# Patient Record
Sex: Female | Born: 1965 | Race: White | Hispanic: No | Marital: Married | Smoking: Never smoker
Health system: Southern US, Community
[De-identification: ages and names within clinical notes are randomized; demographics above are authoritative.]

## PROBLEM LIST (undated history)

## (undated) DIAGNOSIS — Z9889 Other specified postprocedural states: Secondary | ICD-10-CM

## (undated) DIAGNOSIS — T8859XA Other complications of anesthesia, initial encounter: Secondary | ICD-10-CM

## (undated) DIAGNOSIS — R112 Nausea with vomiting, unspecified: Secondary | ICD-10-CM

## (undated) DIAGNOSIS — K200 Eosinophilic esophagitis: Secondary | ICD-10-CM

## (undated) DIAGNOSIS — J189 Pneumonia, unspecified organism: Secondary | ICD-10-CM

## (undated) DIAGNOSIS — I10 Essential (primary) hypertension: Secondary | ICD-10-CM

## (undated) DIAGNOSIS — I719 Aortic aneurysm of unspecified site, without rupture: Secondary | ICD-10-CM

## (undated) DIAGNOSIS — F32A Depression, unspecified: Secondary | ICD-10-CM

## (undated) DIAGNOSIS — M797 Fibromyalgia: Secondary | ICD-10-CM

## (undated) DIAGNOSIS — H539 Unspecified visual disturbance: Secondary | ICD-10-CM

## (undated) DIAGNOSIS — R197 Diarrhea, unspecified: Secondary | ICD-10-CM

## (undated) DIAGNOSIS — G43909 Migraine, unspecified, not intractable, without status migrainosus: Secondary | ICD-10-CM

## (undated) HISTORY — DX: Aortic aneurysm of unspecified site, without rupture: I71.9

## (undated) HISTORY — DX: Migraine, unspecified, not intractable, without status migrainosus: G43.909

## (undated) HISTORY — DX: Diarrhea, unspecified: R19.7

## (undated) HISTORY — DX: Fibromyalgia: M79.7

## (undated) HISTORY — DX: Unspecified visual disturbance: H53.9

## (undated) HISTORY — DX: Depression, unspecified: F32.A

## (undated) HISTORY — PX: OTHER SURGICAL HISTORY: SHX169

## (undated) HISTORY — PX: ABDOMINAL HYSTERECTOMY: SHX81

## (undated) HISTORY — DX: Essential (primary) hypertension: I10

## (undated) HISTORY — PX: LAMINECTOMY: SHX219

---
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
## 1991-11-19 HISTORY — PX: APPENDECTOMY: SHX54

## 2002-11-18 HISTORY — PX: KNEE ARTHROSCOPY: SHX127

## 2002-11-18 HISTORY — PX: REPLACEMENT TOTAL KNEE: SUR1224

## 2009-06-26 DIAGNOSIS — M5030 Other cervical disc degeneration, unspecified cervical region: Secondary | ICD-10-CM

## 2009-06-26 DIAGNOSIS — A6000 Herpesviral infection of urogenital system, unspecified: Secondary | ICD-10-CM | POA: Insufficient documentation

## 2009-06-26 DIAGNOSIS — F3189 Other bipolar disorder: Secondary | ICD-10-CM

## 2009-06-26 DIAGNOSIS — M1990 Unspecified osteoarthritis, unspecified site: Secondary | ICD-10-CM | POA: Insufficient documentation

## 2009-06-26 DIAGNOSIS — K922 Gastrointestinal hemorrhage, unspecified: Secondary | ICD-10-CM

## 2009-06-26 DIAGNOSIS — G43909 Migraine, unspecified, not intractable, without status migrainosus: Secondary | ICD-10-CM | POA: Insufficient documentation

## 2009-06-26 DIAGNOSIS — M797 Fibromyalgia: Secondary | ICD-10-CM

## 2009-06-26 DIAGNOSIS — N958 Other specified menopausal and perimenopausal disorders: Secondary | ICD-10-CM

## 2009-06-26 HISTORY — DX: Other specified menopausal and perimenopausal disorders: N95.8

## 2009-06-26 HISTORY — DX: Herpesviral infection of urogenital system, unspecified: A60.00

## 2009-06-26 HISTORY — DX: Unspecified osteoarthritis, unspecified site: M19.90

## 2009-06-26 HISTORY — DX: Other bipolar disorder: F31.89

## 2009-06-26 HISTORY — DX: Migraine, unspecified, not intractable, without status migrainosus: G43.909

## 2009-06-26 HISTORY — DX: Gastrointestinal hemorrhage, unspecified: K92.2

## 2009-06-26 HISTORY — DX: Fibromyalgia: M79.7

## 2009-06-26 HISTORY — DX: Other cervical disc degeneration, unspecified cervical region: M50.30

## 2009-09-18 DIAGNOSIS — Z0000 Encounter for general adult medical examination without abnormal findings: Secondary | ICD-10-CM

## 2009-09-18 HISTORY — DX: Encounter for general adult medical examination without abnormal findings: Z00.00

## 2010-03-18 HISTORY — PX: OTHER SURGICAL HISTORY: SHX169

## 2010-05-23 DIAGNOSIS — I820 Budd-Chiari syndrome: Secondary | ICD-10-CM

## 2010-05-23 HISTORY — DX: Budd-Chiari syndrome: I82.0

## 2011-05-08 DIAGNOSIS — Z79899 Other long term (current) drug therapy: Secondary | ICD-10-CM

## 2011-05-08 HISTORY — DX: Other long term (current) drug therapy: Z79.899

## 2011-08-15 DIAGNOSIS — K120 Recurrent oral aphthae: Secondary | ICD-10-CM

## 2011-08-15 HISTORY — DX: Recurrent oral aphthae: K12.0

## 2011-09-10 DIAGNOSIS — E039 Hypothyroidism, unspecified: Secondary | ICD-10-CM

## 2011-09-10 HISTORY — DX: Hypothyroidism, unspecified: E03.9

## 2012-10-14 DIAGNOSIS — L709 Acne, unspecified: Secondary | ICD-10-CM

## 2012-10-14 HISTORY — DX: Acne, unspecified: L70.9

## 2012-11-18 HISTORY — PX: KNEE ARTHROSCOPY: SUR90

## 2012-11-18 HISTORY — PX: REPLACEMENT TOTAL KNEE: SUR1224

## 2013-02-03 DIAGNOSIS — G935 Compression of brain: Secondary | ICD-10-CM

## 2013-02-03 DIAGNOSIS — G243 Spasmodic torticollis: Secondary | ICD-10-CM

## 2013-02-03 HISTORY — DX: Spasmodic torticollis: G24.3

## 2013-02-03 HISTORY — DX: Compression of brain: G93.5

## 2013-11-18 HISTORY — PX: CARDIAC VALVE SURGERY: SHX40

## 2015-10-19 DIAGNOSIS — S060X9A Concussion with loss of consciousness of unspecified duration, initial encounter: Secondary | ICD-10-CM

## 2015-10-19 DIAGNOSIS — S060XAA Concussion with loss of consciousness status unknown, initial encounter: Secondary | ICD-10-CM

## 2015-10-19 HISTORY — DX: Concussion with loss of consciousness status unknown, initial encounter: S06.0XAA

## 2015-10-19 HISTORY — DX: Concussion with loss of consciousness of unspecified duration, initial encounter: S06.0X9A

## 2015-11-01 DIAGNOSIS — S069XAA Unspecified intracranial injury with loss of consciousness status unknown, initial encounter: Secondary | ICD-10-CM | POA: Insufficient documentation

## 2015-11-01 DIAGNOSIS — S069X9A Unspecified intracranial injury with loss of consciousness of unspecified duration, initial encounter: Secondary | ICD-10-CM | POA: Insufficient documentation

## 2015-11-01 HISTORY — DX: Unspecified intracranial injury with loss of consciousness status unknown, initial encounter: S06.9XAA

## 2015-11-01 HISTORY — DX: Unspecified intracranial injury with loss of consciousness of unspecified duration, initial encounter: S06.9X9A

## 2015-11-09 ENCOUNTER — Ambulatory Visit
Admission: RE | Admit: 2015-11-09 | Discharge: 2015-11-09 | Disposition: A | Discharge status: Home / Self Care | Payer: PRIVATE HEALTH INSURANCE | Source: Ambulatory Visit | Attending: Family Medicine | Admitting: Family Medicine

## 2015-11-09 DIAGNOSIS — R2681 Unsteadiness on feet: Secondary | ICD-10-CM | POA: Insufficient documentation

## 2015-11-09 DIAGNOSIS — S060X1D Concussion with loss of consciousness of 30 minutes or less, subsequent encounter: Secondary | ICD-10-CM | POA: Insufficient documentation

## 2015-11-09 DIAGNOSIS — R42 Dizziness and giddiness: Secondary | ICD-10-CM | POA: Insufficient documentation

## 2015-11-09 DIAGNOSIS — F0781 Postconcussional syndrome: Secondary | ICD-10-CM | POA: Insufficient documentation

## 2015-11-09 DIAGNOSIS — G44319 Acute post-traumatic headache, not intractable: Secondary | ICD-10-CM | POA: Insufficient documentation
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
## 2015-11-15 ENCOUNTER — Ambulatory Visit
Admission: RE | Admit: 2015-11-15 | Discharge: 2015-11-15 | Disposition: A | Discharge status: Home / Self Care | Payer: PRIVATE HEALTH INSURANCE | Source: Ambulatory Visit

## 2015-11-15 NOTE — Progress Notes (Signed)
[facility]  Physical Therapy Daily Note    Date: 11/15/2015  Patient: Michele Barajas  MR#: [id number]  Visit #: 2/10, TBD, POC through 02/10/16   Start Time: 0930 to Stop Time: 1029  Total Treatment Time: 54    Treatment Code # of Minutes Treatment Code untimed   Therapeutic Exercise  PT Evaluation    Neuromuscular Re-Ed  PT Re-Evaluation    Manual Therapy  Canalith Repositioning    Therapeutic Activities 54             Subjective: Patient is agreeable to participation in the therapy session.   I am doing better.  Post Concussive Symptom Scale (Pain assessment 0-6/6 scale):      Symptom Date   11/15/15 Date Date   Headache 4     Nausea 0     Vomitting 0     Balance problems 2     Dizziness 2 (getting up, moving around, climbing stairs)     Lightheadedness 2     Fatigue 3     Trouble falling asleep 0     Sleeping more than usual 2     Sleeping less than usual 0     Drowsiness 1     Sensitivity to light 0     Sensitivity to noise 0     Irritability 4     Sadness 0     Nervous / Anxious 0     Feeling more emotional 3     Numbness or tingling 0     Feeling slowed down 0     Difficulty concentrating 0     Difficulty remembering 3     Visual problems 0     Other      Total Score: 26, previously 66         '  Objective    Therapeutic Activity:    Functional Gait Assessment  Gait level surface 3   Change in gait speed 3   Gait with horizontal head turns 3   Gait with vertical head turns 3   Gait and pivot turns 3   Step over obstacle 3   Gait with narrow base of support 3   Gait with eyes closed 2 (veering to the right)   Ambulating backwards 3   Steps 3   Total Score 29/30   Scores < 23/30 indicate increased risk for falls.    Vestibular activities in standing: VOR horizontal / vertical  Cervical kinesthesia activities in seated  Perfect protocol 2.4 through 3.3.  Pt instruction on monitoring symptoms /  Concussion recovery / exertional activities.    Education/Home Exercise Program:  VOR x 1 vertical and  horizontal, Perfect protocol 2.4 through 3.3.. Daily walking 30 min per day monitoring symptoms. Recommended gradual return to driving: getting behind the wheel in the parking lot with husband in the car, driving 47-82 yards.     Assessment: VVAS 4, DHI 12 points   Cervical Kinesthesia Testing:  R rotation WNL degrees JPE  L rotation WNL degrees JPE  Upward tilt WNL degrees JPE  Downward tilt WNL degrees JPE      Increased dizziness with exertional activities.    DVA WNL 1 line loss.  Smooth pursuits WNL  Saccades horizontal WNL  Saccades vertical WNL  VOR x 1 horizontal increased dizziness after 30 sec at 2 Hz 4/10.  VOR x 1 vertical WNL for 30 seconds at 2 Hz    Follow up in two  weeks.    Progress Toward Functional Goals: STGs 3,4,5,6 met.     Patient Requires Continued Skilled Care to: Patient requires continued skilled Physical Therapy intervention to focus on balance, ROM, strength, activity tolerance, motion sensitivity,  to increase patients safety and independence with functional tasks.    Plan: continue with further treatment to focus on balance, gait, community mobility, bed mobility, transfers, ADL's, UE functioning, pain management, home exercise program  Continue per above Plan of care    Therapist Signature: Osie Cheeks, DPT  FA#[phone]

## 2015-11-23 ENCOUNTER — Ambulatory Visit: Payer: PRIVATE HEALTH INSURANCE

## 2015-11-23 ENCOUNTER — Ambulatory Visit: Payer: PRIVATE HEALTH INSURANCE | Admitting: Nurse Practitioner

## 2015-11-29 ENCOUNTER — Ambulatory Visit
Admission: RE | Admit: 2015-11-29 | Discharge: 2015-11-29 | Disposition: A | Discharge status: Home / Self Care | Payer: PRIVATE HEALTH INSURANCE | Source: Ambulatory Visit | Attending: Family Medicine | Admitting: Family Medicine

## 2015-11-29 DIAGNOSIS — R2681 Unsteadiness on feet: Secondary | ICD-10-CM | POA: Insufficient documentation

## 2015-11-29 DIAGNOSIS — G44319 Acute post-traumatic headache, not intractable: Secondary | ICD-10-CM | POA: Insufficient documentation

## 2015-11-29 DIAGNOSIS — R42 Dizziness and giddiness: Secondary | ICD-10-CM | POA: Insufficient documentation

## 2015-11-29 DIAGNOSIS — F0781 Postconcussional syndrome: Secondary | ICD-10-CM | POA: Insufficient documentation

## 2015-11-29 NOTE — Progress Notes (Signed)
[facility]  Physical Therapy Daily Note    Date: 11/29/2015  Patient: Michele Barajas  MR#: [id number]  Visit #: 3/10, auth TBD, POC through 02/10/16    Start Time: 1401 to Stop Time: 1435  Total Treatment Time: 30    Treatment Code # of Minutes Treatment Code untimed   Therapeutic Exercise  PT Evaluation    Neuromuscular Re-Ed  PT Re-Evaluation    Manual Therapy  Canalith Repositioning    Therapeutic Activities 30             Subjective: Patient is agreeable to participation in the therapy session.   Pt went to a wedding, long drive, and came back with significantly increased symptoms. Took over a week to recover. Pt reports her head is hurting again and it is worse. "it was massive overload", "yesterday was the first day I was able to eat a meal, things did not have any taste to them"        Post Concussive Symptom Scale (Pain assessment 0-6/6 scale):     Symptom Date   11/15/15 Date  11/29/15 Date   Headache 4 4.5    Nausea 0 0    Vomitting 0 0    Balance problems 2 4 (when walking)    Dizziness 2 (getting up, moving around, climbing stairs) 3.5    Lightheadedness 2 3.5    Fatigue 3 5.5    Trouble falling asleep 0 0    Sleeping more than usual 2 6 (pt sleeping close to 20 hours a day)    Sleeping less than usual 0 0    Drowsiness 1 4    Sensitivity to light 0 4    Sensitivity to noise 0 4    Irritability 4 6    Sadness 0 4    Nervous / Anxious 0 4.5    Feeling more emotional 3 4.5    Numbness or tingling 0 0    Feeling slowed down 0 3    Difficulty concentrating 0 4    Difficulty remembering 3 5    Visual problems 0 0    Other      Total Score: 26, previously 49 70                Objective    Therapeutic Activities:  Instruction on concussion recovery, activity modifications and pacing, pt instructed in decreasing activity level for one week with no shopping or going out to busy environments and decreasing  overall activity level, emphasized importance of daily walks twice daily for 15 min each time and deep breathing 3-4 times a day for 5 min, re-educated pt on nutritional recommendations for concussion recovery.       Education/Home Exercise Program:  Continue as given prior.    Assessment: Smooth pursuit, saccades, ROM impaired with pt decrease tolerance to activity, eyes watering, frequent blinking, report of object not appearing in focus and moving, increased eye fatigue with all activities. VOR   / motion sensitivity not assessed due to pt not tolerating today.     Progress Toward Functional Goals: working on LTGs 2 and 5.   Continue STG/LTG    Patient Requires Continued Skilled Care to: Patient requires continued skilled Physical Therapy intervention to focus on balance, motion sensitivity, oculomotor control and gaze stability to increase patients safety and independence with functional tasks.    Plan: continue with further treatment to focus on balance, gait, community mobility, bed mobility, transfers, ADL's, UE functioning, pain  management, home exercise program  Continue per above Plan of care    Therapist Signature: Osie Cheeks, DPT  ZO#[id number]

## 2015-12-19 NOTE — Progress Notes (Signed)
[facility]  Physical Therapy Daily Note    Date: 12/20/2015  Patient: Michele Barajas  MR#: [id number]  Visit #: 3/10, 30 visits allowed, POC through 02/10/16    Start Time: 1615 to Stop Time: 1648  Total Treatment Time: 25    Treatment Code # of Minutes Treatment Code untimed   Therapeutic Exercise  PT Evaluation    Neuromuscular Re-Ed  PT Re-Evaluation    Manual Therapy  Canalith Repositioning    Therapeutic Activities 25             Subjective: Patient is agreeable to participation in the therapy session.   Pt reports she is feeling confused, unable to remember how to get to the clinic . When trying to concentrate on regular daily things, unable to figure out daily IADLs: balancing check book, looking at billing statements.      Post Concussion Symptoms Scale 0 - 6  Symptom Date   11/15/15 Date  11/29/15 Date  12/20/15   Headache 4 4.5 5-6   Nausea 0 0 0   Vomitting 0 0 0   Balance problems 2 4 (when walking) 5   Dizziness 2 (getting up, moving around, climbing stairs) 3.5 5 (Pt reporting oscillopsia)    Lightheadedness 2 3.5 3   Fatigue 3 5.5 6   Trouble falling asleep 0 0 5   Sleeping more than usual 2 6 (pt sleeping close to 20 hours a day) 4 (sometimes sleeps the whole day)   Sleeping less than usual 0 0 4 (waking every hour, unable to fall asleep)   Drowsiness 1 4 5-6   Sensitivity to light 0 4 6   Sensitivity to noise 0 4 6   Irritability 4 6 3    Sadness 0 4 5   Nervous / Anxious 0 4.5 5   Feeling more emotional 3 4.5 3   Numbness or tingling 0 0 0   Feeling slowed down 0 3 4   Difficulty concentrating 0 4 6   Difficulty remembering 3 5 6    Visual problems 0 0 5 (seeing objects move)   Other      Total Score: 26, previously 49 70 94                     Objective    Therapeutic Activity: Continued education  on pacing activity, concussion recovery, importance of nutrition and activity restrictions after concussion    Education/Home Exercise Program: continue with current program     Assessment: Pt with significant increase in symptoms of headache for the last two weeks, was referred to Neurology for symptom management. Pt to return back to PT once symptoms of headache are more controlled. Pt to re-start daily walking when able / symtoms of headache are managed.     Progress Toward Functional Goals: working today on STGs 1 and 2.     Patient Requires Continued Skilled Care to: Patient requires continued skilled Physical Therapy intervention to focus on balance, ROM, strength, activity tolerance, motion sensitivity, to increase patients safety and independence with functional tasks.    Plan: continue with further treatment to focus on balance, gait, community mobility, bed mobility, transfers, ADL's, UE functioning, pain management, home exercise program  Continue per above Plan of care    Therapist Signature: Osie Cheeks, DPT [phone]#[id number]

## 2015-12-20 ENCOUNTER — Ambulatory Visit
Admission: RE | Admit: 2015-12-20 | Discharge: 2015-12-20 | Disposition: A | Discharge status: Home / Self Care | Payer: PRIVATE HEALTH INSURANCE | Source: Ambulatory Visit | Attending: Family Medicine | Admitting: Family Medicine

## 2015-12-20 DIAGNOSIS — R42 Dizziness and giddiness: Secondary | ICD-10-CM | POA: Insufficient documentation

## 2015-12-20 DIAGNOSIS — F0781 Postconcussional syndrome: Secondary | ICD-10-CM | POA: Insufficient documentation

## 2015-12-20 DIAGNOSIS — S060X1D Concussion with loss of consciousness of 30 minutes or less, subsequent encounter: Secondary | ICD-10-CM | POA: Insufficient documentation

## 2015-12-20 DIAGNOSIS — G44319 Acute post-traumatic headache, not intractable: Secondary | ICD-10-CM | POA: Insufficient documentation

## 2015-12-20 DIAGNOSIS — R2681 Unsteadiness on feet: Secondary | ICD-10-CM | POA: Insufficient documentation

## 2016-03-04 ENCOUNTER — Ambulatory Visit
Admission: RE | Admit: 2016-03-04 | Discharge: 2016-03-04 | Disposition: A | Discharge status: Home / Self Care | Payer: PRIVATE HEALTH INSURANCE | Source: Ambulatory Visit

## 2016-03-15 ENCOUNTER — Other Ambulatory Visit: Payer: Self-pay | Admitting: Critical Care Medicine

## 2016-03-15 DIAGNOSIS — J019 Acute sinusitis, unspecified: Secondary | ICD-10-CM

## 2016-04-17 ENCOUNTER — Ambulatory Visit: Payer: Self-pay

## 2016-04-23 ENCOUNTER — Ambulatory Visit
Admission: RE | Admit: 2016-04-23 | Discharge: 2016-04-23 | Disposition: A | Discharge status: Home / Self Care | Payer: Commercial Managed Care - POS | Source: Ambulatory Visit | Attending: Specialist | Admitting: Specialist

## 2016-04-23 DIAGNOSIS — S060X0D Concussion without loss of consciousness, subsequent encounter: Secondary | ICD-10-CM | POA: Insufficient documentation

## 2016-04-23 DIAGNOSIS — R41840 Attention and concentration deficit: Secondary | ICD-10-CM | POA: Insufficient documentation

## 2016-04-23 DIAGNOSIS — R41841 Cognitive communication deficit: Secondary | ICD-10-CM | POA: Insufficient documentation

## 2016-04-23 DIAGNOSIS — R41844 Frontal lobe and executive function deficit: Secondary | ICD-10-CM | POA: Insufficient documentation
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
## 2016-05-02 ENCOUNTER — Ambulatory Visit
Admission: RE | Admit: 2016-05-02 | Discharge: 2016-05-02 | Disposition: A | Discharge status: Home / Self Care | Payer: Commercial Managed Care - POS | Source: Ambulatory Visit

## 2016-05-02 NOTE — Progress Notes (Signed)
Speech Language Pathology Daily Note    Date: 05/02/2016  Patient: Michele Barajas  MR#: [id number]  Visit #: 2/12; Cert: 03/21/980 (60 Visits)    Start Time: 0930 to Stop Time: 1030  Total Treatment Time: 55    Subjective:   Patient is agreeable to participation in the therapy session. Patient arrived on time. No c/o pain. Pt reports that anxiety is reduced during this session due to companionship of emotional service dog.     Objective:         Cognition:  -pt reports decreased ability to complete functional tasks such as simple math, organizational skill (sorting paperwork), following conversations, etc.    Compensatory strategies:  -pt actively participates in education regarding attention and strategies to lesson the cognitive burden of tasks  -pt demonstrated current use of strategies   -pt informed about Brain Injury Services and will receive materials in the following session     Assessment: Patient presents with mod-sev cognitive deficits c/b decreased short term, long term, and working memory, thought organization, attention, and executive functioning. Pt with recent increase in anxiety regarding deficits. Pt participates in education of strategies well. Pt has decreased anxiety during today's session and stated that she was more positive about the outcome of therapy. Pt stated that she will inform extended family of the circumstances of communicative and cognitive therapy services now that she understands her status more fully.     Progress Toward Functional Goals: Patient is making progress toward cognitive-linguistic goals and memory/executive functioning/attention goals. Continue STG/LTG    Patient Requires Continued Skilled Care to:   Improve communication skills to allow patient to participate in community activities. Improve cognitive-linguistic skills to increase independence with implementation of compensatory strategies. Use compensatory techniques independently.    Plan/Education/Home Exercise  Program:  Continue with language exercises, cognitive linguistic exercises, and implementation of compensatory strategies. Continue speech follow up 2x per week.    Therapist Signature: Odella Aquas, SLP Student       I was present for and directed the plan of care for the patient.     Vanice Sarah, MS, CCC-SLP

## 2016-05-07 ENCOUNTER — Ambulatory Visit
Admission: RE | Admit: 2016-05-07 | Discharge: 2016-05-07 | Disposition: A | Discharge status: Home / Self Care | Payer: Commercial Managed Care - POS | Source: Ambulatory Visit

## 2016-05-07 NOTE — Progress Notes (Signed)
Speech Language Pathology Daily Note    Date: 05/07/2016  Patient: Michele Barajas  MR#: [id number]  Visit #: 3/12; Cert: 03/24/8468 (60 Visits)    Start Time: 1030 to Stop Time: 1130  Total Treatment Time: 55    Subjective:   Patient is agreeable to participation in the therapy session. Patient arrived on time. Pt reports that fatigue has been significantly higher this past weekend. Pt reports that headache increased during the session.    Objective:         Cognition:  -pt reports decreased ability to complete functional tasks such as simple math, organizational skill (sorting paperwork), following conversations, etc.     Compensatory strategies:  -pt actively participates in education regarding attention, executive functions and strategies to lesson the cognitive burden of tasks  -pt demonstrated current use of strategies to aid in executive functions and attention tasks   -pt provided with information about Brain Injury Services      Assessment: Patient presents with mod-sev cognitive deficits c/b decreased short term, long term, and working memory, thought organization, attention, and executive functioning. Pt with recent increase in anxiety regarding deficits. Pt participates in education of strategies well. Pt reported a feeling of guilt due to the inability to complete tasks at home. Pt reported a confusion about current level of functioning and was informed of the progression of therapy.     Progress Toward Functional Goals: Patient is making progress toward cognitive-linguistic goals and memory/executive functioning/attention goals. Continue STG/LTG    Patient Requires Continued Skilled Care to:   Improve communication skills to allow patient to participate in community activities. Improve cognitive-linguistic skills to increase independence with implementation of compensatory strategies. Use compensatory techniques independently.    Plan/Education/Home Exercise Program:  Continue with language exercises,  cognitive linguistic exercises, and implementation of compensatory strategies. Continue speech follow up 2x per week.    Therapist Signature: Odella Aquas, SLP Student       I was present for and directed the plan of care for the patient.     Vanice Sarah, MS, CCC-SLP

## 2016-05-09 ENCOUNTER — Ambulatory Visit
Admission: RE | Admit: 2016-05-09 | Discharge: 2016-05-09 | Disposition: A | Discharge status: Home / Self Care | Payer: Commercial Managed Care - POS | Source: Ambulatory Visit

## 2016-05-14 ENCOUNTER — Ambulatory Visit
Admission: RE | Admit: 2016-05-14 | Discharge: 2016-05-14 | Disposition: A | Discharge status: Home / Self Care | Payer: Commercial Managed Care - POS | Source: Ambulatory Visit

## 2016-05-14 NOTE — Progress Notes (Signed)
Speech Language Pathology Daily Note    Date: 05/14/2016  Patient: Michele Barajas  MR#: [id number]  Visit #: 4/12; Cert: 03/21/980 (60 Visits)    Start Time: 1030 to Stop Time: 1130  Total Treatment Time: 56    Subjective:   Patient is agreeable to participation in the therapy session. Patient arrived on time. Pt visibly upset following discussion of prognosis with other medical professional.     Objective:         Cognition:  -pt reports decreased ability to complete functional tasks such as simple math, organizational skill (sorting paperwork), following conversations, cleaning house to her standards, etc.     Compensatory strategies:  -pt actively participates in education regarding attention, executive functions and strategies to lesson the cognitive burden of tasks  -pt began creating list for tasks that need to be completed prior to moving in upcoming months  -pt required max cueing to organize thoughts and utilize compensatory strategies (writing down tasks)    Assessment: Patient presents with mod-sev cognitive deficits c/b decreased short term, long term, and working memory, thought organization, attention, and executive functioning. Pt with recent increase in anxiety regarding deficits. Pt participates in education of strategies well. Pt reported a feeling of guilt and stupidity due to the inability to complete tasks at home.     Progress Toward Functional Goals: Patient is making progress toward cognitive-linguistic goals and memory/executive functioning/attention goals. Continue STG/LTG    Patient Requires Continued Skilled Care to:   Improve communication skills to allow patient to participate in community activities. Improve cognitive-linguistic skills to increase independence with implementation of compensatory strategies. Use compensatory techniques independently.    Plan/Education/Home Exercise Program:  Continue with language exercises, cognitive linguistic exercises, and implementation of compensatory  strategies. Continue speech follow up 2x per week.    Therapist Signature: Odella Aquas, SLP Student       I was present for and directed the plan of care for the patient.     Vanice Sarah, MS, CCC-SLP

## 2016-05-16 ENCOUNTER — Ambulatory Visit: Payer: Commercial Managed Care - POS

## 2016-05-23 ENCOUNTER — Ambulatory Visit: Payer: Commercial Managed Care - POS

## 2016-05-28 ENCOUNTER — Ambulatory Visit
Admission: RE | Admit: 2016-05-28 | Discharge: 2016-05-28 | Disposition: A | Discharge status: Home / Self Care | Payer: Commercial Managed Care - POS | Source: Ambulatory Visit | Attending: Specialist | Admitting: Specialist

## 2016-05-28 DIAGNOSIS — S060X0D Concussion without loss of consciousness, subsequent encounter: Secondary | ICD-10-CM | POA: Insufficient documentation

## 2016-05-28 DIAGNOSIS — R41844 Frontal lobe and executive function deficit: Secondary | ICD-10-CM | POA: Insufficient documentation

## 2016-05-28 DIAGNOSIS — R41841 Cognitive communication deficit: Secondary | ICD-10-CM | POA: Insufficient documentation

## 2016-05-28 DIAGNOSIS — R41840 Attention and concentration deficit: Secondary | ICD-10-CM | POA: Insufficient documentation

## 2016-05-28 NOTE — Progress Notes (Signed)
Speech Language Pathology Daily Note    Date: 05/28/2016  Patient: Michele Barajas  MR#: [id number]  Visit #: 5/12; Cert: 12/27/5282 (60 Visits)    Start Time: 1030 to Stop Time: 1130  Total Treatment Time: 56    Subjective:   Patient is agreeable to participation in the therapy session. Patient arrived on time. Pt reported she has been sleeping a lot this week.     Objective:         Treatment Tasks:    Deduction Puzzle: 58% acc independently; 100% acc mod cueing  -pt required break 5 minutes into task and reported 4/10 headache  -pt independently implemented compensatory strategy of crossing out information to reduce cognitive load  -pt benefited from reminders to reread clues    Logic Links (A-3-4 chips): 70% acc independently; 90% acc mod cueing  -pt benefited from visual and verbal cues   -relatively fast processing speed    Fill in the Missing Letters: 66% acc independently; 100% acc mod cueing  Spelling: 83% acc independently; 93% acc mod cueing  -pt's processing and completion speed increased throughout task execution  -pt benefited from semantic and phonemic cues  -pt demonstrated sense of accomplishment upon completion of task and reported 0/10 headache following task    Compensatory strategies:  -pt actively participates in education regarding attention, executive functions and strategies to lesson the cognitive burden of tasks  -pt marks off tasks that were completed during the week and continues to prepare for move at the end of the month     Assessment: Patient presents with mod-sev cognitive deficits c/b decreased short term, long term, and working memory, thought organization, attention, and executive functioning. Pt with recent increase in anxiety regarding deficits. Pt participates in education of strategies well. Pt reported inability to make Thursday appointments due to fatigue from week. Pt attempts to get later time slot on Thursdays and will plan to cancel Thursday appointments until later slot  becomes available.     Progress Toward Functional Goals: Patient is making progress toward cognitive-linguistic goals and memory/executive functioning/attention goals. Continue STG/LTG    Patient Requires Continued Skilled Care to:   Improve communication skills to allow patient to participate in community activities. Improve cognitive-linguistic skills to increase independence with implementation of compensatory strategies. Use compensatory techniques independently.    Plan/Education/Home Exercise Program:  Continue with language exercises, cognitive linguistic exercises, and implementation of compensatory strategies. Continue speech follow up 2x per week.    Therapist Signature: Odella Aquas, SLP Student       I was present for and directed the plan of care for the patient.     Vanice Sarah, MS, CCC-SLP

## 2016-05-30 ENCOUNTER — Ambulatory Visit: Payer: Commercial Managed Care - POS

## 2016-06-04 ENCOUNTER — Ambulatory Visit
Admission: RE | Admit: 2016-06-04 | Discharge: 2016-06-04 | Disposition: A | Discharge status: Home / Self Care | Payer: Commercial Managed Care - POS | Source: Ambulatory Visit

## 2016-06-04 NOTE — Progress Notes (Signed)
Speech Language Pathology Daily Note    Date: 06/04/2016  Patient: Michele Barajas  MR#: [id number]  Visit #: 6/12; Cert: 03/21/980 (60 Visits)    Start Time: 1030 to Stop Time: 1130  Total Treatment Time: 55    Subjective:   Patient is agreeable to participation in the therapy session. Patient arrived on time. "I've been sleeping a lot" Pt demonstrated higher level of energy and more positive attitude during session.     Objective:         Treatment Tasks:    Logic Links (A): 70% acc independently; 90% acc mod cueing  -pt benefited from visual and verbal cues   -relatively fast processing speed  -headache stayed at a 5/10 reported at the beginning of the session    [place] (3-Step): 83% acc independently; 100% acc min cueing  -pt benefited from reminders to check her work  -"its hard to think backwards and vertically at the same time"  -fast processing   -pt reported that her headache increased to a 7/10    Compensatory strategies:  -pt actively participates in education regarding attention, executive functions and strategies to lesson the cognitive burden of tasks  -pt creates "Win List" to document the successes in each session and throughout the day, as a reminder of the progress on the "bad days"      Assessment: Patient presents with mod-sev cognitive deficits c/b decreased short term, long term, and working memory, thought organization, attention, and executive functioning. Pt with recent increase in anxiety regarding deficits. Pt participates in education of strategies well. Pt reported that she is started to see some progress, but that it is hard to see the "light at the end of the tunnel". Pt educated on the growth that she has made and the importance of realizing her progress. Pt reported that she is "wiped out" after sessions and then educated on the cognitive demand during treatment. Pt demonstrated understanding of fatigue triggers and purpose of tasks.     Progress Toward Functional Goals: Patient  is making progress toward cognitive-linguistic goals and memory/executive functioning/attention goals. Continue STG/LTG    Patient Requires Continued Skilled Care to:   Improve communication skills to allow patient to participate in community activities. Improve cognitive-linguistic skills to increase independence with implementation of compensatory strategies. Use compensatory techniques independently.    Plan/Education/Home Exercise Program:  Continue with language exercises, cognitive linguistic exercises, and implementation of compensatory strategies. Continue speech follow up 2x per week.    Therapist Signature: Odella Aquas, SLP Student       I was present for and directed the plan of care for the patient.     Vanice Sarah, MS, CCC-SLP

## 2016-06-06 ENCOUNTER — Ambulatory Visit: Payer: Commercial Managed Care - POS

## 2016-06-11 ENCOUNTER — Ambulatory Visit: Payer: Commercial Managed Care - POS

## 2016-06-13 ENCOUNTER — Ambulatory Visit: Payer: Commercial Managed Care - POS

## 2016-06-13 ENCOUNTER — Ambulatory Visit
Admission: RE | Admit: 2016-06-13 | Discharge: 2016-06-13 | Disposition: A | Discharge status: Home / Self Care | Payer: Commercial Managed Care - POS | Source: Ambulatory Visit

## 2016-06-18 ENCOUNTER — Ambulatory Visit
Admission: RE | Admit: 2016-06-18 | Discharge: 2016-06-18 | Disposition: A | Discharge status: Home / Self Care | Payer: Commercial Managed Care - POS | Source: Ambulatory Visit | Attending: Specialist | Admitting: Specialist

## 2016-06-18 DIAGNOSIS — S060X0D Concussion without loss of consciousness, subsequent encounter: Secondary | ICD-10-CM | POA: Insufficient documentation

## 2016-06-18 DIAGNOSIS — R41841 Cognitive communication deficit: Secondary | ICD-10-CM | POA: Insufficient documentation

## 2016-06-18 DIAGNOSIS — R41844 Frontal lobe and executive function deficit: Secondary | ICD-10-CM | POA: Insufficient documentation

## 2016-06-18 DIAGNOSIS — R41840 Attention and concentration deficit: Secondary | ICD-10-CM | POA: Insufficient documentation

## 2016-06-18 NOTE — Progress Notes (Signed)
Speech Language Pathology Daily Note    Date: 06/18/2016  Patient: Michele Barajas  MR#: [id number]  Visit #: 1/47; Cert: 06/19/9561 (60 Visits)    Start Time: 1030 to Stop Time: 1130  Total Treatment Time: 55    Subjective:   Patient is agreeable to participation in the therapy session. Patient arrived on time. Pt reports headache of 7/10, appears very tired. Pt reports for the past two weeks, her headaches have not been below a 7/10, mostly up to 9-10/10.     Objective:         Treatment Tasks:    Compensatory strategies:  -pt is currently in the process of moving houses; reports extremely high levels of stress  -pt educated on importance of taking breaks throughout the day to reduce headaches and other cognitive triggers    To do list:  -pt requires max cueing to write down to-do list instead of repeating verbally  -educated to set timer when completing tasks to ensure not wasting time  -educated regarding strategy to simplify tasks to complete them    Assessment: Patient presents with mod-sev cognitive deficits c/b decreased short term, long term, and working memory, thought organization, attention, and executive functioning. Pt with high levels of stress at this time which are affecting overall cognition. Pt appropriately attends to education regarding compensatory strategies.    Progress Toward Functional Goals: Patient is making progress toward cognitive-linguistic goals and memory/executive functioning/attention goals. Continue STG/LTG    Patient Requires Continued Skilled Care to:   Improve communication skills to allow patient to participate in community activities. Improve cognitive-linguistic skills to increase independence with implementation of compensatory strategies. Use compensatory techniques independently.    Plan/Education/Home Exercise Program:  Continue with language exercises, cognitive linguistic exercises, and implementation of compensatory strategies. Continue speech follow up 2x per  week.    Therapist Signature: Vanice Sarah, MS, CCC-SLP

## 2016-06-20 ENCOUNTER — Ambulatory Visit: Payer: Commercial Managed Care - POS

## 2016-06-25 ENCOUNTER — Ambulatory Visit: Payer: Commercial Managed Care - POS

## 2016-06-27 ENCOUNTER — Ambulatory Visit: Payer: Commercial Managed Care - POS

## 2016-07-02 ENCOUNTER — Ambulatory Visit: Payer: Commercial Managed Care - POS

## 2016-07-04 ENCOUNTER — Ambulatory Visit: Payer: Commercial Managed Care - POS

## 2016-07-09 ENCOUNTER — Ambulatory Visit: Payer: Commercial Managed Care - POS

## 2016-07-11 ENCOUNTER — Ambulatory Visit: Payer: Commercial Managed Care - POS

## 2016-07-16 ENCOUNTER — Ambulatory Visit: Payer: Commercial Managed Care - POS

## 2016-07-16 ENCOUNTER — Emergency Department: Payer: Commercial Managed Care - POS

## 2016-07-16 ENCOUNTER — Inpatient Hospital Stay: Payer: Commercial Managed Care - POS | Admitting: Internal Medicine

## 2016-07-16 ENCOUNTER — Inpatient Hospital Stay
Admission: EM | Admit: 2016-07-16 | Discharge: 2016-07-18 | DRG: 392 | Disposition: A | Discharge status: Home / Self Care | Payer: Commercial Managed Care - POS | Attending: Internal Medicine | Admitting: Internal Medicine

## 2016-07-16 DIAGNOSIS — K529 Noninfective gastroenteritis and colitis, unspecified: Secondary | ICD-10-CM

## 2016-07-16 DIAGNOSIS — D72829 Elevated white blood cell count, unspecified: Secondary | ICD-10-CM

## 2016-07-16 DIAGNOSIS — M797 Fibromyalgia: Secondary | ICD-10-CM | POA: Diagnosis present

## 2016-07-16 DIAGNOSIS — Z800 Family history of malignant neoplasm of digestive organs: Secondary | ICD-10-CM

## 2016-07-16 DIAGNOSIS — F4310 Post-traumatic stress disorder, unspecified: Secondary | ICD-10-CM | POA: Diagnosis present

## 2016-07-16 HISTORY — DX: Noninfective gastroenteritis and colitis, unspecified: K52.9

## 2016-07-16 HISTORY — DX: Elevated white blood cell count, unspecified: D72.829

## 2016-07-16 LAB — CBC AND DIFFERENTIAL
Absolute NRBC: 0 10*3/uL
Basophils Absolute Automated: 0.04 10*3/uL (ref 0.00–0.20)
Basophils Automated: 0.2 %
Eosinophils Absolute Automated: 0.01 10*3/uL (ref 0.00–0.70)
Eosinophils Automated: 0.1 %
Hematocrit: 41.1 % (ref 37.0–47.0)
Hgb: 14.2 g/dL (ref 12.0–16.0)
Immature Granulocytes Absolute: 0.11 10*3/uL — ABNORMAL HIGH
Immature Granulocytes: 0.6 %
Lymphocytes Absolute Automated: 0.76 10*3/uL (ref 0.50–4.40)
Lymphocytes Automated: 3.9 %
MCH: 32 pg (ref 28.0–32.0)
MCHC: 34.5 g/dL (ref 32.0–36.0)
MCV: 92.6 fL (ref 80.0–100.0)
MPV: 10 fL (ref 9.4–12.3)
Monocytes Absolute Automated: 1.56 10*3/uL — ABNORMAL HIGH (ref 0.00–1.20)
Monocytes: 8.1 %
Neutrophils Absolute: 16.83 10*3/uL — ABNORMAL HIGH (ref 1.80–8.10)
Neutrophils: 87.1 %
Nucleated RBC: 0 /100 WBC (ref 0.0–1.0)
Platelets: 251 10*3/uL (ref 140–400)
RBC: 4.44 10*6/uL (ref 4.20–5.40)
RDW: 12 % (ref 12–15)
WBC: 19.31 10*3/uL — ABNORMAL HIGH (ref 3.50–10.80)

## 2016-07-16 LAB — PT AND APTT
PT INR: 1
PT: 12.8 s (ref 12.6–15.0)
PTT: 28 s (ref 23–37)

## 2016-07-16 LAB — COMPREHENSIVE METABOLIC PANEL
ALT: 47 U/L (ref 0–55)
AST (SGOT): 48 U/L — ABNORMAL HIGH (ref 5–34)
Albumin/Globulin Ratio: 1.3 (ref 0.9–2.2)
Albumin: 3.5 g/dL (ref 3.5–5.0)
Alkaline Phosphatase: 141 U/L — ABNORMAL HIGH (ref 37–106)
Anion Gap: 10 (ref 5.0–15.0)
BUN: 20.7 mg/dL — ABNORMAL HIGH (ref 7.0–19.0)
Bilirubin, Total: 0.2 mg/dL (ref 0.2–1.2)
CO2: 26 mEq/L (ref 22–29)
Calcium: 8.9 mg/dL (ref 8.5–10.5)
Chloride: 103 mEq/L (ref 100–111)
Creatinine: 0.8 mg/dL (ref 0.6–1.0)
Globulin: 2.6 g/dL (ref 2.0–3.6)
Glucose: 124 mg/dL — ABNORMAL HIGH (ref 70–100)
Potassium: 4.6 mEq/L (ref 3.5–5.1)
Protein, Total: 6.1 g/dL (ref 6.0–8.3)
Sodium: 139 mEq/L (ref 136–145)

## 2016-07-16 LAB — URINALYSIS, REFLEX TO MICROSCOPIC EXAM IF INDICATED
Bilirubin, UA: NEGATIVE
Blood, UA: NEGATIVE
Glucose, UA: NEGATIVE
Ketones UA: NEGATIVE
Leukocyte Esterase, UA: NEGATIVE
Nitrite, UA: NEGATIVE
Protein, UR: NEGATIVE
Specific Gravity UA: 1.016 (ref 1.001–1.035)
Urine pH: 7 (ref 5.0–8.0)
Urobilinogen, UA: NORMAL mg/dL

## 2016-07-16 LAB — HEMOGLOBIN AND HEMATOCRIT, BLOOD
Hematocrit: 39.4 % (ref 37.0–47.0)
Hgb: 13.7 g/dL (ref 12.0–16.0)

## 2016-07-16 LAB — TYPE AND SCREEN
AB Screen Gel: NEGATIVE
ABO Rh: O POS

## 2016-07-16 LAB — GFR: EGFR: >60

## 2016-07-16 LAB — LIPASE: Lipase: <4 U/L — ABNORMAL LOW (ref 8–78)

## 2016-07-16 MED ORDER — ONDANSETRON HCL 4 MG/2ML IJ SOLN
4.0000 mg | Freq: Four times a day (QID) | INTRAMUSCULAR | Status: DC | PRN
Start: 2016-07-16 — End: 2016-07-18
  Administered 2016-07-16 – 2016-07-18 (×6): 4 mg via INTRAVENOUS
  Filled 2016-07-16 (×6): qty 2

## 2016-07-16 MED ORDER — ACETAMINOPHEN 325 MG PO TABS
650.0000 mg | ORAL_TABLET | Freq: Four times a day (QID) | ORAL | Status: DC | PRN
Start: 2016-07-16 — End: 2016-07-18
  Administered 2016-07-17: 650 mg via ORAL
  Filled 2016-07-16: qty 2

## 2016-07-16 MED ORDER — HYDROMORPHONE HCL 0.5 MG/0.5 ML IJ SOLN
0.5000 mg | INTRAMUSCULAR | Status: DC | PRN
Start: 2016-07-16 — End: 2016-07-16
  Filled 2016-07-16: qty 0.5

## 2016-07-16 MED ORDER — FAMOTIDINE 20 MG PO TABS
20.0000 mg | ORAL_TABLET | Freq: Two times a day (BID) | ORAL | Status: DC
Start: 2016-07-16 — End: 2016-07-18
  Administered 2016-07-16 – 2016-07-18 (×4): 20 mg via ORAL
  Filled 2016-07-16 (×4): qty 1

## 2016-07-16 MED ORDER — FAMOTIDINE 10 MG/ML IV SOLN (WRAP)
20.0000 mg | Freq: Two times a day (BID) | INTRAVENOUS | Status: DC
Start: 2016-07-16 — End: 2016-07-18
  Filled 2016-07-16 (×2): qty 2

## 2016-07-16 MED ORDER — PANTOPRAZOLE SODIUM 40 MG IV SOLR
40.0000 mg | Freq: Every day | INTRAVENOUS | Status: DC
Start: 2016-07-16 — End: 2016-07-16

## 2016-07-16 MED ORDER — DICYCLOMINE HCL 10 MG/ML IM SOLN
10.0000 mg | Freq: Once | INTRAMUSCULAR | Status: AC
Start: 2016-07-16 — End: 2016-07-16
  Administered 2016-07-16: 10 mg via INTRAMUSCULAR
  Filled 2016-07-16: qty 2

## 2016-07-16 MED ORDER — METRONIDAZOLE IN NACL 500 MG/100 ML IV SOLN
500.0000 mg | Freq: Once | INTRAVENOUS | Status: DC
Start: 2016-07-16 — End: 2016-07-16
  Filled 2016-07-16: qty 100

## 2016-07-16 MED ORDER — SODIUM CHLORIDE 0.9 % IV SOLN
INTRAVENOUS | Status: DC
Start: 2016-07-16 — End: 2016-07-18

## 2016-07-16 MED ORDER — IOHEXOL 350 MG/ML IV SOLN
100.0000 mL | Freq: Once | INTRAVENOUS | Status: AC | PRN
Start: 2016-07-16 — End: 2016-07-16
  Administered 2016-07-16: 100 mL via INTRAVENOUS

## 2016-07-16 MED ORDER — MORPHINE SULFATE 4 MG/ML IJ/IV SOLN (WRAP)
Status: AC
Start: 2016-07-16 — End: 2016-07-16
  Administered 2016-07-16: 4 mg via INTRAVENOUS
  Filled 2016-07-16: qty 1

## 2016-07-16 MED ORDER — MORPHINE SULFATE 4 MG/ML IJ/IV SOLN (WRAP)
4.0000 mg | Freq: Once | Status: AC
Start: 2016-07-16 — End: 2016-07-16
  Administered 2016-07-16: 4 mg via INTRAVENOUS
  Filled 2016-07-16: qty 1

## 2016-07-16 MED ORDER — MORPHINE SULFATE 4 MG/ML IJ/IV SOLN (WRAP)
4.0000 mg | Status: DC | PRN
Start: 2016-07-16 — End: 2016-07-18
  Administered 2016-07-16 – 2016-07-18 (×2): 4 mg via INTRAVENOUS
  Filled 2016-07-16 (×2): qty 1

## 2016-07-16 MED ORDER — SERTRALINE HCL 50 MG PO TABS
75.0000 mg | ORAL_TABLET | Freq: Every day | ORAL | Status: DC
Start: 2016-07-16 — End: 2016-07-18
  Administered 2016-07-16 – 2016-07-18 (×3): 75 mg via ORAL
  Filled 2016-07-16 (×3): qty 2

## 2016-07-16 MED ORDER — ACETAMINOPHEN 650 MG RE SUPP
650.0000 mg | RECTAL | Status: DC | PRN
Start: 2016-07-16 — End: 2016-07-18

## 2016-07-16 MED ORDER — LEVOFLOXACIN IN D5W 750 MG/150ML IV SOLN
750.0000 mg | Freq: Once | INTRAVENOUS | Status: DC
Start: 2016-07-16 — End: 2016-07-16

## 2016-07-16 MED ORDER — ACYCLOVIR 800 MG PO TABS
400.0000 mg | ORAL_TABLET | Freq: Two times a day (BID) | ORAL | Status: DC
Start: 2016-07-16 — End: 2016-07-18
  Administered 2016-07-16 – 2016-07-18 (×5): 400 mg via ORAL
  Filled 2016-07-16 (×9): qty 1

## 2016-07-16 MED ORDER — ONDANSETRON HCL 4 MG/2ML IJ SOLN
4.0000 mg | Freq: Four times a day (QID) | INTRAMUSCULAR | Status: DC | PRN
Start: 2016-07-16 — End: 2016-07-16

## 2016-07-16 MED ORDER — ONDANSETRON 4 MG PO TBDP
4.0000 mg | ORAL_TABLET | Freq: Four times a day (QID) | ORAL | Status: DC | PRN
Start: 2016-07-16 — End: 2016-07-18
  Filled 2016-07-16: qty 1

## 2016-07-16 MED ORDER — BUPROPION HCL ER (XL) 150 MG PO TB24
300.0000 mg | ORAL_TABLET | Freq: Every day | ORAL | Status: DC
Start: 2016-07-16 — End: 2016-07-18
  Administered 2016-07-17 – 2016-07-18 (×2): 300 mg via ORAL
  Filled 2016-07-16 (×2): qty 2
  Filled 2016-07-16: qty 1

## 2016-07-16 MED ORDER — CLONAZEPAM 0.5 MG PO TABS
1.0000 mg | ORAL_TABLET | Freq: Three times a day (TID) | ORAL | Status: DC
Start: 2016-07-16 — End: 2016-07-18
  Administered 2016-07-16 – 2016-07-18 (×6): 1 mg via ORAL
  Filled 2016-07-16 (×6): qty 2

## 2016-07-16 MED ORDER — MORPHINE SULFATE 2 MG/ML IJ/IV SOLN (WRAP)
2.0000 mg | Status: DC | PRN
Start: 2016-07-16 — End: 2016-07-16

## 2016-07-16 MED ORDER — DICYCLOMINE HCL 10 MG PO CAPS
10.0000 mg | ORAL_CAPSULE | Freq: Four times a day (QID) | ORAL | Status: DC
Start: 2016-07-16 — End: 2016-07-18
  Administered 2016-07-16 – 2016-07-18 (×8): 10 mg via ORAL
  Filled 2016-07-16 (×9): qty 1

## 2016-07-16 MED ORDER — LAMOTRIGINE 100 MG PO TABS
200.0000 mg | ORAL_TABLET | Freq: Two times a day (BID) | ORAL | Status: DC
Start: 2016-07-16 — End: 2016-07-18
  Administered 2016-07-16 – 2016-07-18 (×5): 200 mg via ORAL
  Filled 2016-07-16 (×6): qty 2

## 2016-07-16 MED ORDER — SODIUM CHLORIDE 0.9 % IV BOLUS
1000.0000 mL | Freq: Once | INTRAVENOUS | Status: AC
Start: 2016-07-16 — End: 2016-07-16
  Administered 2016-07-16: 1000 mL via INTRAVENOUS

## 2016-07-16 MED ORDER — NALOXONE HCL 0.4 MG/ML IJ SOLN (WRAP)
0.2000 mg | INTRAMUSCULAR | Status: DC | PRN
Start: 2016-07-16 — End: 2016-07-18

## 2016-07-16 MED ORDER — ACETAMINOPHEN 325 MG PO TABS
650.0000 mg | ORAL_TABLET | ORAL | Status: DC | PRN
Start: 2016-07-16 — End: 2016-07-18

## 2016-07-16 MED ORDER — RISAQUAD PO CAPS
1.0000 | ORAL_CAPSULE | Freq: Every day | ORAL | Status: DC
Start: 2016-07-16 — End: 2016-07-18
  Administered 2016-07-16 – 2016-07-18 (×3): 1 via ORAL
  Filled 2016-07-16 (×3): qty 1

## 2016-07-16 MED ORDER — ONDANSETRON HCL 4 MG/2ML IJ SOLN
4.0000 mg | Freq: Once | INTRAMUSCULAR | Status: AC
Start: 2016-07-16 — End: 2016-07-16
  Administered 2016-07-16: 4 mg via INTRAVENOUS
  Filled 2016-07-16: qty 2

## 2016-07-16 MED ORDER — HYDROMORPHONE HCL 1 MG/ML IJ SOLN
0.5000 mg | INTRAMUSCULAR | Status: DC | PRN
Start: 2016-07-16 — End: 2016-07-17
  Administered 2016-07-16 – 2016-07-17 (×3): 0.5 mg via INTRAVENOUS
  Filled 2016-07-16 (×3): qty 1

## 2016-07-16 NOTE — ED Provider Notes (Signed)
Physician/Midlevel provider first contact with patient: 07/16/16 1228         History     Chief Complaint   Patient presents with   . Rectal Bleeding   . Abdominal Pain     HPI Comments: 51 year old female here with abdominal pain, nausea, vomiting and diarrhea.  Last night patient had generalized abdominal pain, nausea, loose watery diarrhea.  This morning.  The diarrhea has gone to bright red blood per rectum and abdominal pain has worsened.  Patient was seen at urgent care, had lab work, IV fluid and given Demerol for pain and sent here for further evaluation.  Patient denies any recent sick contact, travel or antibiotic use.  Pain is diffuse but does not radiate to the back.  She denies any chest pain or shortness of breath.    The history is provided by the patient and the spouse. No language interpreter was used.            Past Medical History   Diagnosis Date   . Concussion    . Fibromyalgia        Past Surgical History   Procedure Laterality Date   . Hysterectomy     . Knee surgery     . Cervical fusion     . Ascending aortic aneurysm repair         History reviewed. No pertinent family history.    Social  Social History   Substance Use Topics   . Smoking status: Never Smoker    . Smokeless tobacco: Never Used   . Alcohol Use: Yes      Comment: socialluy       .     Allergies   Allergen Reactions   . Fentanyl        Home Medications     Last Medication Reconciliation Action:  In Progress Bryna Colander, RN 07/16/2016 12:29 PM                  buPROPion (WELLBUTRIN) 75 MG tablet     Take 75 mg by mouth 2 (two) times daily.     clonazePAM (KLONOPIN) 1 MG tablet     Take 1 mg by mouth 2 (two) times daily as needed.     lamoTRIgine (LAMICTAL) 100 MG tablet     Take 100 mg by mouth daily.     sertraline (ZOLOFT) 100 MG tablet     Take 100 mg by mouth daily.           Review of Systems   Constitutional: Negative.  Negative for fever and fatigue.   HENT: Negative.    Eyes: Negative.    Respiratory: Negative.   Negative for chest tightness and shortness of breath.    Cardiovascular: Negative.  Negative for chest pain.   Gastrointestinal: Positive for nausea, vomiting, abdominal pain, diarrhea and anal bleeding.   Genitourinary: Negative.    Musculoskeletal: Negative.    Skin: Negative.    Neurological: Negative.    Hematological: Negative.    Psychiatric/Behavioral: Negative.    All other systems reviewed and are negative.      Physical Exam    BP: 138/62 mmHg, Heart Rate: 80, Temp: 97.7 F (36.5 C), Resp Rate: 20, SpO2: 95 %, Weight: 90.719 kg    Physical Exam   Constitutional: She is oriented to person, place, and time. She appears well-developed and well-nourished.   HENT:   Head: Normocephalic and atraumatic.   Neck: Normal range of motion.  Neck supple.   Cardiovascular: Normal rate, regular rhythm, normal heart sounds and intact distal pulses.    Pulmonary/Chest: Effort normal and breath sounds normal.   Abdominal: Soft. Normal appearance and bowel sounds are normal. She exhibits no distension and no mass. There is generalized tenderness. There is no rebound and no guarding. No hernia.   Genitourinary:   Bright red blood   Musculoskeletal: Normal range of motion.   Neurological: She is alert and oriented to person, place, and time.   Skin: Skin is warm and dry.   Psychiatric: She has a normal mood and affect. Her behavior is normal. Judgment and thought content normal.   Nursing note and vitals reviewed.        MDM and ED Course     ED Medication Orders     Start Ordered     Status Ordering Provider    07/16/16 1647 07/16/16 1646  metroNIDAZOLE (FLAGYL) 500 mg IVPB (premix)   Once     Route: Intravenous  Ordered Dose: 500 mg     Ordered Yarissa Reining, [facility]ANG    07/16/16 1647 07/16/16 1646  levoFLOXacin (LEVAQUIN) 750mg  in D5W IVPB (premix)   Once     Route: Intravenous  Ordered Dose: 750 mg     Ordered Keyasha Miah, [facility]ANG    07/16/16 1518 07/16/16 1517  0.9%  NaCl infusion   Continuous     Route: Intravenous      Last MAR action:  New Bag Kameka Whan, [facility]ANG    07/16/16 1455 07/16/16 1454  morphine injection 4 mg   Once     Route: Intravenous  Ordered Dose: 4 mg     Last MAR action:  Given Ozias Dicenzo, [facility]ANG    07/16/16 1237 07/16/16 1236  sodium chloride 0.9 % bolus 1,000 mL   Once     Route: Intravenous  Ordered Dose: 1,000 mL     Last MAR action:  Stopped Tiye Huwe, [facility] HOANG    07/16/16 1237 07/16/16 1236  ondansetron (ZOFRAN) injection 4 mg   Once     Route: Intravenous  Ordered Dose: 4 mg     Last MAR action:  Given Rosemarie Galvis, [facility] HOANG    07/16/16 1237 07/16/16 1236  morphine injection 4 mg   Once     Route: Intravenous  Ordered Dose: 4 mg     Last MAR action:  Given Melonee Gerstel, [facility]ANG    07/16/16 1237 07/16/16 1236  dicyclomine (BENTYL) injection 10 mg   Once     Route: Intramuscular  Ordered Dose: 10 mg     Last MAR action:  Given Kalyse Meharg HOANG             MDM  Number of Diagnoses or Management Options  Hemorrhagic colitis:   Leukocytosis, unspecified type:   Diagnosis management comments: 2:30 PM spoke with Shana NP for GI, recommend treatment with antibiotics with Levaquin and Flagyl.    *This note was generated by the Epic EMR system/ Dragon speech recognition and may contain inherent errors or omissions not intended by the user. Grammatical errors, random word insertions, deletions, pronoun errors and incomplete sentences are occasional consequences of this technology due to software limitations. Not all errors are caught or corrected. If there are questions or concerns about the content of this note or information contained within the body of this dictation they should be addressed directly with the author for clarification    The attending signature signifies review and agreement of  the history , PE, evaluation, clinical impression and discharge plan except as otherwise noted.    I, Prospero Mahnke Albertine Grates, have been the primary provider for Michele Barajas during this Emergency Dept visit.    I reviewed nursing  recorded vitals and history including PMSFHX    Oxygen saturation by pulse oximetry is 95%-100%, Normal.  Interventions: None Needed.    Plan:  Patient will be admitted to the hospital for IV antibiotics, rehydration, pain control, as well as repeat blood work to evaluate for anemia.         Amount and/or Complexity of Data Reviewed  Clinical lab tests: ordered and reviewed  Tests in the radiology section of CPT: ordered and reviewed  Tests in the medicine section of CPT: ordered and reviewed  Review and summarize past medical records: yes  Discuss the patient with other providers: yes    Risk of Complications, Morbidity, and/or Mortality  Presenting problems: high  Diagnostic procedures: high  Management options: high    Patient Progress  Patient progress: stable            Procedures    Results     Procedure Component Value Units Date/Time    Stool for C. diff [[id number]] Collected:  07/16/16 1525    Specimen Information:  Stool from Stool Updated:  07/16/16 1525    Narrative:      Patient's current admission began:->less than or equal to 3  days ago  Cancel C-Diff order if no diarrhea in 12 hours?->Yes  DELAY AFFECTS RESULT    Stool for Salm/Shig/Campy/Shiga PCR [[id number]] Collected:  07/16/16 1525    Specimen Information:  Stool Updated:  07/16/16 1525    UA with reflex to micro (pts  3 + yrs) [[id number]]  (Abnormal) Collected:  07/16/16 1417    Specimen Information:  Urine Updated:  07/16/16 1428     Urine Type Clean Catch      Color, UA Yellow      Clarity, UA Cloudy (A)      Specific Gravity UA 1.016      Urine pH 7.0      Leukocyte Esterase, UA Negative      Nitrite, UA Negative      Protein, UR Negative      Glucose, UA Negative      Ketones UA Negative      Urobilinogen, UA Normal mg/dL      Bilirubin, UA Negative      Blood, UA Negative     Type and Screen [[id number]] Collected:  07/16/16 1258    Specimen Information:  Blood Updated:  07/16/16 1418     ABO Rh O POS      AB Screen Gel NEG     PT/APTT  [[id number]] Collected:  07/16/16 1258     PT 12.8 sec Updated:  07/16/16 1327     PT INR 1.0      PT Anticoag. Given Within 48 hrs. None      PTT 28 sec     Comprehensive metabolic panel [[id number]]  (Abnormal) Collected:  07/16/16 1258    Specimen Information:  Blood Updated:  07/16/16 1322     Glucose 124 (H) mg/dL      BUN 74.2 (H) mg/dL      Creatinine 0.8 mg/dL      Sodium 595 mEq/L      Potassium 4.6 mEq/L      Chloride 103 mEq/L      CO2 26 mEq/L  Calcium 8.9 mg/dL      Protein, Total 6.1 g/dL      Albumin 3.5 g/dL      AST (SGOT) 48 (H) U/L      ALT 47 U/L      Alkaline Phosphatase 141 (H) U/L      Bilirubin, Total 0.2 mg/dL      Globulin 2.6 g/dL      Albumin/Globulin Ratio 1.3      Anion Gap 10.0     Lipase [[id number]]  (Abnormal) Collected:  07/16/16 1258    Specimen Information:  Blood Updated:  07/16/16 1322     Lipase <4 (L) U/L     GFR [[id number]] Collected:  07/16/16 1258     EGFR >60.0 Updated:  07/16/16 1322    CBC with differential [[id number]]  (Abnormal) Collected:  07/16/16 1258    Specimen Information:  Blood from Blood Updated:  07/16/16 1307     WBC 19.31 (H) x10 3/uL      Hgb 14.2 g/dL      Hematocrit 25.9 %      Platelets 251 x10 3/uL      RBC 4.44 x10 6/uL      MCV 92.6 fL      MCH 32.0 pg      MCHC 34.5 g/dL      RDW 12 %      MPV 10.0 fL      Neutrophils 87.1 %      Lymphocytes Automated 3.9 %      Monocytes 8.1 %      Eosinophils Automated 0.1 %      Basophils Automated 0.2 %      Immature Granulocyte 0.6 %      Nucleated RBC 0.0 /100 WBC      Neutrophils Absolute 16.83 (H) x10 3/uL      Abs Lymph Automated 0.76 x10 3/uL      Abs Mono Automated 1.56 (H) x10 3/uL      Abs Eos Automated 0.01 x10 3/uL      Absolute Baso Automated 0.04 x10 3/uL      Absolute Immature Granulocyte 0.11 (H) x10 3/uL      Absolute NRBC 0.00 x10 3/uL           Radiology Results (24 Hour)     Procedure Component Value Units Date/Time    CT Abd/Pelvis with IV Contrast only [[id number]] Collected:  07/16/16 1406     Order Status:  Completed Updated:  07/16/16 1414    Narrative:      Indication: Abdominal pain.    Procedure: Contrast-enhanced CT of abdomen and pelvis. 100 mL of  Omnipaque 350 were administered intravenously. No oral contrast  material.    Findings: There is severe thickening of the wall of the colon involving  the entirety of the colon. There are no abnormally dilated colonic  loops. Minimal stranding of the pericolonic fatty tissue. No colonic  diverticula are identified. No evidence of diverticulitis. The appendix  is not identified. There is no evidence of appendicitis, however. No  dilated small bowel loops. No free intraperitoneal gas or free fluid.  The uterus is absent. No adnexal masses or cystic lesions are seen.  Unremarkable liver and gallbladder. No dilatation of the biliary tree.  Unremarkable spleen, pancreas, adrenals, kidneys, and abdominal aorta.  Normal cardiac size. Sternal wires are present. There is an old, healed  fracture of the posterolateral portion of the left seventh rib.  Impression:        1. Severe diffuse colonic wall thickening consistent with colitis. No  evidence of diverticulitis.  2. Hysterectomy.    Wynema Birch, MD   07/16/2016 2:10 PM            I have reviewed all labs and/or radiological studies. I have reviewed all xrays if any myself on the PACS system.    Clinical Impression & Disposition     Clinical Impression  Final diagnoses:   Hemorrhagic colitis   Leukocytosis, unspecified type        ED Disposition     Observation Admitting Physician: Bertrum Sol [[id number]]  Diagnosis: Colitis [130865]  Estimated Length of Stay: < 2 midnights  Tentative Discharge Plan?: Home or Self Care [1]  Patient Class: Observation [104]             New Prescriptions    No medications on file                   Carney Harder [place], [place]  07/16/16 1647    Kalman Drape, MD  07/20/16 1355

## 2016-07-16 NOTE — ED Notes (Signed)
Weapons denied

## 2016-07-16 NOTE — Progress Notes (Signed)
Received pt from ER via stretcher. Pt oriented to room and call bell. No Telemetry orders at this time.

## 2016-07-16 NOTE — H&P (Addendum)
[facility] HOSPITALIST  H&P  Patient Info:   Date/Time: 07/16/2016 / 6:56 PM   Admit Date:07/16/2016  Patient Name:Michele Barajas   IHK:[id number]   PCP: Christa See, MD  Attending Physician:Wallo, Rolly Pancake, MD     Assessment and Plan:   50 year old with past medical history of PTSD, fibromyalgia, comes in with half a day of severe abdominal cramps, nausea, vomiting and hemorrhagic diarrhea with CT evidence of diffuse colitis     ID: Hemorrhagic Colitis with nausea, vomiting: Likely infectious etiology; continue supportive care with IV fluids, antiemetics.  Hold off on IV antibiotics for now. Discussed case with Dr Janalyn Rouse who informed me that there are a lot of cases of Shigella going around and if abx are started pt could be at risk of HUS.  If pt gets worse, will consult Dr Janalyn Rouse.   Check stool cultures for Shigella, Salmonella and Campylobacter.  Check C. difficile as well.  Placed on clear liquids for now   Inadequate pain control: Diffuse cramping pain from above: Start Dilaudid 0.5 every 4 when necessary   Leukocytosis due to above.  We will recheck tomorrow        DVT Prohylaxis:SEDs   Code Status: No Order  Disposition:home  Type of Admission:Observation  Estimated Length of Stay (including stay in the ER receiving treatment): 1-2  Medical Necessity for stay: Hemorrhagic colitis  Hospital Problems:   Active Problems:    Colitis    Clinical Presentation:   History of Presenting Illness:   Michele Barajas is a 50 y.o. female who has history of   Past Surgical History   Procedure Laterality Date   . Hysterectomy     . Knee surgery     . Cervical fusion     . Ascending aortic aneurysm repair      Past Medical History   Diagnosis Date   . Concussion    . Fibromyalgia     came with the chief complaint of Rectal Bleeding and Abdominal Pain  Symptoms started 1 AM with severe nausea, vomiting and diarrhea.  Diarrhea persisted.  Has had multiple episodes over 30.  Diarrhea, change to  bloody form.  Also complaining of severe cramping pain in her lower abdomen.  No fever, chills, no chest pain or difficulty breathing.  Did not eat anything unusual yesterday ate [employer].  No other sick contacts  Review of Systems:   Review of Systems   Constitutional: Positive for malaise/fatigue. Negative for fever and chills.   Respiratory: Negative for cough, hemoptysis, sputum production and shortness of breath.    Cardiovascular: Negative for chest pain and palpitations.   Gastrointestinal: Positive for nausea, vomiting, abdominal pain, diarrhea and blood in stool.   Neurological: Positive for weakness.     Physical Exam:     Filed Vitals:    07/16/16 1634 07/16/16 1636 07/16/16 1755 07/16/16 1820   BP:  118/70  137/71   Pulse:  76  93   Temp:  98 F (36.7 C)  98.2 F (36.8 C)   TempSrc:  Temporal Artery  Temporal Artery   Resp:  18  18   Height:       Weight:       SpO2: 96%  95% 93%     Physical Exam   Constitutional: She is oriented to person, place, and time and well-developed, well-nourished, and in no distress.   HENT:   Mouth/Throat: Mucous membranes are dry.   Cardiovascular: Normal rate and regular  rhythm.    Pulmonary/Chest: No respiratory distress. She has no wheezes. She has no rales.   Abdominal: Soft. Bowel sounds are normal. She exhibits no distension. There is tenderness. There is no rebound.   Tender on deep palpation in lower abdomen   Musculoskeletal: She exhibits no edema.   Neurological: She is alert and oriented to person, place, and time.     Clinical Information and History:   Chief Complaint:  Chief Complaint   Patient presents with   . Rectal Bleeding   . Abdominal Pain     Past Medical History:  Past Medical History   Diagnosis Date   . Concussion    . Fibromyalgia      Past Surgical History:  Past Surgical History   Procedure Laterality Date   . Hysterectomy     . Knee surgery     . Cervical fusion     . Ascending aortic aneurysm repair       Family History:Parents had colon  cancer  Social History:  History   Alcohol Use   . Yes     Comment: socialluy     History   Drug Use No     History   Smoking status   . Never Smoker    Smokeless tobacco   . Never Used     Social History     Social History   . Marital Status: Married     Spouse Name: N/A   . Number of Children: N/A   . Years of Education: N/A     Social History Main Topics   . Smoking status: Never Smoker    . Smokeless tobacco: Never Used   . Alcohol Use: Yes      Comment: socialluy   . Drug Use: No   . Sexual Activity: Not Asked     Other Topics Concern   . None     Social History Narrative   . None     Allergies:  Allergies   Allergen Reactions   . Fentanyl      Medications:  (Not in a hospital admission)  Results of Labs/imaging   Labs have been reviewed:   Coagulation Profile:   Recent Labs  Lab 07/16/16  1258   PT 12.8   PT INR 1.0   PTT 28       CBC review:   Recent Labs  Lab 07/16/16  1258   WBC 19.31*   HGB 14.2   HEMATOCRIT 41.1   PLATELETS 251   MCV 92.6   RDW 12   NEUTROPHILS 87.1   LYMPHOCYTES AUTOMATED 3.9   EOSINOPHILS AUTOMATED 0.1   IMMATURE GRANULOCYTE 0.6   NEUTROPHILS ABSOLUTE 16.83*   ABSOLUTE IMMATURE GRANULOCYTE 0.11*     Chem Review:  Recent Labs  Lab 07/16/16  1258   SODIUM 139   POTASSIUM 4.6   CHLORIDE 103   CO2 26   BUN 20.7*   CREATININE 0.8   GLUCOSE 124*   CALCIUM 8.9   BILIRUBIN, TOTAL 0.2   AST (SGOT) 48*   ALT 47   ALKALINE PHOSPHATASE 141*     Results     Procedure Component Value Units Date/Time    Stool for C. diff [[id number]] Collected:  07/16/16 1525    Specimen Information:  Stool from Stool Updated:  07/16/16 1525    Narrative:      Patient's current admission began:->less than or equal to 3  days ago  Cancel C-Diff order  if no diarrhea in 12 hours?->Yes  DELAY AFFECTS RESULT    Stool for Salm/Shig/Campy/Shiga PCR [[id number]] Collected:  07/16/16 1525    Specimen Information:  Stool Updated:  07/16/16 1525    UA with reflex to micro (pts  3 + yrs) [[id number]]  (Abnormal) Collected:  07/16/16  1417    Specimen Information:  Urine Updated:  07/16/16 1428     Urine Type Clean Catch      Color, UA Yellow      Clarity, UA Cloudy (A)      Specific Gravity UA 1.016      Urine pH 7.0      Leukocyte Esterase, UA Negative      Nitrite, UA Negative      Protein, UR Negative      Glucose, UA Negative      Ketones UA Negative      Urobilinogen, UA Normal mg/dL      Bilirubin, UA Negative      Blood, UA Negative     Type and Screen [[id number]] Collected:  07/16/16 1258    Specimen Information:  Blood Updated:  07/16/16 1418     ABO Rh O POS      AB Screen Gel NEG     PT/APTT [[id number]] Collected:  07/16/16 1258     PT 12.8 sec Updated:  07/16/16 1327     PT INR 1.0      PT Anticoag. Given Within 48 hrs. None      PTT 28 sec     Comprehensive metabolic panel [[id number]]  (Abnormal) Collected:  07/16/16 1258    Specimen Information:  Blood Updated:  07/16/16 1322     Glucose 124 (H) mg/dL      BUN 74.2 (H) mg/dL      Creatinine 0.8 mg/dL      Sodium 595 mEq/L      Potassium 4.6 mEq/L      Chloride 103 mEq/L      CO2 26 mEq/L      Calcium 8.9 mg/dL      Protein, Total 6.1 g/dL      Albumin 3.5 g/dL      AST (SGOT) 48 (H) U/L      ALT 47 U/L      Alkaline Phosphatase 141 (H) U/L      Bilirubin, Total 0.2 mg/dL      Globulin 2.6 g/dL      Albumin/Globulin Ratio 1.3      Anion Gap 10.0     Lipase [[id number]]  (Abnormal) Collected:  07/16/16 1258    Specimen Information:  Blood Updated:  07/16/16 1322     Lipase <4 (L) U/L     GFR [[id number]] Collected:  07/16/16 1258     EGFR >60.0 Updated:  07/16/16 1322    CBC with differential [[id number]]  (Abnormal) Collected:  07/16/16 1258    Specimen Information:  Blood from Blood Updated:  07/16/16 1307     WBC 19.31 (H) x10 3/uL      Hgb 14.2 g/dL      Hematocrit 09.3 %      Platelets 251 x10 3/uL      RBC 4.44 x10 6/uL      MCV 92.6 fL      MCH 32.0 pg      MCHC 34.5 g/dL      RDW 12 %      MPV 10.0 fL      Neutrophils 87.1 %  Lymphocytes Automated 3.9 %      Monocytes 8.1 %       Eosinophils Automated 0.1 %      Basophils Automated 0.2 %      Immature Granulocyte 0.6 %      Nucleated RBC 0.0 /100 WBC      Neutrophils Absolute 16.83 (H) x10 3/uL      Abs Lymph Automated 0.76 x10 3/uL      Abs Mono Automated 1.56 (H) x10 3/uL      Abs Eos Automated 0.01 x10 3/uL      Absolute Baso Automated 0.04 x10 3/uL      Absolute Immature Granulocyte 0.11 (H) x10 3/uL      Absolute NRBC 0.00 x10 3/uL         Radiology reports have been reviewed:  Radiology Results (24 Hour)     Procedure Component Value Units Date/Time    CT Abd/Pelvis with IV Contrast only [[id number]] Collected:  07/16/16 1406    Order Status:  Completed Updated:  07/16/16 1414    Narrative:      Indication: Abdominal pain.    Procedure: Contrast-enhanced CT of abdomen and pelvis. 100 mL of  Omnipaque 350 were administered intravenously. No oral contrast  material.    Findings: There is severe thickening of the wall of the colon involving  the entirety of the colon. There are no abnormally dilated colonic  loops. Minimal stranding of the pericolonic fatty tissue. No colonic  diverticula are identified. No evidence of diverticulitis. The appendix  is not identified. There is no evidence of appendicitis, however. No  dilated small bowel loops. No free intraperitoneal gas or free fluid.  The uterus is absent. No adnexal masses or cystic lesions are seen.  Unremarkable liver and gallbladder. No dilatation of the biliary tree.  Unremarkable spleen, pancreas, adrenals, kidneys, and abdominal aorta.  Normal cardiac size. Sternal wires are present. There is an old, healed  fracture of the posterolateral portion of the left seventh rib.      Impression:        1. Severe diffuse colonic wall thickening consistent with colitis. No  evidence of diverticulitis.  2. Hysterectomy.    Wynema Birch, MD   07/16/2016 2:10 PM          EKG: Not done  Last EKG Result     None           Hospitalist   Signed by:   Bertrum Sol  07/16/2016 6:56  PM    *This note was generated by the Epic EMR system/ Dragon speech recognition and may contain inherent errors or omissions not intended by the user. Grammatical errors, random word insertions, deletions, pronoun errors and incomplete sentences are occasional consequences of this technology due to software limitations. Not all errors are caught or corrected. If there are questions or concerns about the content of this note or information contained within the body of this dictation they should be addressed directly with the author for clarification

## 2016-07-17 ENCOUNTER — Encounter (INDEPENDENT_AMBULATORY_CARE_PROVIDER_SITE_OTHER): Payer: Self-pay

## 2016-07-17 ENCOUNTER — Observation Stay: Payer: Commercial Managed Care - POS

## 2016-07-17 DIAGNOSIS — D72829 Elevated white blood cell count, unspecified: Secondary | ICD-10-CM | POA: Insufficient documentation

## 2016-07-17 LAB — CBC AND DIFFERENTIAL
Absolute NRBC: 0 10*3/uL
Absolute NRBC: 0 10*3/uL
Basophils Absolute Automated: 0.04 10*3/uL (ref 0.00–0.20)
Basophils Absolute Automated: 0.03 10*3/uL (ref 0.00–0.20)
Basophils Automated: 0.2 %
Basophils Automated: 0.2 %
Eosinophils Absolute Automated: 0.01 10*3/uL (ref 0.00–0.70)
Eosinophils Absolute Automated: 0.03 10*3/uL (ref 0.00–0.70)
Eosinophils Automated: 0.1 %
Eosinophils Automated: 0.2 %
Hematocrit: 38.7 % (ref 37.0–47.0)
Hematocrit: 37.3 % (ref 37.0–47.0)
Hgb: 13.2 g/dL (ref 12.0–16.0)
Hgb: 12.8 g/dL (ref 12.0–16.0)
Immature Granulocytes Absolute: 0.1 10*3/uL — ABNORMAL HIGH
Immature Granulocytes Absolute: 0.13 10*3/uL — ABNORMAL HIGH
Immature Granulocytes: 0.5 %
Immature Granulocytes: 0.7 %
Lymphocytes Absolute Automated: 0.86 10*3/uL (ref 0.50–4.40)
Lymphocytes Absolute Automated: 1.16 10*3/uL (ref 0.50–4.40)
Lymphocytes Automated: 4.5 %
Lymphocytes Automated: 6.2 %
MCH: 31.9 pg (ref 28.0–32.0)
MCH: 32.1 pg — ABNORMAL HIGH (ref 28.0–32.0)
MCHC: 34.1 g/dL (ref 32.0–36.0)
MCHC: 34.3 g/dL (ref 32.0–36.0)
MCV: 93.5 fL (ref 80.0–100.0)
MCV: 93.5 fL (ref 80.0–100.0)
MPV: 10.3 fL (ref 9.4–12.3)
MPV: 10.1 fL (ref 9.4–12.3)
Monocytes Absolute Automated: 1.67 10*3/uL — ABNORMAL HIGH (ref 0.00–1.20)
Monocytes Absolute Automated: 1.83 10*3/uL — ABNORMAL HIGH (ref 0.00–1.20)
Monocytes: 8.7 %
Monocytes: 9.8 %
Neutrophils Absolute: 16.53 10*3/uL — ABNORMAL HIGH (ref 1.80–8.10)
Neutrophils Absolute: 15.54 10*3/uL — ABNORMAL HIGH (ref 1.80–8.10)
Neutrophils: 86 %
Neutrophils: 82.9 %
Nucleated RBC: 0 /100 WBC (ref 0.0–1.0)
Nucleated RBC: 0 /100 WBC (ref 0.0–1.0)
Platelets: 257 10*3/uL (ref 140–400)
Platelets: 231 10*3/uL (ref 140–400)
RBC: 4.14 10*6/uL — ABNORMAL LOW (ref 4.20–5.40)
RBC: 3.99 10*6/uL — ABNORMAL LOW (ref 4.20–5.40)
RDW: 12 % (ref 12–15)
RDW: 12 % (ref 12–15)
WBC: 19.21 10*3/uL — ABNORMAL HIGH (ref 3.50–10.80)
WBC: 18.72 10*3/uL — ABNORMAL HIGH (ref 3.50–10.80)

## 2016-07-17 LAB — STOOL FOR WBC: Eosinophils Wright Stain: NONE SEEN

## 2016-07-17 LAB — COMPREHENSIVE METABOLIC PANEL
ALT: 42 U/L (ref 0–55)
AST (SGOT): 39 U/L — ABNORMAL HIGH (ref 5–34)
Albumin/Globulin Ratio: 1.3 (ref 0.9–2.2)
Albumin: 3 g/dL — ABNORMAL LOW (ref 3.5–5.0)
Alkaline Phosphatase: 113 U/L — ABNORMAL HIGH (ref 37–106)
Anion Gap: 8 (ref 5.0–15.0)
BUN: 14.5 mg/dL (ref 7.0–19.0)
Bilirubin, Total: 0.2 mg/dL (ref 0.2–1.2)
CO2: 25 mEq/L (ref 22–29)
Calcium: 8.3 mg/dL — ABNORMAL LOW (ref 8.5–10.5)
Chloride: 106 mEq/L (ref 100–111)
Creatinine: 0.7 mg/dL (ref 0.6–1.0)
Globulin: 2.3 g/dL (ref 2.0–3.6)
Glucose: 116 mg/dL — ABNORMAL HIGH (ref 70–100)
Potassium: 4 mEq/L (ref 3.5–5.1)
Protein, Total: 5.3 g/dL — ABNORMAL LOW (ref 6.0–8.3)
Sodium: 139 mEq/L (ref 136–145)

## 2016-07-17 LAB — HEMOGLOBIN AND HEMATOCRIT, BLOOD
Hematocrit: 38.2 % (ref 37.0–47.0)
Hematocrit: 36.7 % — ABNORMAL LOW (ref 37.0–47.0)
Hematocrit: 37.8 % (ref 37.0–47.0)
Hgb: 13.1 g/dL (ref 12.0–16.0)
Hgb: 12.3 g/dL (ref 12.0–16.0)
Hgb: 12.9 g/dL (ref 12.0–16.0)

## 2016-07-17 LAB — HIV AG/AB 4TH GENERATION: HIV Ag/Ab, 4th Generation: NONREACTIVE

## 2016-07-17 LAB — CLOSTRIDIUM DIFFICILE TOXIN B PCR: Stool Clostridium difficile Toxin B PCR: NEGATIVE

## 2016-07-17 LAB — GFR: EGFR: >60

## 2016-07-17 LAB — C-REACTIVE PROTEIN HIGH SENSITIVE: C-Reactive Protein, High Sensitive: 13.85 mg/dL — ABNORMAL HIGH (ref 0.00–0.50)

## 2016-07-17 MED ORDER — PEG 3350-KCL-NABCB-NACL-NASULF 236 G PO SOLR
4000.0000 mL | Freq: Once | ORAL | Status: DC
Start: 2016-07-17 — End: 2016-07-17
  Filled 2016-07-17: qty 4000

## 2016-07-17 MED ORDER — HYDROMORPHONE HCL 1 MG/ML IJ SOLN
1.0000 mg | INTRAMUSCULAR | Status: DC | PRN
Start: 2016-07-17 — End: 2016-07-18
  Administered 2016-07-17 – 2016-07-18 (×7): 1 mg via INTRAVENOUS
  Filled 2016-07-17 (×7): qty 1

## 2016-07-17 MED ORDER — GABAPENTIN 300 MG PO CAPS
600.0000 mg | ORAL_CAPSULE | Freq: Three times a day (TID) | ORAL | Status: DC
Start: 2016-07-17 — End: 2016-07-18
  Administered 2016-07-17 – 2016-07-18 (×3): 600 mg via ORAL
  Filled 2016-07-17 (×3): qty 2

## 2016-07-17 MED ORDER — LEVOFLOXACIN IN D5W 500 MG/100ML IV SOLN
500.0000 mg | INTRAVENOUS | Status: DC
Start: 2016-07-17 — End: 2016-07-18
  Administered 2016-07-17 – 2016-07-18 (×2): 500 mg via INTRAVENOUS
  Filled 2016-07-17 (×2): qty 100

## 2016-07-17 MED ORDER — DIPHENHYDRAMINE HCL 50 MG/ML IJ SOLN
12.5000 mg | INTRAMUSCULAR | Status: DC | PRN
Start: 2016-07-17 — End: 2016-07-18
  Administered 2016-07-17 – 2016-07-18 (×2): 12.5 mg via INTRAVENOUS
  Filled 2016-07-17 (×2): qty 1

## 2016-07-17 MED ORDER — GABAPENTIN 100 MG PO CAPS
100.0000 mg | ORAL_CAPSULE | Freq: Every evening | ORAL | Status: DC | PRN
Start: 2016-07-17 — End: 2016-07-18
  Administered 2016-07-18: 100 mg via ORAL
  Filled 2016-07-17: qty 1

## 2016-07-17 MED ORDER — METRONIDAZOLE IN NACL 500 MG/100 ML IV SOLN
500.0000 mg | Freq: Three times a day (TID) | INTRAVENOUS | Status: DC
Start: 2016-07-17 — End: 2016-07-18
  Administered 2016-07-17 – 2016-07-18 (×5): 500 mg via INTRAVENOUS
  Filled 2016-07-17 (×6): qty 100

## 2016-07-17 MED ORDER — OXCARBAZEPINE 300 MG PO TABS
300.0000 mg | ORAL_TABLET | Freq: Two times a day (BID) | ORAL | Status: DC
Start: 2016-07-17 — End: 2016-07-18
  Administered 2016-07-17 – 2016-07-18 (×3): 300 mg via ORAL
  Filled 2016-07-17 (×3): qty 1

## 2016-07-17 MED FILL — Morphine Sulfate IV Soln PF 10 MG/ML: INTRAVENOUS | Qty: 1 | Status: AC

## 2016-07-17 NOTE — Plan of Care (Signed)
Problem: Pain interferes with ability to perform ADL  Goal: Pain at adequate level as identified by patient  Outcome: Progressing    07/17/16 0052   Goal/Interventions addressed this shift   Pain at adequate level as identified by patient Identify patient comfort function goal;Assess for risk of opioid induced respiratory depression, including snoring/sleep apnea. Alert healthcare team of risk factors identified.;Assess pain on admission, during daily assessment and/or before any "as needed" intervention(s);Reassess pain within 30-60 minutes of any procedure/intervention, per Pain Assessment, Intervention, Reassessment (AIR) Cycle;Evaluate if patient comfort function goal is met;Evaluate patient's satisfaction with pain management progress;Offer non-pharmacological pain management interventions;Include patient/patient care companion in decisions related to pain management as needed   Pt medicated with Dilaudid IV per prn order for abdominal pain with adequate relief.    Problem: Safety  Goal: Patient will be free from injury during hospitalization  Outcome: Progressing    07/17/16 0052   Goal/Interventions addressed this shift   Patient will be free from injury during hospitalization  Assess patient's risk for falls and implement fall prevention plan of care per policy;Provide and maintain safe environment;Use appropriate transfer methods;Ensure appropriate safety devices are available at the bedside;Include patient/ family/ care giver in decisions related to safety;Hourly rounding;Assess for patients risk for elopement and implement Elopement Risk Plan per policy   Bed alarm activated. Call bell and all personal items within reach at all times. Bed kept in lowest position and locked. Pt calls appropriately for assist.

## 2016-07-17 NOTE — Progress Notes (Signed)
[facility] ([facility])    Received a referral to schedule a follow up appointment with the [facility].  Appointment scheduled for Tuesday 07/23/16 at 10:30AM at [facility] location.      Clinic address is as follows:      [address]. [place], [place]. [address] [place], [address]    Please notify patient to arrive 15 minutes early to the appointment and bring the following materials with them:    Insurance card (if insured) and photo ID  Medications in their original bottles  Glucometer/blood sugar log (if diabetic)  Weight log (if heart failure)  Proof of income (to enroll in medication assistance programs-first two pages of signed 1040 tax forms or last 2 months of pay stubs)      Terressa Koyanagi  Transitional Services   Sched Reg Rep II  T [phone]  F [phone]

## 2016-07-17 NOTE — Progress Notes (Signed)
Will hold on colonoscopy since pt is in too much abdominal pain at this time. Will reevaluate in am.     Osie Cheeks FNP-C   [facility]

## 2016-07-17 NOTE — Progress Notes (Signed)
[facility] HOSPITAL[facility]  Progress Note  Patient Info:   Date/Time: 07/17/2016 / 6:43 PM   Admit Date:07/16/2016  Patient Name:Michele Barajas   JWJ:[id number]   PCP: Christa See, MD  Attending Physician:Suzi Hernan, Betsey Holiday, MD     Subjective:   07/17/2016     Still have significant abdominal pain. Stool intermittently bloody. No chest pain, shortness of breath or urinary symptom. Nauseated    Chief Complaint:  Rectal Bleeding and Abdominal Pain  Update of Review of Systems:  Review of Systems   Constitutional: Negative for fever, chills and malaise/fatigue.   Respiratory: Negative for cough, sputum production and shortness of breath.    Cardiovascular: Negative for chest pain and leg swelling.   Gastrointestinal: Positive for nausea, abdominal pain, diarrhea and blood in stool. Negative for vomiting and constipation.   Genitourinary: Negative for dysuria, urgency and frequency.   Musculoskeletal: Positive for joint pain. Negative for back pain.        Previous history of occasional joint pain   Skin: Negative for rash.   Neurological: Negative for dizziness, sensory change, focal weakness and headaches.   Psychiatric/Behavioral: Negative for depression. The patient is not nervous/anxious.      Assessment and Plan:   This is a 50 year old with past medical history of PTSD, fibromyalgia, comes in with half a day of severe abdominal cramps, nausea, vomiting and hemorrhagic diarrhea with CT evidence of diffuse colitis      Hemorrhagic Colitis with nausea, vomiting abdominal pain: Likely infectious etiology; Stool cultures for Shigella, Salmonella and Campylobacter, C. difficile negative. Continue clears. Start Levaquin and Flagyl. Hold off colonoscopy per GI. Probiotics.    Inadequate pain control: Diffuse cramping pain from above:Increase Dilaudid 1 mg q 3 hours   Leukocytosis due to colitis. Monitor.    Abnormal liver function test. Monitor. Improving. Unremarkable biliary tract on CT scan.     Hypoalbuminemia. Monitor    Disposition. Optimize pain control. Continue antibiotics. Discussed with patient and Shana GI NP and Dr. Janalyn Rouse, Infectious disease specialist      DVT Prohylaxis:  SCD. Would not give lovenox due to GI bleed  Central Line/Foley Catheter/PICC line status: none  Code Status: Full Code  Disposition: Home  Type of Admission:Inpatient  Anticipated Length of Stay: > 2 midnights  Medical Necessity for stay: Acute colitis  Hospital Problems:   Principal Problem:    Acute hemorrhagic colitis  Active Problems:    Colitis    Leukocytosis    Objective:     Filed Vitals:    07/17/16 0622 07/17/16 0927 07/17/16 1310 07/17/16 1731   BP: 116/65 123/78 130/79 120/73   Pulse: 85 85 77 79   Temp: 98.2 F (36.8 C) 98.1 F (36.7 C) 98.1 F (36.7 C) 97.5 F (36.4 C)   TempSrc: Oral   Temporal Artery   Resp: 28 18 16 16    Height:       Weight: 96.48 kg (212 lb 11.2 oz)      SpO2: 93% 94% 98% 99%     Physical Exam:   Physical Exam   Constitutional: She is oriented to person, place, and time.   HENT:   Mouth/Throat: No oropharyngeal exudate.   Eyes: No scleral icterus.   Cardiovascular: Normal rate and regular rhythm.    No murmur heard.  Pulmonary/Chest: Effort normal and breath sounds normal. No respiratory distress. She has no wheezes. She has no rales.   Abdominal: Soft. Bowel sounds are normal. She exhibits no  distension. There is tenderness. There is guarding. There is no rebound.   Musculoskeletal: Normal range of motion. She exhibits no edema.   Neurological: She is alert and oriented to person, place, and time. No cranial nerve deficit. Coordination normal. GCS score is 15.   Skin: Skin is warm. No erythema.   Psychiatric:   Slightly anxious, history of memory issue but patient appear following well with current issues     Results of Labs/imaging   Labs and radiology reports have been reviewed.    Hospitalist   Signed by:   [address] Elmon Else Karmen Altamirano  07/17/2016 6:43 PM    *This note was generated by  the Epic EMR system/ Dragon speech recognition and may contain inherent errors or omissions not intended by the user. Grammatical errors, random word insertions, deletions, pronoun errors and incomplete sentences are occasional consequences of this technology due to software limitations. Not all errors are caught or corrected. If there are questions or concerns about the content of this note or information contained within the body of this dictation they should be addressed directly with the author for clarification

## 2016-07-17 NOTE — UM Notes (Signed)
Hello    REF# [id number]  Admission clinicals for ED/OBS admit 07/16/16 to changed to INPATIENT 07/17/16  C/b to Wynona Canes [phone]    Thank-you!      50 yo female to ED 07/16/16 1228 here with abdominal pain, nausea, vomiting and diarrhea. Last night patient had generalized abdominal pain, nausea, loose watery diarrhea. This morning. The diarrhea has gone to bright red blood per rectum and abdominal pain has worsened. Patient was seen at urgent care, had lab work, IV fluid and given Demerol for pain and sent here for further evaluation.  Pt arrives tearful and in fetal position.  Height 5'4" / Weight : 212 lbs  98.2, HR 93, RR 20, BP 138/62, sats 95%  PMH : concussion, fibromyalgia, aortic aneurysm repair  WBC 19.31, Gucose 124, BUN 20.7, AST 48, Alk Phos 141  UA + cloudy  CT ABD/Pelvis : 1. Severe diffuse colonic wall thickening consistent with colitis. No evidence of diverticulitis. 2. Hysterectomy.  On exam : on rectal exam : Bright red blood  In ED : IVF bolus , 4mg  IV Zofran, 4mg  IV Morphine xs 4, 10mg  IV Bentyl, 400mg  po Acyclovir  From ED to TELE  Admit : Hemorrhagic colitis with nausea, vomiting and melana    MD assessment/plan :  50 year old with past medical history of PTSD, fibromyalgia, comes in with half a day of severe abdominal cramps, nausea, vomiting and hemorrhagic diarrhea with CT evidence of diffuse colitis  -ID: Hemorrhagic Colitis with nausea, vomiting: Likely infectious etiology; continue supportive care with IV fluids, antiemetics. Hold off on IV antibiotics for now. Discussed case with Dr Janalyn Rouse who informed me that there are a lot of cases of Shigella going around and if abx are started pt could be at risk of HUS. If pt gets worse, will consult Dr Janalyn Rouse. Check stool cultures for Shigella, Salmonella and Campylobacter. Check C. difficile as well. Placed on clear liquids for now  -Inadequate pain control: Diffuse cramping pain from above: Start Dilaudid 0.5  every 4 when necessary  -Leukocytosis due to above. We will recheck tomorrow  -DVT Prohylaxis:SEDs     OBS admit order 07/16/16 1551, tele, po Acyclovir, Wellbutrin, Klonopin, Bentyl, Pepcid, Risaquad, Lamictal, 500mg  IV Levaquin q24, 500mg  IV Flagyl q8, po Zoloft, .9% NaCl infusion 168ml/h cont, prn Tylenol, prn IV Dilauded, prn IV Morphine, prn IV Narcan, prn IV Zofran, GI consult, stool studies          CHANGE TO INPATIENT 07/17/16 : Severe diffuse colitis on CT scan with insufficient response to OBS care as pt today with persistent leukocytosis, bloody diarrhea, and abd pain,       On tele 07/17/16 :  Still having significant ABD pain. Stool intermittantly bloody. + Nausea    98.4, HR 93, RR to 28, BP 130/79, sats 92%  CRP 13.85, WBC 18.72, RBC 3.99, Glucose 116, Ca 8.3, AST 39, Alk Phos 113    C/o severe ABD pain > 10/10 - Increase prn IV Dilauded from .5mg  q4 to 1mg  IV q3 hours per MD orders :    .5mg  IV Dilauded given xs 2 today  1mg  IV Dilauded given xs 4 today  4mg  IV Zofran given xs 3 today      GI consult 07/17/16 :  Assessment:   Abdominal pain, nausea, vomiting, diarrhea, leukocytosis   Ct scan showing severe diffuse colonic wall thickening consistent with colitis. Rule out infectious, ischemic vs inflammatory   Stool studies negative so far     Plan:  Start flagyl/levaquin    Pt can have clears   Abdominal xray to look at size of colon, CRP ordered    Bowel prep ordered for colonoscopy   Informed consent obtained. The risks, benefits, possible complications, and alternatives to the procedure were discussed with the patient and patient understands and wishes to proceed.    Cardiac clearance due to history of aortic aneurysm repair    Plan discussed with pt, nurse and Dr. Faythe Casa               GI addendum 07/17/16 : Will hold on colonoscopy since pt is in too much abdominal pain at this time. Will reevaluate in am.       07/17/16 ABD/chest XRY : 1. Nonspecific nonobstructive bowel gas  pattern. 2. Mild bibasilar airspace disease, likely atelectasis.      ID consult 07/17/16 :  50 y.o. female, with:     Acute hemorrhagic colitis; most likely infectious   Leukocytosis    PTSD   Fibromyalgia    Recommendations:     I would like to suggest following approach:     Levaquin 500 milligram IV q. Daily   Continue Flagyl   Correction of electrolytes   Blood cultures if spikes more than 100.5    Discussed with Dr. Faythe Casa   Will follow pending stool studies   We'll adjust the antimicrobials according to the cultures and clinical course

## 2016-07-17 NOTE — Consults (Signed)
GASTROENTEROLOGY CONSULTATION  [facility]  [facility]  [phone]        Date Time: 07/17/2016 8:35 AM  Patient Name: Michele Barajas  Requesting Physician: Sunny Schlein, MD      Reason for Consultation:   Abdominal pain, colitis     Assessment:   Abdominal pain, nausea, vomiting, diarrhea, leukocytosis   Ct scan showing severe diffuse colonic wall thickening consistent with colitis.  Rule out infectious, ischemic vs inflammatory   Stool studies negative so far     Plan:   1. Start flagyl/levaquin   2. Pt can have clears  3. Abdominal xray to look at size of colon, CRP ordered   4. Bowel prep ordered   Informed consent obtained. The risks, benefits, possible complications, and alternatives to the procedure were discussed with the patient and patient understands and wishes to proceed.   Cardiac clearance due to history of aortic aneurysm repair   Plan discussed with pt, nurse and Dr. Faythe Casa     History:   Michele Barajas is a 50 y.o. female with PMH: hysterectomy, abdominal aortic aneurysm repair in 2015, PTSD, fibromyalgia, who presents to the hospital on 07/16/2016 with severe abdominal cramps, nausea, vomiting and diarrhea.  Pt reports she woke up yesterday morning and was having abdominal cramps.  Felt like she had to use the bathroom.  Had some hard stools pass than it went to liquid watery stools with large amount of blood.  Husband took pt to urgent care and then was transferred to [facility] per patient.   CT scan showed diffuse colitis. Wbc were elevated.  GI was consulted due to the above findings.  Pt has had a colonoscopy about 7 years ago due to her mom had colon cancer.  Pt denies any history of IBD.  Had not travelled or ate anything different than normal.      Endoscopic History   EGD:    Colonoscopy: 7 years ago, normal per patient   ERCP:    Past Medical History:     Past Medical History   Diagnosis Date   . Concussion    . Fibromyalgia             Past Surgical  History:     Past Surgical History   Procedure Laterality Date   . Hysterectomy     . Knee surgery     . Cervical fusion     . Ascending aortic aneurysm repair         Family History:   History reviewed. No pertinent family history.    Social History:     Social History     Social History   . Marital Status: Married     Spouse Name: N/A   . Number of Children: N/A   . Years of Education: N/A     Social History Main Topics   . Smoking status: Never Smoker    . Smokeless tobacco: Never Used   . Alcohol Use: Yes      Comment: socialluy   . Drug Use: No   . Sexual Activity: Not on file     Other Topics Concern   . Not on file     Social History Narrative   . No narrative on file       Allergies:     Allergies   Allergen Reactions   . Fentanyl        Medications:     Current Facility-Administered Medications  Medication Dose Route Frequency   . acyclovir  400 mg Oral BID   . buPROPion XL  300 mg Oral Daily   . clonazePAM  1 mg Oral TID   . dicyclomine  10 mg Oral QID   . famotidine  20 mg Oral Q12H SCH    Or   . famotidine  20 mg Intravenous Q12H SCH   . lactobacillus/streptococcus  1 capsule Oral Daily   . lamoTRIgine  200 mg Oral BID   . sertraline  75 mg Oral Daily       Review of Systems:   A comprehensive review of systems was:   History obtained from the patient  Gastrointestinal ROS:  positive for - abdominal pain and diarrhea and blood in stool   negative for - appetite loss, constipation, hematemesis or swallowing difficulty/pain    A comprehensive review of systems was: History obtained from the patient  The patient denies any additional changes to their otic, opthalmologic, dermatologic, pulmonary, cardiac, genitourinary, musculoskeletal, hematologic, constitutional, or psychiatric systems.      Physical Exam:     Filed Vitals:    07/17/16 0622   BP: 116/65   Pulse: 85   Temp: 98.2 F (36.8 C)   Resp: 28   SpO2: 93%       Intake and Output Summary (Last 24 hours) at Date Time    Intake/Output Summary (Last  24 hours) at 07/17/16 0835  Last data filed at 07/17/16 1610   Gross per 24 hour   Intake    875 ml   Output    450 ml   Net    425 ml       General appearance - alert, well appearing, and in no distress  Mental status - alert, oriented to person, place, and time  Eyes - pupils equal and reactive, extraocular eye movements intact  Chest - clear to auscultation, no wheezes, rales or rhonchi, symmetric air entry  Heart - normal rate, regular rhythm, normal S1, S2, no murmurs, rubs, clicks or gallops  Abdomen -obese,  soft, lower abdominal tenderness, nondistended, no masses or organomegaly  Extremities - peripheral pulses normal, no pedal edema, no clubbing or cyanosis      Lab:     Results     Procedure Component Value Units Date/Time    Ova and parasite screen [[id number]] Collected:  07/17/16 0400    Specimen Information:  Stool from Stool Updated:  07/17/16 0745    Narrative:      Parasite Screen    Hemoglobin and hematocrit, blood [[id number]] Collected:  07/17/16 0706    Specimen Information:  Blood Updated:  07/17/16 0706    Granulocyte Count [[id number]]  (Abnormal) Collected:  07/17/16 0400    Specimen Information:  Stool Updated:  07/17/16 7846     Segs Delford Field Stain Present (A)      EOS Delford Field Stain None Seen      RBC Delford Field Stain Present (A)      Lymph Delford Field Stain Present (A)     Stool for Salm/Shig/Campy/Shiga PCR [[id number]] Collected:  07/16/16 1525    Specimen Information:  Stool Updated:  07/17/16 0203    Narrative:      ORDER#: [id number]                                    ORDERED BY: Angelique Blonder  SOURCE: Stool  COLLECTED:  07/16/16 15:25  ANTIBIOTICS AT COLL.:                                RECEIVED :  07/16/16 19:20  Salmonella species by PCR                  FINAL       07/17/16 02:03  07/17/16   Negative  Shigella species/EIEC by PCR               FINAL       07/17/16 02:03  07/17/16   Negative  Campylobacter jejuni/coli by PCR           FINAL       07/17/16  02:03  07/17/16   Negative  Shiga Toxin by PCR                         FINAL       07/17/16 02:02  07/17/16   Negative                 Testing performed using a BDMax multiplex PCR panel which             detects bacterial DNA. A positive result does not necessarily             indicate the presence of viable organisms. Positive results             do not rule out co-infection with other organisms that are             not detected by this test. This panel does not differentiate             between Shigella spp. and Enteroinvasive Escherichia coli             (EIEC), which are closely related and may cause similar             illness.             Culture for serotyping of Salmonella, Shigella, or Shiga             Toxin DNA positive samples will be attempted if PCR positive,             see separate report from [facility] for final results.      Comprehensive metabolic panel [[id number]]  (Abnormal) Collected:  07/17/16 0048    Specimen Information:  Blood Updated:  07/17/16 0130     Glucose 116 (H) mg/dL      BUN 95.6 mg/dL      Creatinine 0.7 mg/dL      Calcium 8.3 (L) mg/dL      Sodium 213 mEq/L      Potassium 4.0 mEq/L      Chloride 106 mEq/L      CO2 25 mEq/L      Anion Gap 8.0      Protein, Total 5.3 (L) g/dL      Albumin 3.0 (L) g/dL      AST (SGOT) 39 (H) U/L      ALT 42 U/L      Alkaline Phosphatase 113 (H) U/L      Bilirubin, Total 0.2 mg/dL      Globulin 2.3 g/dL      Albumin/Globulin Ratio 1.3     GFR [[id number]] Collected:  07/17/16 0048     EGFR >60.0 Updated:  07/17/16 0130    CBC with differential [[id number]]  (Abnormal) Collected:  07/17/16 0048    Specimen Information:  Blood from Blood Updated:  07/17/16 0111     Hgb 13.2 g/dL      Hematocrit 40.9 %      WBC 19.21 (H) x10 3/uL      Platelets 257 x10 3/uL      RBC 4.14 (L) x10 6/uL      MCV 93.5 fL      MCH 31.9 pg      MCHC 34.1 g/dL      RDW 12 %      MPV 10.3 fL      Nucleated RBC 0.0 /100 WBC      Absolute NRBC 0.00 x10 3/uL      Neutrophils 86.0 %       Lymphocytes Automated 4.5 %      Monocytes 8.7 %      Eosinophils Automated 0.1 %      Basophils Automated 0.2 %      Immature Granulocyte 0.5 %      Neutrophils Absolute 16.53 (H) x10 3/uL      Abs Lymph Automated 0.86 x10 3/uL      Abs Mono Automated 1.67 (H) x10 3/uL      Abs Eos Automated 0.01 x10 3/uL      Absolute Baso Automated 0.04 x10 3/uL      Absolute Immature Granulocyte 0.10 (H) x10 3/uL     Stool for C. diff [[id number]] Collected:  07/16/16 1525    Specimen Information:  Stool from Stool Updated:  07/17/16 0103     Clostridium Difficile Toxin B PCR --      Result:        Negative  Test performed using a BDMax PCR Assay. Due to the high  sensitivity of the assay, repeat testing is not recommended  and will be cancelled following [employer] policy. Molecular  detection of C. difficile is not recommended as a test of cure  or for surveillance purposes. A positive test may indicate  infection or colonization with C. difficile. Testing will not  be performed on formed stool.      Narrative:      Patient's current admission began:->less than or equal to 3  days ago  Cancel C-Diff order if no diarrhea in 12 hours?->Yes  DELAY AFFECTS RESULT    Hemoglobin and hematocrit, blood [[id number]] Collected:  07/16/16 1917    Specimen Information:  Blood Updated:  07/16/16 1935     Hgb 13.7 g/dL      Hematocrit 57.8 %     UA with reflex to micro (pts  3 + yrs) [[id number]]  (Abnormal) Collected:  07/16/16 1417    Specimen Information:  Urine Updated:  07/16/16 1428     Urine Type Clean Catch      Color, UA Yellow      Clarity, UA Cloudy (A)      Specific Gravity UA 1.016      Urine pH 7.0      Leukocyte Esterase, UA Negative      Nitrite, UA Negative      Protein, UR Negative      Glucose, UA Negative      Ketones UA Negative      Urobilinogen, UA Normal mg/dL      Bilirubin, UA Negative      Blood, UA Negative     Type and Screen [[id number]] Collected:  07/16/16 1258  Specimen Information:  Blood Updated:  07/16/16 1418      ABO Rh O POS      AB Screen Gel NEG     PT/APTT [[id number]] Collected:  07/16/16 1258     PT 12.8 sec Updated:  07/16/16 1327     PT INR 1.0      PT Anticoag. Given Within 48 hrs. None      PTT 28 sec     Comprehensive metabolic panel [[id number]]  (Abnormal) Collected:  07/16/16 1258    Specimen Information:  Blood Updated:  07/16/16 1322     Glucose 124 (H) mg/dL      BUN 17.6 (H) mg/dL      Creatinine 0.8 mg/dL      Sodium 160 mEq/L      Potassium 4.6 mEq/L      Chloride 103 mEq/L      CO2 26 mEq/L      Calcium 8.9 mg/dL      Protein, Total 6.1 g/dL      Albumin 3.5 g/dL      AST (SGOT) 48 (H) U/L      ALT 47 U/L      Alkaline Phosphatase 141 (H) U/L      Bilirubin, Total 0.2 mg/dL      Globulin 2.6 g/dL      Albumin/Globulin Ratio 1.3      Anion Gap 10.0     Lipase [[id number]]  (Abnormal) Collected:  07/16/16 1258    Specimen Information:  Blood Updated:  07/16/16 1322     Lipase <4 (L) U/L     GFR [[id number]] Collected:  07/16/16 1258     EGFR >60.0 Updated:  07/16/16 1322    CBC with differential [[id number]]  (Abnormal) Collected:  07/16/16 1258    Specimen Information:  Blood from Blood Updated:  07/16/16 1307     WBC 19.31 (H) x10 3/uL      Hgb 14.2 g/dL      Hematocrit 99.3 %      Platelets 251 x10 3/uL      RBC 4.44 x10 6/uL      MCV 92.6 fL      MCH 32.0 pg      MCHC 34.5 g/dL      RDW 12 %      MPV 10.0 fL      Neutrophils 87.1 %      Lymphocytes Automated 3.9 %      Monocytes 8.1 %      Eosinophils Automated 0.1 %      Basophils Automated 0.2 %      Immature Granulocyte 0.6 %      Nucleated RBC 0.0 /100 WBC      Neutrophils Absolute 16.83 (H) x10 3/uL      Abs Lymph Automated 0.76 x10 3/uL      Abs Mono Automated 1.56 (H) x10 3/uL      Abs Eos Automated 0.01 x10 3/uL      Absolute Baso Automated 0.04 x10 3/uL      Absolute Immature Granulocyte 0.11 (H) x10 3/uL      Absolute NRBC 0.00 x10 3/uL             Radiology:   Radiological Procedure reviewed.     Signed by: Tenna Delaine

## 2016-07-17 NOTE — Consults (Signed)
INFECTIOUS DISEASE CONSULT  Alfonzo Beers, MD, FACP          Date Time: 07/17/2016 6:09 PM  Patient Name: Michele Barajas  Referring Physician: Sunny Schlein, MD      Reason for consult:     Colitis, bloody diarrhea    History of present illness:     Michele Barajas CSN:[id number],MRN:[id number] is a 50 y.o. female, with past medical history significant for fibromyalgia, PTSD, who was admitted through the emergency room with severe nausea, vomiting, bloody diarrhea lung with severe abdominal pain. She was found to have significant leukocytosis and her imaging was consistent with colitis. Complains of severe abdominal cramps. She recently ate outside, but denies any sick contacts, denies any similar episodes in the past. Her last coloscopy was about 7 years ago and [facility]. Her antibiotics were held after discussion with the me until the Shiga toxin results were available. Shiga toxin is negative.    Review of systems:     Constitutional:   Complains of chills, malaise/fatigue.  HEENT: Denies any blurred vision, double vision, photophobia, pain, discharge and redness.   Respiratory:  Denies any cough, hemoptysis, sputum production, shortness of breath, wheezing and stridor.   Cardiovascular: Denies any chest pain, palpitations, orthopnea, claudication, leg swelling.   Gastrointestinal:  Complains of nausea, vomiting, abdominal pain, diarrhea, blood in stool and melena.   Genitourinary: Denies any dysuria, urgency, frequency, hematuria and flank pain.   Musculoskeletal: Denies any myalgias, back pain, joint pain and falls.   Skin: Denies any itching and rash.   Neurological:  Complains of dizziness, weakness and headaches.   Endo/Heme/Allergies: Denies any environmental allergies and polydipsia. Does not bruise/bleed easily.   Psychiatric/Behavioral: History of concussion, PTSD, fibromyalgia  Other review of systems are noncontributory.    Allergies:     Allergies   Allergen Reactions   .  Fentanyl        Past medical history:     Past Medical History   Diagnosis Date   . Concussion    . Fibromyalgia        Past surgical history:     Past Surgical History   Procedure Laterality Date   . Hysterectomy     . Knee surgery     . Cervical fusion     . Ascending aortic aneurysm repair         Family history:     History reviewed. No pertinent family history.    Social history:     History   Alcohol Use   . Yes     Comment: socialluy     History   Drug Use No     History   Smoking status   . Never Smoker    Smokeless tobacco   . Never Used       Medications:     Current Facility-Administered Medications   Medication Dose Route Frequency   . acyclovir  400 mg Oral BID   . buPROPion XL  300 mg Oral Daily   . clonazePAM  1 mg Oral TID   . dicyclomine  10 mg Oral QID   . famotidine  20 mg Oral Q12H SCH    Or   . famotidine  20 mg Intravenous Q12H SCH   . lactobacillus/streptococcus  1 capsule Oral Daily   . lamoTRIgine  200 mg Oral BID   . levoFLOXacin  500 mg Intravenous Q24H SCH   . metroNIDAZOLE  500 mg Intravenous Q8H SCH   .  sertraline  75 mg Oral Daily       Physical Exam:     Blood pressure 120/73, pulse 79, temperature 97.5 F (36.4 C), temperature source Oral, resp. rate 16, height 1.626 m (5\' 4" ), weight 96.48 kg (212 lb 11.2 oz), SpO2 99 %.    General Appearance: Comfortable, well-appearing and in no acute distress at present.   HEENT: Pallor negative, Anicteric sclera, dry mucous membranes.   Neck: Supple  Lungs: Normal respiratory rate and normal effort. Not in respiratory distress. Breath sounds clear to auscultation.   Chest Wall: Symmetric chest wall expansion.   Heart : S1 and S2. no murmurs, rub, gallop  Abdomen: Abdomen is soft, mild diffuse discomfort.  Neurological: Alert and oriented to person, place and time. Normal strength. No gross defect.   Extremities: Normal range of motion.  Skin: Warm and dry. No rash or ecchymosis   Psychiatric: Mood and affect is normal at  present    Labs:     Recent Labs      07/17/16   1255  07/17/16   0926   07/17/16   0048   WBC   --   18.72*   --   19.21*   HGB  12.3  12.8   < >  13.2   HEMATOCRIT  36.7*  37.3   < >  38.7   PLATELETS   --   231   --   257   MCV   --   93.5   --   93.5    < > = values in this interval not displayed.       Recent Labs      07/17/16   0048  07/16/16   1258   SODIUM  139  139   POTASSIUM  4.0  4.6   CHLORIDE  106  103   CO2  25  26   BUN  14.5  20.7*   CREATININE  0.7  0.8   GLUCOSE  116*  124*   CALCIUM  8.3*  8.9       Recent Labs      07/17/16   0048  07/16/16   1258   AST (SGOT)  39*  48*   ALT  42  47   ALKALINE PHOSPHATASE  113*  141*   PROTEIN, TOTAL  5.3*  6.1   ALBUMIN  3.0*  3.5   BILIRUBIN, TOTAL  0.2  0.2       Imaging studies:      CT abdomen:  1. Severe diffuse colonic wall thickening consistent with colitis. No  evidence of diverticulitis.  2. Hysterectomy     Assessment :     Michele Barajas is a 50 y.o. female, with:     Acute hemorrhagic colitis; most likely infectious   Leukocytosis    PTSD   Fibromyalgia    Recommendations:     I would like to suggest following approach:     Levaquin 500 milligram IV q. Daily   Continue Flagyl   Correction of electrolytes   Blood cultures if spikes more than 100.5    Discussed with Dr. Faythe Casa   Will follow pending stool studies   We'll adjust the antimicrobials according to the cultures and clinical course     I will follow this patient closely with you    Thank you [facility] for involving me in care of Michele Barajas          Signed by:  Alfonzo Beers, MD, FACP  Date Time: 07/17/2016 6:09 PM      *This note was generated by the Epic EMR system/ Dragon speech recognition and may contain inherent errors or omissions not intended by the user. Grammatical errors, random word insertions, deletions, pronoun errors and incomplete sentences are occasional consequences of this technology due to software limitations. Not all errors are caught or corrected. If there  are questions or concerns about the content of this note or information contained within the body of this dictation they should be addressed directly with the author for clarification

## 2016-07-17 NOTE — Progress Notes (Signed)
07/17/16 1412   Patient Type   Within 30 Days of Previous Admission? No   Healthcare Decisions   Interviewed: Patient   Orientation/Decision Making Abilities of Patient Alert and Oriented x3, able to make decisions   Advance Directive Patient does not have advance directive   Healthcare Agent Appointed No   Additional Emergency Contacts? Katya Rolston, husband, [phone] cell    Prior to admission   Prior level of function Independent with ADLs;Ambulates independently   Type of Residence Private residence   Home Layout Two level   Have running water, electricity, heat, etc? Yes   Living Arrangements Spouse/significant other;Family members   How do you get to your MD appointments? herself   (patient is able to drive)   How do you get your groceries? herself or husband    Who fixes your meals? herself or husband    Who does your laundry? herself    Who picks up your prescriptions? herself or husband    Dressing Independent   Grooming Independent   Feeding Independent   Bathing Independent   Toileting Independent   DME Currently at Home Other (Comment)  (none)   Home Care/Community Services None   Prior SNF admission? (Detail) none   Prior Rehab admission? (Detail) none   Adult Protective Services (APS) involved? No   Discharge Planning   Support Systems Spouse/significant other;Family members   Patient expects to be discharged to: home    Anticipated [place] plan discussed with: Same as interviewed;Patient   Follow up appointment scheduled? No   Reason no follow up scheduled? Referred to [facility]   Potential barriers to discharge: Testing/procedure   Mode of transportation: Private car (family member)   Consults/Providers   PT Evaluation Needed 2  (no)   OT Evalulation Needed 2  (no)   SLP Evaluation Needed 2  (no)   Outcome Palliative Care Screen Screened but did not meet criteria for intervention   Correct PCP listed in Epic? No (comment)  (a list of [facility] physicians given to the patients as she requested.   she prefers to find her own PCP in the [place] insurance)   Important Message from [employer] Notice   Patient received 1st IMM Letter? n/a

## 2016-07-18 ENCOUNTER — Ambulatory Visit: Payer: Commercial Managed Care - POS

## 2016-07-18 LAB — COMPREHENSIVE METABOLIC PANEL
ALT: 33 U/L (ref 0–55)
AST (SGOT): 42 U/L — ABNORMAL HIGH (ref 5–34)
Albumin/Globulin Ratio: 1 (ref 0.9–2.2)
Albumin: 2.4 g/dL — ABNORMAL LOW (ref 3.5–5.0)
Alkaline Phosphatase: 88 U/L (ref 37–106)
Anion Gap: 4 — ABNORMAL LOW (ref 5.0–15.0)
BUN: 9.5 mg/dL (ref 7.0–19.0)
Bilirubin, Total: 0.3 mg/dL (ref 0.2–1.2)
CO2: 24 mEq/L (ref 22–29)
Calcium: 7.7 mg/dL — ABNORMAL LOW (ref 8.5–10.5)
Chloride: 111 mEq/L (ref 100–111)
Creatinine: 0.7 mg/dL (ref 0.6–1.0)
Globulin: 2.4 g/dL (ref 2.0–3.6)
Glucose: 74 mg/dL (ref 70–100)
Potassium: 3.8 mEq/L (ref 3.5–5.1)
Protein, Total: 4.8 g/dL — ABNORMAL LOW (ref 6.0–8.3)
Sodium: 139 mEq/L (ref 136–145)

## 2016-07-18 LAB — CBC AND DIFFERENTIAL
Absolute NRBC: 0 10*3/uL
Basophils Absolute Automated: 0.04 10*3/uL (ref 0.00–0.20)
Basophils Automated: 0.3 %
Eosinophils Absolute Automated: 0.13 10*3/uL (ref 0.00–0.70)
Eosinophils Automated: 1 %
Hematocrit: 35.4 % — ABNORMAL LOW (ref 37.0–47.0)
Hgb: 11.7 g/dL — ABNORMAL LOW (ref 12.0–16.0)
Immature Granulocytes Absolute: 0.08 10*3/uL — ABNORMAL HIGH
Immature Granulocytes: 0.6 %
Lymphocytes Absolute Automated: 1.9 10*3/uL (ref 0.50–4.40)
Lymphocytes Automated: 14.7 %
MCH: 31.9 pg (ref 28.0–32.0)
MCHC: 33.1 g/dL (ref 32.0–36.0)
MCV: 96.5 fL (ref 80.0–100.0)
MPV: 11 fL (ref 9.4–12.3)
Monocytes Absolute Automated: 1.47 10*3/uL — ABNORMAL HIGH (ref 0.00–1.20)
Monocytes: 11.4 %
Neutrophils Absolute: 9.33 10*3/uL — ABNORMAL HIGH (ref 1.80–8.10)
Neutrophils: 72 %
Nucleated RBC: 0 /100 WBC (ref 0.0–1.0)
Platelets: 195 10*3/uL (ref 140–400)
RBC: 3.67 10*6/uL — ABNORMAL LOW (ref 4.20–5.40)
RDW: 12 % (ref 12–15)
WBC: 12.95 10*3/uL — ABNORMAL HIGH (ref 3.50–10.80)

## 2016-07-18 LAB — GFR: EGFR: >60

## 2016-07-18 MED ORDER — RISAQUAD PO CAPS
1.0000 | ORAL_CAPSULE | Freq: Every day | ORAL | Status: AC
Start: 2016-07-18 — End: 2016-08-17

## 2016-07-18 MED ORDER — LEVOFLOXACIN 500 MG PO TABS
500.0000 mg | ORAL_TABLET | Freq: Every day | ORAL | Status: AC
Start: 2016-07-18 — End: 2016-07-26

## 2016-07-18 MED ORDER — HYDROMORPHONE HCL 2 MG PO TABS
2.0000 mg | ORAL_TABLET | ORAL | Status: DC | PRN
Start: 2016-07-18 — End: 2016-07-18
  Administered 2016-07-18 (×2): 2 mg via ORAL
  Filled 2016-07-18 (×2): qty 1

## 2016-07-18 MED ORDER — METRONIDAZOLE 500 MG PO TABS
500.0000 mg | ORAL_TABLET | Freq: Three times a day (TID) | ORAL | Status: AC
Start: 2016-07-18 — End: 2016-07-26

## 2016-07-18 MED ORDER — ACETAMINOPHEN 325 MG PO TABS
650.0000 mg | ORAL_TABLET | ORAL | Status: AC | PRN
Start: 2016-07-18 — End: 2016-08-02

## 2016-07-18 MED ORDER — FLUCONAZOLE 100 MG PO TABS
100.0000 mg | ORAL_TABLET | Freq: Once | ORAL | Status: AC
Start: 2016-07-18 — End: 2016-07-18

## 2016-07-18 MED ORDER — HYDROMORPHONE HCL 2 MG PO TABS
2.0000 mg | ORAL_TABLET | ORAL | Status: AC | PRN
Start: 2016-07-18 — End: 2016-08-07

## 2016-07-18 MED ORDER — PROMETHAZINE HCL 25 MG PO TABS
25.0000 mg | ORAL_TABLET | Freq: Three times a day (TID) | ORAL | Status: AC | PRN
Start: 2016-07-18 — End: 2016-07-28

## 2016-07-18 NOTE — Discharge Instructions (Signed)
Metronidazole Oral tablet  What is this medicine?  METRONIDAZOLE (me troe NI da zole) is an antiinfective. It is used to treat certain kinds of bacterial and protozoal infections. It will not work for colds, flu, or other viral infections.  This medicine may be used for other purposes; ask your health care provider or pharmacist if you have questions.  What should I tell my health care provider before I take this medicine?  They need to know if you have any of these conditions:   anemia or other blood disorders   disease of the nervous system   fungal or yeast infection   if you drink alcohol containing drinks   liver disease   seizures   an unusual or allergic reaction to metronidazole, or other medicines, foods, dyes, or preservatives   pregnant or trying to get pregnant   breast-feeding  How should I use this medicine?  Take this medicine by mouth with a full glass of water. Follow the directions on the prescription label. Take your medicine at regular intervals. Do not take your medicine more often than directed. Take all of your medicine as directed even if you think you are better. Do not skip doses or stop your medicine early.  Talk to your pediatrician regarding the use of this medicine in children. Special care may be needed.  Overdosage: If you think you have taken too much of this medicine contact a poison control center or emergency room at once.  NOTE: This medicine is only for you. Do not share this medicine with others.  What if I miss a dose?  If you miss a dose, take it as soon as you can. If it is almost time for your next dose, take only that dose. Do not take double or extra doses.  What may interact with this medicine?  Do not take this medicine with any of the following medications:   alcohol or any product that contains alcohol   amprenavir oral solution   cisapride   disulfiram   dofetilide   dronedarone   paclitaxel injection   pimozide   ritonavir oral  solution   sertraline oral solution   sulfamethoxazole-trimethoprim injection   thioridazine   ziprasidone  This medicine may also interact with the following medications:   birth control pills   cimetidine   lithium   other medicines that prolong the QT interval (cause an abnormal heart rhythm)   phenobarbital   phenytoin   warfarin  This list may not describe all possible interactions. Give your health care provider a list of all the medicines, herbs, non-prescription drugs, or dietary supplements you use. Also tell them if you smoke, drink alcohol, or use illegal drugs. Some items may interact with your medicine.  What should I watch for while using this medicine?  Tell your doctor or health care professional if your symptoms do not improve or if they get worse.  You may get drowsy or dizzy. Do not drive, use machinery, or do anything that needs mental alertness until you know how this medicine affects you. Do not stand or sit up quickly, especially if you are an older patient. This reduces the risk of dizzy or fainting spells.  Avoid alcoholic drinks while you are taking this medicine and for three days afterward. Alcohol may make you feel dizzy, sick, or flushed.  If you are being treated for a sexually transmitted disease, avoid sexual contact until you have finished your treatment. Your sexual partner may   also need treatment.  What side effects may I notice from receiving this medicine?  Side effects that you should report to your doctor or health care professional as soon as possible:   allergic reactions like skin rash or hives, swelling of the face, lips, or tongue   confusion, clumsiness   difficulty speaking   discolored or sore mouth   dizziness   fever, infection   numbness, tingling, pain or weakness in the hands or feet   trouble passing urine or change in the amount of urine   redness, blistering, peeling or loosening of the skin, including inside the  mouth   seizures   unusually weak or tired   vaginal irritation, dryness, or discharge  Side effects that usually do not require medical attention (report to your doctor or health care professional if they continue or are bothersome):   diarrhea   headache   irritability   metallic taste   nausea   stomach pain or cramps   trouble sleeping  This list may not describe all possible side effects. Call your doctor for medical advice about side effects. You may report side effects to FDA at 1-800-FDA-1088.  Where should I keep my medicine?  Keep out of the reach of children.  Store at room temperature below 25 degrees C (77 degrees F). Protect from light. Keep container tightly closed. Throw away any unused medicine after the expiration date.  NOTE:This sheet is a summary. It may not cover all possible information. If you have questions about this medicine, talk to your doctor, pharmacist, or health care provider. Copyright 2015 Gold Standard      Levofloxacin Oral tablet  What is this medicine?  LEVOFLOXACIN (lee voe FLOX a sin) is a quinolone antibiotic. It is used to treat certain kinds of bacterial infections. It will not work for colds, flu, or other viral infections.  This medicine may be used for other purposes; ask your health care provider or pharmacist if you have questions.  What should I tell my health care provider before I take this medicine?  They need to know if you have any of these conditions:   cerebral disease   irregular heartbeat   kidney disease   seizure disorder   an unusual or allergic reaction to levofloxacin, other antibiotics or medicines, foods, dyes, or preservatives   pregnant or trying to get pregnant   breast-feeding  How should I use this medicine?  Take this medicine by mouth with a full glass of water. Follow the directions on the prescription label. This medicine can be taken with or without food. Take your medicine at regular intervals. Do not take your medicine  more often than directed. Do not skip doses or stop your medicine early even if you feel better. Do not stop taking except on your doctor's advice.  A special MedGuide will be given to you by the pharmacist with each prescription and refill. Be sure to read this information carefully each time.  Talk to your pediatrician regarding the use of this medicine in children. While this drug may be prescribed for children as young as 6 months for selected conditions, precautions do apply.  Overdosage: If you think you have taken too much of this medicine contact a poison control center or emergency room at once.  NOTE: This medicine is only for you. Do not share this medicine with others.  What if I miss a dose?  If you miss a dose, take it as soon  as you remember. If it is almost time for your next dose, take only that dose. Do not take double or extra doses.  What may interact with this medicine?  Do not take this medicine with any of the following medications:   arsenic trioxide   chloroquine   droperidol   medicines for irregular heart rhythm like amiodarone, disopyramide, dofetilide, flecainide, quinidine, procainamide, sotalol   some medicines for depression or mental problems like phenothiazines, pimozide, and ziprasidone  This medicine may also interact with the following medications:   amoxapine   antacids   cisapride   dairy products   didanosine (ddI) buffered tablets or powder   haloperidol   multivitamins   NSAIDS, medicines for pain and inflammation, like ibuprofen or naproxen   retinoid products like tretinoin or isotretinoin   risperidone   some other antibiotics like clarithromycin or erythromycin   sucralfate   theophylline   warfarin  This list may not describe all possible interactions. Give your health care provider a list of all the medicines, herbs, non-prescription drugs, or dietary supplements you use. Also tell them if you smoke, drink alcohol, or use illegal drugs. Some items  may interact with your medicine.  What should I watch for while using this medicine?  Tell your doctor or health care professional if your symptoms do not improve or if they get worse. Drink several glasses of water a day and cut down on drinks that contain caffeine. You must not get dehydrated while taking this medicine.  You may get drowsy or dizzy. Do not drive, use machinery, or do anything that needs mental alertness until you know how this medicine affects you. Do not sit or stand up quickly, especially if you are an older patient. This reduces the risk of dizzy or fainting spells.  This medicine can make you more sensitive to the sun. Keep out of the sun. If you cannot avoid being in the sun, wear protective clothing and use a sunscreen. Do not use sun lamps or tanning beds/booths. Contact your doctor if you get a sunburn.  If you are a diabetic monitor your blood glucose carefully. If you get an unusual reading stop taking this medicine and call your doctor right away.  Do not treat diarrhea with over-the-counter products. Contact your doctor if you have diarrhea that lasts more than 2 days or if the diarrhea is severe and watery.  Avoid antacids, calcium, iron, and zinc products for 2 hours before and 2 hours after taking a dose of this medicine.  What side effects may I notice from receiving this medicine?  Side effects that you should report to your doctor or health care professional as soon as possible:   allergic reactions like skin rash or hives, swelling of the face, lips, or tongue   changes in vision   confusion, nightmares or hallucinations   difficulty breathing   irregular heartbeat, chest pain   joint, muscle or tendon pain   pain or difficulty passing urine   persistent headache with or without blurred vision   redness, blistering, peeling or loosening of the skin, including inside the mouth   seizures   unusual pain, numbness, tingling, or weakness   vaginal irritation,  discharge  Side effects that usually do not require medical attention (report to your doctor or health care professional if they continue or are bothersome):   diarrhea   dry mouth   headache   stomach upset, nausea   trouble sleeping  This  list may not describe all possible side effects. Call your doctor for medical advice about side effects. You may report side effects to FDA at 1-800-FDA-1088.  Where should I keep my medicine?  Keep out of the reach of children.  Store at room temperature between 15 and 30 degrees C (59 and 86 degrees F). Keep in a tightly closed container. Throw away any unused medicine after the expiration date.  NOTE:This sheet is a summary. It may not cover all possible information. If you have questions about this medicine, talk to your doctor, pharmacist, or health care provider. Copyright 2015 Gold Standard    Discharge instruction: Activity as tolerated  Diet: Regular. Soft  Please return to emergency department if you have symptom including but not limited to fever, worsening belly pain, diarrhea, blood in stool, belly distension or any other concerning symptom

## 2016-07-18 NOTE — Discharge Summary (Addendum)
[facility] P[facility] HOSPITALIST   [place] Summary   Patient Info:   Date/Time: 07/18/2016 / 12:40 PM   Admit Date:07/16/2016  Patient Name:Michele Barajas   ZOX:[id number]   PCP: Christa See, MD  Attending Physician:Amelia Macken, Betsey Holiday, MD     Hospital Course:   Please see H&P for complete details of HPI and ROS. The patient was admitted to [facility] and has been diagnosed with Hospital Problems:  Principal Problem:    Acute hemorrhagic colitis  Active Problems:    Colitis    Leukocytosis    Leukocytosis, unspecified type   and has been taken care as mentioned below.    1. Colitis   2. Leukocytosis due to colitis. Stable.   3. Abnormal liver function test  4. Hypoalbuminemia  5. History of past traumatic stress disorder  6. History of fibromyalgia  7. History of head concussion  8. History of ascending aortic aneurysm repair  All of the above condition present on admission    This is a 50 year old with past medical history of PTSD, fibromyalgia, comes in with half a day of severe abdominal cramps, nausea, vomiting and hemorrhagic diarrhea with CT evidence of diffuse colitis     Hemorrhagic Colitis with nausea, vomiting abdominal pain: Likely infectious etiology Stool cultures for Shigella, Salmonella and Campylobacter, C. difficile negative.Continue Levaquin and Flagyl x 10 days per Gastroenterology. Side effects include but not limited to neuropathy, tendonitis/rupture, Clostridium Difficile infection, arrythmia discussed and patient agreed to continue. Probiotics.Hepatitis B surface antigen and antibody, Quantiferon gold, HIV test negative   Inadequate pain control: Diffuse cramping pain from above: Discharged with PO Dilaudid. Risk of slow reaction time, dizziness, accidents, risk of addiction discussed with patient and husband on the phone. They understands that they have to be careful and stop taking once pain improved.    Leukocytosis due to colitis. Stable.    Abnormal liver function test.  Monitor. Improving. Unremarkable biliary tract on CT scan.    Hypoalbuminemia. Follow up with primary care physician    Disposition. Vitals stable. Pain improved somewhat. Tolerate diet. She is discharged home. Finding and plan discussed with patient and husband on the phone.     Disposition: home  Condition at Discharge and Prognosis: stable, prognosis is guarded  Admission Date:07/16/2016  Discharge Date: 07/18/2016  Type of Admission:Inpatient  Medical Necessity for stay: Colitis  Code Status: Full Code  Subjective at the time of discharge:     Chief Complaint:  Rectal Bleeding and Abdominal Pain    No nausea, vomiting, moderate abdominal pain, no more bloody diarrhea, no chest pain or shortness of breath  Objective:     Filed Vitals:    07/18/16 0159 07/18/16 0546 07/18/16 0935 07/18/16 1156   BP: 89/55 87/53 101/67 114/69   Pulse: 77 72 80 82   Temp: 98.4 F (36.9 C) 97.5 F (36.4 C) 97.2 F (36.2 C) 97 F (36.1 C)   TempSrc: Oral Oral     Resp: 18 18 18 16    Height:       Weight:  94.6 kg (208 lb 8.9 oz)     SpO2: 93% 93% 97% 94%     Physical Exam:   Physical Exam   Constitutional: She is oriented to person, place, and time. No distress.   Eyes: No scleral icterus.   Cardiovascular: Normal rate and regular rhythm.    No murmur heard.  Pulmonary/Chest: Effort normal and breath sounds normal. No respiratory distress. She has no  wheezes. She has no rales.   Abdominal: Soft. Bowel sounds are normal. She exhibits no distension. There is no tenderness. There is no rebound.   Musculoskeletal: She exhibits no edema.   Neurological: She is alert and oriented to person, place, and time. No cranial nerve deficit. Coordination normal.   Skin: Skin is warm.   Psychiatric: Mood, affect and judgment normal.     Clinical Presentation:   History of Presenting Illness: Please refer to HPI in the Detailed H&P  Discharge Diagnosis and Instructions:   Hospital Problems:Principal Problem:    Acute hemorrhagic colitis  Active  Problems:    Colitis    Leukocytosis    Leukocytosis, unspecified type    Lists the present on admission hospital problems:Present on Admission:   . Colitis  Consultants: Dr. Mosie Epstein, Infectious disease specialist  Dr. Serita Grit, Gastroenterologist  Discharge Medications:   Discharge Medications:   Mickey, Hebel   Home Medication Instructions MVH:[id number]    Printed on:07/26/16 1642   Medication Information                      acetaminophen (TYLENOL) 325 MG tablet  Take 2 tablets (650 mg total) by mouth every 4 (four) hours as needed for Fever (temperature greater than 38 C/100.4 F).             acyclovir (ZOVIRAX) 400 MG tablet  Take 400 mg by mouth 2 (two) times daily.             Ascorbic Acid (VITAMIN C) 500 MG tablet  Take 500 mg by mouth daily.             b complex vitamins tablet  Take 1 tablet by mouth daily.             buPROPion XL (WELLBUTRIN XL) 300 MG 24 hr tablet  Take 300 mg by mouth every morning.                 Calcium 200 MG Tab  Take by mouth daily.                 cetirizine (ZYRTEC) 10 MG tablet  Take 10 mg by mouth every morning.                 clonazePAM (KLONOPIN) 1 MG tablet  Take 1 mg by mouth 3 (three) times daily as needed.                 esomeprazole (NEXIUM) 40 MG capsule  Take 40 mg by mouth every morning before breakfast.             estradiol (ESTRACE) 1 MG tablet  Take 0.5 mg by mouth every morning.Post hysterectomy                 gabapentin (NEURONTIN) 100 MG capsule  Take 100 mg by mouth nightly as needed.Pt takes it when she can't fall sleep                 gabapentin (NEURONTIN) 600 MG tablet  Take 600 mg by mouth nightly.                 HYDROmorphone (DILAUDID) 2 MG tablet  Take 1 tablet (2 mg total) by mouth every 4 (four) hours as needed for Pain.             lactobacillus/streptococcus (RISAQUAD) Cap  Take 1 capsule by mouth daily.  lamoTRIgine (LAMICTAL) 200 MG tablet  Take 200 mg by mouth 2 (two) times daily.                  levoFLOXacin (LEVAQUIN) 500 MG tablet  Take 1 tablet (500 mg total) by mouth daily.             melatonin 5 MG Tab  Take 1 tablet by mouth nightly.             metroNIDAZOLE (FLAGYL) 500 MG tablet  Take 1 tablet (500 mg total) by mouth 3 (three) times daily.             OXcarbazepine (TRILEPTAL) 150 MG tablet  Take 150 mg by mouth 2 (two) times daily.150mg  in AM, 300mg  in HS                 promethazine (PHENERGAN) 25 MG tablet  Take 1 tablet (25 mg total) by mouth every 8 (eight) hours as needed for Nausea.             sertraline (ZOLOFT) 50 MG tablet  Take 75 mg by mouth every morning.                 Thiamine HCl (VITAMIN B-1) 250 MG tablet  Take 250 mg by mouth daily.             vitamin B-12 (CYANOCOBALAMIN) 1000 MCG tablet  Take 1,000 mcg by mouth daily.             Vitamins-Lipotropics (VIT BALANCED B-100 PO)  Take by mouth.               Follow up recommendations:   Follow up:         Follow-up Information     Follow up with Blosser, Leta Speller, MD. Schedule an appointment as soon as possible for a visit in 7 days.    Specialty:  Gastroenterology    Why:  Gastroenterologist for colonoscopy    Contact information:    [address]  [address] [address]  [phone]           Results of Labs/imaging:   Labs have been reviewed:   Coagulation Profile:   Recent Labs  Lab 07/16/16  1258   PT 12.8   PT INR 1.0   PTT 28       CBC review:   Recent Labs  Lab 07/18/16  0602 07/17/16  1926 07/17/16  1255 07/17/16  0926 07/17/16  0706 07/17/16  0048  07/16/16  1258   WBC 12.95*  --   --  18.72*  --  19.21*  --  19.31*   HGB 11.7* 12.9 12.3 12.8 13.1 13.2 More results in Results Review 14.2   HEMATOCRIT 35.4* 37.8 36.7* 37.3 38.2 38.7 More results in Results Review 41.1   PLATELETS 195  --   --  231  --  257  --  251   MCV 96.5  --   --  93.5  --  93.5  --  92.6   RDW 12  --   --  12  --  12  --  12   NEUTROPHILS 72.0  --   --  82.9  --  86.0  --  87.1   LYMPHOCYTES AUTOMATED 14.7  --   --  6.2  --  4.5   --  3.9   EOSINOPHILS AUTOMATED 1.0  --   --  0.2  --  0.1  --  0.1   IMMATURE GRANULOCYTE 0.6  --   --  0.7  --  0.5  --  0.6   NEUTROPHILS ABSOLUTE 9.33*  --   --  15.54*  --  16.53*  --  16.83*   ABSOLUTE IMMATURE GRANULOCYTE 0.08*  --   --  0.13*  --  0.10*  --  0.11*   More results in Results Review = values in this interval not displayed.  Chem Review:  Recent Labs  Lab 07/18/16  0602 07/17/16  0048 07/16/16  1258   SODIUM 139 139 139   POTASSIUM 3.8 4.0 4.6   CHLORIDE 111 106 103   CO2 24 25 26    BUN 9.5 14.5 20.7*   CREATININE 0.7 0.7 0.8   GLUCOSE 74 116* 124*   CALCIUM 7.7* 8.3* 8.9   BILIRUBIN, TOTAL 0.3 0.2 0.2   AST (SGOT) 42* 39* 48*   ALT 33 42 47   ALKALINE PHOSPHATASE 88 113* 141*     Results     Procedure Component Value Units Date/Time    Comprehensive metabolic panel [[id number]]  (Abnormal) Collected:  07/18/16 0602    Specimen Information:  Blood Updated:  07/18/16 0708     Glucose 74 mg/dL      BUN 9.5 mg/dL      Creatinine 0.7 mg/dL      Sodium 295 mEq/L      Potassium 3.8 mEq/L      Chloride 111 mEq/L      CO2 24 mEq/L      Calcium 7.7 (L) mg/dL      Protein, Total 4.8 (L) g/dL      Albumin 2.4 (L) g/dL      AST (SGOT) 42 (H) U/L      ALT 33 U/L      Alkaline Phosphatase 88 U/L      Bilirubin, Total 0.3 mg/dL      Globulin 2.4 g/dL      Albumin/Globulin Ratio 1.0      Anion Gap 4.0 (L)     GFR [[id number]] Collected:  07/18/16 0602     EGFR >60.0 Updated:  07/18/16 0708    CBC and differential [[id number]]  (Abnormal) Collected:  07/18/16 0602     WBC 12.95 (H) x10 3/uL Updated:  07/18/16 0643     Hgb 11.7 (L) g/dL      Hematocrit 84.1 (L) %      Platelets 195 x10 3/uL      RBC 3.67 (L) x10 6/uL      MCV 96.5 fL      MCH 31.9 pg      MCHC 33.1 g/dL      RDW 12 %      MPV 11.0 fL      Neutrophils 72.0 %      Lymphocytes Automated 14.7 %      Monocytes 11.4 %      Eosinophils Automated 1.0 %      Basophils Automated 0.3 %      Immature Granulocyte 0.6 %      Nucleated RBC 0.0 /100 WBC       Neutrophils Absolute 9.33 (H) x10 3/uL      Abs Lymph Automated 1.90 x10 3/uL      Abs Mono Automated 1.47 (H) x10 3/uL      Abs Eos Automated 0.13 x10 3/uL      Absolute Baso Automated 0.04 x10 3/uL  Absolute Immature Granulocyte 0.08 (H) x10 3/uL      Absolute NRBC 0.00 x10 3/uL     Hemoglobin and hematocrit, blood [[id number]] Collected:  07/17/16 1926    Specimen Information:  Blood Updated:  07/17/16 1936     Hgb 12.9 g/dL      Hematocrit 40.9 %     HIV Ag/Ab 4th generation [[id number]] Collected:  07/17/16 0926     HIV Ag/Ab, 4th Generation Non-Reactive Updated:  07/17/16 1342    Hemoglobin and hematocrit, blood [[id number]]  (Abnormal) Collected:  07/17/16 1255    Specimen Information:  Blood Updated:  07/17/16 1319     Hgb 12.3 g/dL      Hematocrit 57.8 (L) %     C-REACTIVE PROTEIN HIGH SENSITIVITY [[id number]]  (Abnormal) Collected:  07/17/16 0926     C-Reactive Protein, High Sensitive 13.85 (H) mg/dL Updated:  41/32/44 0102        Radiology reports have been reviewed:  Radiology Results (24 Hour)     ** No results found for the last 24 hours. **        Ct Abd/pelvis With Iv Contrast Only    07/16/2016  Indication: Abdominal pain. Procedure: Contrast-enhanced CT of abdomen and pelvis. 100 mL of Omnipaque 350 were administered intravenously. No oral contrast material. Findings: There is severe thickening of the wall of the colon involving the entirety of the colon. There are no abnormally dilated colonic loops. Minimal stranding of the pericolonic fatty tissue. No colonic diverticula are identified. No evidence of diverticulitis. The appendix is not identified. There is no evidence of appendicitis, however. No dilated small bowel loops. No free intraperitoneal gas or free fluid. The uterus is absent. No adnexal masses or cystic lesions are seen. Unremarkable liver and gallbladder. No dilatation of the biliary tree. Unremarkable spleen, pancreas, adrenals, kidneys, and abdominal aorta. Normal cardiac size.  Sternal wires are present. There is an old, healed fracture of the posterolateral portion of the left seventh rib.     07/16/2016  1. Severe diffuse colonic wall thickening consistent with colitis. No evidence of diverticulitis. 2. Hysterectomy. Wynema Birch, MD 07/16/2016 2:10 PM     Xr Abdomen 2 View With Chest 1 View    07/17/2016  HISTORY: Abdominal pain COMPARISON: CT from 07/16/2016 FINDINGS: Supine AP and upright views of the abdomen performed. There is a nonspecific nonobstructive bowel gas pattern. There is no definite pneumoperitoneum. No definite pathologic calcification seen. No acute osseous abnormality identified. PA view of the chest performed. The patient is status post median sternotomy. The cardiac mediastinal silhouette appears normal in size. There is bibasilar airspace disease. There is no definite pleural effusion or pneumothorax. No acute osseous abnormality identified. Partial visualization made of cervical spine hardware. An old left-sided rib fracture is noted.     07/17/2016  1. Nonspecific nonobstructive bowel gas pattern. 2. Mild bibasilar airspace disease, likely atelectasis. Marius Ditch, MD 07/17/2016 11:05 AM     Pathology:   Specimens     None        Pending Lab Results:   Labs/Images to be followed at your PCP office: Unresulted Labs     None         Hospitalist:   Signed by: Neela Zecca Elmon Else Leani Myron  07/18/2016 12:40 PM  Time spent for discharge: 28 minutes      *This note was generated by the Epic EMR system/ Dragon speech recognition and may contain inherent errors or omissions not intended by  the user. Grammatical errors, random word insertions, deletions, pronoun errors and incomplete sentences are occasional consequences of this technology due to software limitations. Not all errors are caught or corrected. If there are questions or concerns about the content of this note or information contained within the body of this dictation they should be addressed directly with the  author for clarification

## 2016-07-18 NOTE — Progress Notes (Signed)
GASTROENTEROLOGY PROGRESS NOTE  [facility]  [facility]  [phone]    Date Time: 07/18/2016 9:46 AM  Patient Name: Michele Barajas, Michele Barajas        Assessment:   Abdominal pain, nausea, vomiting, diarrhea, leukocytosis - all improved   Ct scan showing severe diffuse colonic wall thickening consistent with colitis. Rule out infectious, ischemic vs inflammatory   Stool studies negative so   CRP: 13.85     Plan:   1. Advance diet   2. D/c home on abx X 10 days   3. Follow up with gi as outpatient to have colonoscopy done   4. Plan discussed with pt, all questions answered    Subjective:   Doing much better       Endoscopy:     EGD:   Colonoscopy: over 7 years ago   ERCP:      Medications:     Current Facility-Administered Medications   Medication Dose Route Frequency   . acyclovir  400 mg Oral BID   . buPROPion XL  300 mg Oral Daily   . clonazePAM  1 mg Oral TID   . dicyclomine  10 mg Oral QID   . famotidine  20 mg Oral Q12H SCH    Or   . famotidine  20 mg Intravenous Q12H SCH   . gabapentin  600 mg Oral TID   . lactobacillus/streptococcus  1 capsule Oral Daily   . lamoTRIgine  200 mg Oral BID   . levoFLOXacin  500 mg Intravenous Q24H SCH   . metroNIDAZOLE  500 mg Intravenous Q8H SCH   . OXcarbazepine  300 mg Oral BID   . sertraline  75 mg Oral Daily       Review of Systems:   A comprehensive review of systems was obtained from the patient  The patient denies any additional changes to their otic, opthalmologic, dermatologic, pulmonary, cardiac, genitourinary, musculoskeletal, hematologic, constitutional, or psychiatric systems.    Physical Exam:     Filed Vitals:    07/18/16 0935   BP: 101/67   Pulse: 80   Temp: 97.2 F (36.2 C)   Resp: 18   SpO2: 97%       General appearance - alert, in no distress,  oriented to person, place, and time  Eyes - pupils equal and reactive, sclera anicteric  Chest - clear to auscultation, no wheezes, rales or rhonchi  Heart - normal rate, regular  rhythm  Abdomen - soft, mild lower abdominal tenderness, nondistended, no masses or organomegaly, no guarding, no rebound, no peritoneal signs  Extremities - no pedal edema noted      Labs:     Results     Procedure Component Value Units Date/Time    Comprehensive metabolic panel [[id number]]  (Abnormal) Collected:  07/18/16 0602    Specimen Information:  Blood Updated:  07/18/16 0708     Glucose 74 mg/dL      BUN 9.5 mg/dL      Creatinine 0.7 mg/dL      Sodium 469 mEq/L      Potassium 3.8 mEq/L      Chloride 111 mEq/L      CO2 24 mEq/L      Calcium 7.7 (L) mg/dL      Protein, Total 4.8 (L) g/dL      Albumin 2.4 (L) g/dL      AST (SGOT) 42 (H) U/L      ALT 33 U/L      Alkaline  Phosphatase 88 U/L      Bilirubin, Total 0.3 mg/dL      Globulin 2.4 g/dL      Albumin/Globulin Ratio 1.0      Anion Gap 4.0 (L)     GFR [[id number]] Collected:  07/18/16 0602     EGFR >60.0 Updated:  07/18/16 0708    CBC and differential [[id number]]  (Abnormal) Collected:  07/18/16 0602     WBC 12.95 (H) x10 3/uL Updated:  07/18/16 0643     Hgb 11.7 (L) g/dL      Hematocrit 78.2 (L) %      Platelets 195 x10 3/uL      RBC 3.67 (L) x10 6/uL      MCV 96.5 fL      MCH 31.9 pg      MCHC 33.1 g/dL      RDW 12 %      MPV 11.0 fL      Neutrophils 72.0 %      Lymphocytes Automated 14.7 %      Monocytes 11.4 %      Eosinophils Automated 1.0 %      Basophils Automated 0.3 %      Immature Granulocyte 0.6 %      Nucleated RBC 0.0 /100 WBC      Neutrophils Absolute 9.33 (H) x10 3/uL      Abs Lymph Automated 1.90 x10 3/uL      Abs Mono Automated 1.47 (H) x10 3/uL      Abs Eos Automated 0.13 x10 3/uL      Absolute Baso Automated 0.04 x10 3/uL      Absolute Immature Granulocyte 0.08 (H) x10 3/uL      Absolute NRBC 0.00 x10 3/uL     Hemoglobin and hematocrit, blood [[id number]] Collected:  07/17/16 1926    Specimen Information:  Blood Updated:  07/17/16 1936     Hgb 12.9 g/dL      Hematocrit 57.8 %     HIV Ag/Ab 4th generation [[id number]] Collected:  07/17/16  0926     HIV Ag/Ab, 4th Generation Non-Reactive Updated:  07/17/16 1342    Hemoglobin and hematocrit, blood [[id number]]  (Abnormal) Collected:  07/17/16 1255    Specimen Information:  Blood Updated:  07/17/16 1319     Hgb 12.3 g/dL      Hematocrit 27.2 (L) %     C-REACTIVE PROTEIN HIGH SENSITIVITY [[id number]]  (Abnormal) Collected:  07/17/16 0926     C-Reactive Protein, High Sensitive 13.85 (H) mg/dL Updated:  74/25/95 6387    Ova and parasite screen [[id number]] Collected:  07/17/16 0400    Specimen Information:  Stool from Stool Updated:  07/17/16 1134    Narrative:      ORDER#: [id number]                                    ORDERED BY: Reatha Armour, BETH  SOURCE: Stool                                        COLLECTED:  07/17/16 04:00  ANTIBIOTICS AT COLL.:                                RECEIVED :  07/17/16 07:45  O and  P Screen-Cryptosporidium/Giardia Ag  FINAL       07/17/16 11:34  07/17/16   Negative for Cryptosporidium parvum antigen             Negative for Giardia lamblia antigen      CBC and differential [[id number]]  (Abnormal) Collected:  07/17/16 0926    Specimen Information:  Blood from Blood Updated:  07/17/16 0953     WBC 18.72 (H) x10 3/uL      Hgb 12.8 g/dL      Hematocrit 40.9 %      Platelets 231 x10 3/uL      RBC 3.99 (L) x10 6/uL      MCV 93.5 fL      MCH 32.1 (H) pg      MCHC 34.3 g/dL      RDW 12 %      MPV 10.1 fL      Neutrophils 82.9 %      Lymphocytes Automated 6.2 %      Monocytes 9.8 %      Eosinophils Automated 0.2 %      Basophils Automated 0.2 %      Immature Granulocyte 0.7 %      Nucleated RBC 0.0 /100 WBC      Neutrophils Absolute 15.54 (H) x10 3/uL      Abs Lymph Automated 1.16 x10 3/uL      Abs Mono Automated 1.83 (H) x10 3/uL      Abs Eos Automated 0.03 x10 3/uL      Absolute Baso Automated 0.03 x10 3/uL      Absolute Immature Granulocyte 0.13 (H) x10 3/uL      Absolute NRBC 0.00 x10 3/uL             Radiology:   Radiological Procedure reviewed.    Signed by Tenna Delaine

## 2016-07-18 NOTE — Plan of Care (Signed)
Patient aaox3, husband at bedside, both verbalized understanding of discharge instructions, medications, follow up appts. They have no questions/concerns at this time. Appreciative of care given.

## 2016-07-18 NOTE — Progress Notes (Signed)
Infectious Disease            Progress Note    07/18/2016   [name] CSN:[id number],MRN:[id number] is a 50 y.o. female, with history significant for fibromyalgia, PTSD, admitted with acute hemorrhagic colitis.    Subjective:     Michele Barajas today Symptoms:  Afebrile, feeling better, still complains of some abdominal discomfort and loose stools but improving. No shortness of breath,cough, chest pain, chest pressure. No dysuria, increase frequency. No headache, dizziness or new weakness tingling or numbness. No Rashes or ecchymosis. No joint pains, swelling. Other review of system is non contributory.    Objective:     Blood pressure 101/67, pulse 80, temperature 97.2 F (36.2 C), temperature source Oral, resp. rate 18, height 1.626 m (5\' 4" ), weight 94.6 kg (208 lb 8.9 oz), SpO2 97 %.    General Appearance: Comfortable, well-appearing and in no acute distress at present.   HEENT: Pallor negative, Anicteric sclera   Neck: Supple  Lungs: Normal respiratory rate and normal effort. Not in respiratory distress. Breath sounds clear to auscultation.   Chest Wall: Symmetric chest wall expansion.   Heart : S1 and S2. no murmurs, rub, gallop  Abdomen: Abdomen is soft, mild diffuse discomfort, bowel sounds positive.  Neurological: Alert and oriented to person, place and time. Normal strength. No gross defect.   Extremities: Normal range of motion.  Skin: Warm and dry. No rash or ecchymosis   Psychiatric:  Looks little anxious    Laboratory And Diagnostic Studies:     Recent Labs      07/18/16   0602  07/17/16   1926   07/17/16   0926   WBC  12.95*   --    --   18.72*   HGB  11.7*  12.9   < >  12.8   HEMATOCRIT  35.4*  37.8   < >  37.3   PLATELETS  195   --    --   231   NEUTROPHILS  72.0   --    --   82.9    < > = values in this interval not displayed.     Recent Labs      07/18/16   0602  07/17/16   0048   SODIUM  139  139   POTASSIUM  3.8  4.0   CHLORIDE  111  106   CO2  24  25   BUN  9.5   14.5   CREATININE  0.7  0.7   GLUCOSE  74  116*   CALCIUM  7.7*  8.3*     Recent Labs      07/18/16   0602  07/17/16   0048   AST (SGOT)  42*  39*   ALT  33  42   ALKALINE PHOSPHATASE  88  113*   PROTEIN, TOTAL  4.8*  5.3*   ALBUMIN  2.4*  3.0*   BILIRUBIN, TOTAL  0.3  0.2       Current Med's:     Current Facility-Administered Medications   Medication Dose Route Frequency   . acyclovir  400 mg Oral BID   . buPROPion XL  300 mg Oral Daily   . clonazePAM  1 mg Oral TID   . dicyclomine  10 mg Oral QID   . famotidine  20 mg Oral Q12H SCH    Or   . famotidine  20 mg Intravenous Q12H SCH   . gabapentin  600 mg  Oral TID   . lactobacillus/streptococcus  1 capsule Oral Daily   . lamoTRIgine  200 mg Oral BID   . levoFLOXacin  500 mg Intravenous Q24H SCH   . metroNIDAZOLE  500 mg Intravenous Q8H SCH   . OXcarbazepine  300 mg Oral BID   . sertraline  75 mg Oral Daily       Assessment:      Condition.   Improving   Acute colitis   PTSD   Fibromyalgia    Plan:      Continue Levaquin   Continue Flagyl   Correction of electrolytes   Continue supportive care   Discussed with Dr. Faythe Casa   Can be discharged from ID perspective with close outpatient monitoring          Khylon Davies Robinette Haines, M.D.,FACP  07/18/2016  10:04 AM          *This note was generated by the Epic EMR system/ Dragon speech recognition and may contain inherent errors or omissions not intended by the user. Grammatical errors, random word insertions, deletions, pronoun errors and incomplete sentences are occasional consequences of this technology due to software limitations. Not all errors are caught or corrected. If there are questions or concerns about the content of this note or information contained within the body of this dictation they should be addressed directly with the author for clarification

## 2016-07-18 NOTE — Plan of Care (Signed)
Problem: Pain interferes with ability to perform ADL  Goal: Pain at adequate level as identified by patient  Outcome: Progressing    07/18/16 0842   Goal/Interventions addressed this shift   Pain at adequate level as identified by patient Identify patient comfort function goal;Assess for risk of opioid induced respiratory depression, including snoring/sleep apnea. Alert healthcare team of risk factors identified.;Assess pain on admission, during daily assessment and/or before any "as needed" intervention(s);Reassess pain within 30-60 minutes of any procedure/intervention, per Pain Assessment, Intervention, Reassessment (AIR) Cycle;Evaluate if patient comfort function goal is met;Evaluate patient's satisfaction with pain management progress;Offer non-pharmacological pain management interventions;Consult/collaborate with Pain Service;Consult/collaborate with Physical Therapy, Occupational Therapy, and/or Speech Therapy;Include patient/patient care companion in decisions related to pain management as needed         Problem: Safety  Goal: Patient will be free from injury during hospitalization  Outcome: Progressing    07/18/16 0842   Goal/Interventions addressed this shift   Patient will be free from injury during hospitalization  Assess patient's risk for falls and implement fall prevention plan of care per policy;Provide and maintain safe environment;Ensure appropriate safety devices are available at the bedside;Use appropriate transfer methods;Include patient/ family/ care giver in decisions related to safety;Hourly rounding;Assess for patients risk for elopement and implement Elopement Risk Plan per policy;Provide alternative method of communication if needed (communication boards, writing)

## 2016-07-19 SURGERY — DONT USE, USE 1094-COLONOSCOPY, DIAGNOSTIC (SCREENING)
Anesthesia: Monitor Anesthesia Care | Site: Anus

## 2016-07-22 ENCOUNTER — Ambulatory Visit: Payer: Commercial Managed Care - POS
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
## 2016-07-23 ENCOUNTER — Ambulatory Visit: Payer: Commercial Managed Care - POS

## 2016-07-23 ENCOUNTER — Ambulatory Visit (INDEPENDENT_AMBULATORY_CARE_PROVIDER_SITE_OTHER): Payer: Commercial Managed Care - POS | Admitting: "Endocrinology

## 2016-07-23 ENCOUNTER — Encounter (INDEPENDENT_AMBULATORY_CARE_PROVIDER_SITE_OTHER): Payer: Self-pay | Admitting: "Endocrinology

## 2016-07-23 ENCOUNTER — Encounter (INDEPENDENT_AMBULATORY_CARE_PROVIDER_SITE_OTHER): Payer: Self-pay

## 2016-07-23 ENCOUNTER — Ambulatory Visit (INDEPENDENT_AMBULATORY_CARE_PROVIDER_SITE_OTHER): Payer: Commercial Managed Care - POS

## 2016-07-23 VITALS — BP 123/82 | HR 100 | Temp 98.9°F | Resp 14 | Wt 205.6 lb

## 2016-07-23 DIAGNOSIS — D72829 Elevated white blood cell count, unspecified: Secondary | ICD-10-CM

## 2016-07-23 NOTE — Progress Notes (Addendum)
History of Present Illness:     This patient is a 50 y.o. female, PMH significant for migraine, concussion, anxiety/depression, fibromyalgia, aortic aneurysm s/p repair in 2015, here for her initial [facility] visit after her recent hospitalization at [facility], 8/29 to 07/17/16 for hemorrhagic colitis.  GI, and ID were consulted, she was managed on IV Levaquin and Flagyl, and continued on oral Levaquin and Flagyl as outpatient to complete 10 day course.  She was scheduled for an outpatient colonoscopy.     Patient presents to clinic today with the following:    Hemorrhagic colitis  -- continues to have intermittent abdominal pain, on Dilaudid 2 mg q4hours  -- bloody diarrhea has resolved, no melena or hematochezia  -- no nausea or vomiting, no fever or chills  -- completing course of Levaquin and Flagyl with no issues  -- +ve family hx of colon cancer, mother had colon CA about 15 years ago, now in remission  -- patient's last colonoscopy was 7 years ago which was unremarkable    Anxiety/depression  -- stable on her current psych meds and is established with psychiatry.    Medical clearance for colonoscopy  -- requesting medical clearance for colonoscopy tomorrow.  -- states she has had several colonoscopies done in the past due to mother's hx of colon cancer.   -- she has had no complications with anesthesia in the past, no family hx of complications with anesthesia, no complications from previous colonoscopy.   -- EKG reviewed, which is unremarkable  -- no hx of cardiac disease   -- spoke with Dr. Rachel Moulds, recommended to go ahead and clear patient for colonoscopy, not at risk for cardiac events.          Review of Systems:     Review of Systems   Constitutional: Negative for fever, chills, malaise/fatigue and diaphoresis.   Respiratory: Negative for shortness of breath.    Cardiovascular: Negative for chest pain and palpitations.   Gastrointestinal: Positive for abdominal pain. Negative for  heartburn, nausea, vomiting, diarrhea, constipation, blood in stool and melena.   Neurological: Negative for dizziness, tingling, weakness and headaches.       Physical Exam:     Filed Vitals:    07/23/16 1031   BP: 123/82   Pulse: 100   Temp: 98.9 F (37.2 C)   Resp: 14   SpO2: 98%       Wt Readings from Last 3 Encounters:   07/23/16 93.26 kg (205 lb 9.6 oz)   07/22/16 91.173 kg (201 lb)   07/18/16 94.6 kg (208 lb 8.9 oz)       General: awake, alert, oriented x 3  HEENT: perrla, eomi, sclera anicteric,oropharynx clear without lesions, mucous membranes moist  Neck: supple, no lymphadenopathy, no thyromegaly, no JVD, no carotid bruits  Cardiovascular: S1, S2, regular rate and rhythm, no murmurs, rubs, or gallops  Lungs: clear to auscultation bilaterally, without wheezing, rhonchi, or rales  Abdomen: soft, tender on the lower abdominal quadrant, normal bowel sounds  Extremities: no clubbing, cyanosis, or edema  Neuro: cranial nerves grossly intact, strength 5/5 in upper and lower extremities, sensation intact  Skin: no rashes or lesions noted    Diagnostics:     Lab Results   Component Value Date    WBC 12.95* 07/18/2016    HGB 11.7* 07/18/2016    HCT 35.4* 07/18/2016    PLT 195 07/18/2016    ALT 33 07/18/2016    AST 42* 07/18/2016  NA 139 07/18/2016    K 3.8 07/18/2016    CL 111 07/18/2016    CREAT 0.7 07/18/2016    BUN 9.5 07/18/2016    CO2 24 07/18/2016    INR 1.0 07/16/2016    GLU 74 07/18/2016       Ct Abd/pelvis With Iv Contrast Only    07/16/2016  1. Severe diffuse colonic wall thickening consistent with colitis. No evidence of diverticulitis. 2. Hysterectomy. Wynema Birch, MD 07/16/2016 2:10 PM     Xr Abdomen 2 View With Chest 1 View    07/17/2016  1. Nonspecific nonobstructive bowel gas pattern. 2. Mild bibasilar airspace disease, likely atelectasis. Marius Ditch, MD 07/17/2016 11:05 AM       Assessment:     Patient Active Problem List   Diagnosis   . Colitis   . Leukocytosis   . Acute  hemorrhagic colitis   . Leukocytosis, unspecified type       Plan:     Lulla was seen today for ulcerative colitis.    Diagnoses and all orders for this visit:    Leukocytosis, unspecified type  -     CBC without differential    1) Colitis - complete course of Levaquin and Flagyl as prescribed; f/u with GI as recommended; will have colonoscopy tomorrow    2) Anxiety/depression - stable - continue management per psych     3) Establish care with PCP     4) Cleared for colonoscopy per discussion with Dr. Rachel Moulds     Follow up: N/A  Discharged from Transitional Clinic?: Yes  Medical Home Referred to/Referred Back to: IMG     Med Rec Complete: Yes    Teach back for HF, COPD, DM, AMI, PNA: N/A

## 2016-07-23 NOTE — Patient Instructions (Signed)
1) Continue to take all your medications as prescribed.     2) Go for your colonoscopy tomorrow as planned.

## 2016-07-23 NOTE — Progress Notes (Signed)
Nurse case coordinator introduce self to patient and Case coordinating services from [facility]. Patient verb und in role  Dispo: Meeting with patient to discuss transition /disposition plan with patient. Patient does not have a PCP . Provided with information regarding [facility] and [facility]. A list of Specialists also provided to patient.  Review instructions from provider with patient. All questions answered.Patient verb und and agreed with plan of care.  Labs collected during visit from Left antecubital area. Pt tolerated procedure well with no issues/concerns.  All questions answered.Patient verb und and agreed with plan of care.

## 2016-07-24 ENCOUNTER — Ambulatory Visit: Payer: Commercial Managed Care - POS | Admitting: Certified Registered"

## 2016-07-24 ENCOUNTER — Ambulatory Visit
Admission: RE | Admit: 2016-07-24 | Discharge: 2016-07-24 | Disposition: A | Discharge status: Home / Self Care | Payer: Commercial Managed Care - POS | Source: Ambulatory Visit | Attending: Gastroenterology | Admitting: Gastroenterology

## 2016-07-24 ENCOUNTER — Ambulatory Visit: Payer: Self-pay

## 2016-07-24 ENCOUNTER — Ambulatory Visit: Payer: Commercial Managed Care - POS | Admitting: Gastroenterology

## 2016-07-24 ENCOUNTER — Encounter
Admission: RE | Disposition: A | Discharge status: Home / Self Care | Payer: Self-pay | Source: Ambulatory Visit | Attending: Gastroenterology

## 2016-07-24 DIAGNOSIS — Z800 Family history of malignant neoplasm of digestive organs: Secondary | ICD-10-CM | POA: Insufficient documentation

## 2016-07-24 DIAGNOSIS — K5190 Ulcerative colitis, unspecified, without complications: Secondary | ICD-10-CM

## 2016-07-24 DIAGNOSIS — K529 Noninfective gastroenteritis and colitis, unspecified: Secondary | ICD-10-CM | POA: Insufficient documentation

## 2016-07-24 DIAGNOSIS — K633 Ulcer of intestine: Secondary | ICD-10-CM | POA: Insufficient documentation

## 2016-07-24 DIAGNOSIS — R197 Diarrhea, unspecified: Secondary | ICD-10-CM | POA: Insufficient documentation

## 2016-07-24 DIAGNOSIS — K921 Melena: Secondary | ICD-10-CM | POA: Insufficient documentation

## 2016-07-24 DIAGNOSIS — K5730 Diverticulosis of large intestine without perforation or abscess without bleeding: Secondary | ICD-10-CM | POA: Insufficient documentation

## 2016-07-24 LAB — CBC AND DIFFERENTIAL
Absolute NRBC: 0 10*3/uL
Basophils Absolute Automated: 0.06 10*3/uL (ref 0.00–0.20)
Basophils Automated: 0.6 %
Eosinophils Absolute Automated: 0.19 10*3/uL (ref 0.00–0.70)
Eosinophils Automated: 2 %
Hematocrit: 39.6 % (ref 37.0–47.0)
Hgb: 13.5 g/dL (ref 12.0–16.0)
Immature Granulocytes Absolute: 0.23 10*3/uL — ABNORMAL HIGH
Immature Granulocytes: 2.4 %
Lymphocytes Absolute Automated: 2.32 10*3/uL (ref 0.50–4.40)
Lymphocytes Automated: 24.2 %
MCH: 31.6 pg (ref 28.0–32.0)
MCHC: 34.1 g/dL (ref 32.0–36.0)
MCV: 92.7 fL (ref 80.0–100.0)
MPV: 9.8 fL (ref 9.4–12.3)
Monocytes Absolute Automated: 1.41 10*3/uL — ABNORMAL HIGH (ref 0.00–1.20)
Monocytes: 14.7 %
Neutrophils Absolute: 5.36 10*3/uL (ref 1.80–8.10)
Neutrophils: 56.1 %
Nucleated RBC: 0 /100 WBC (ref 0.0–1.0)
Platelets: 310 10*3/uL (ref 140–400)
RBC: 4.27 10*6/uL (ref 4.20–5.40)
RDW: 13 % (ref 12–15)
WBC: 9.57 10*3/uL (ref 3.50–10.80)

## 2016-07-24 LAB — HEPATITIS B SURFACE ANTIGEN W/ REFLEX TO CONFIRMATION: Hepatitis B Surface Antigen: NONREACTIVE

## 2016-07-24 LAB — COMPREHENSIVE METABOLIC PANEL
ALT: 31 U/L (ref 0–55)
AST (SGOT): 29 U/L (ref 5–34)
Albumin/Globulin Ratio: 1.3 (ref 0.9–2.2)
Albumin: 3.3 g/dL — ABNORMAL LOW (ref 3.5–5.0)
Alkaline Phosphatase: 146 U/L — ABNORMAL HIGH (ref 37–106)
Anion Gap: 11 (ref 5.0–15.0)
BUN: 7.9 mg/dL (ref 7.0–19.0)
Bilirubin, Total: 0.3 mg/dL (ref 0.2–1.2)
CO2: 25 mEq/L (ref 22–29)
Calcium: 9 mg/dL (ref 8.5–10.5)
Chloride: 106 mEq/L (ref 100–111)
Creatinine: 0.9 mg/dL (ref 0.6–1.0)
Globulin: 2.5 g/dL (ref 2.0–3.6)
Glucose: 85 mg/dL (ref 70–100)
Potassium: 4 mEq/L (ref 3.5–5.1)
Protein, Total: 5.8 g/dL — ABNORMAL LOW (ref 6.0–8.3)
Sodium: 142 mEq/L (ref 136–145)

## 2016-07-24 LAB — CBC
Hematocrit: 42.8 % (ref 34.0–46.6)
Hemoglobin: 14.4 g/dL (ref 11.1–15.9)
MCH: 31.1 pg (ref 26.6–33.0)
MCHC: 33.6 g/dL (ref 31.5–35.7)
MCV: 92 fL (ref 79–97)
Platelets: 363 10*3/uL (ref 150–379)
RBC: 4.63 x10E6/uL (ref 3.77–5.28)
RDW: 13.8 % (ref 12.3–15.4)
WBC: 10.8 10*3/uL (ref 3.4–10.8)

## 2016-07-24 LAB — GFR: EGFR: >60

## 2016-07-24 LAB — HEPATITIS B SURFACE ANTIBODY: HEPATITIS B SURFACE ANTIBODY: <8

## 2016-07-24 LAB — C-REACTIVE PROTEIN: C-Reactive Protein: 3.2 mg/dL — ABNORMAL HIGH (ref 0.0–0.8)

## 2016-07-24 LAB — HEMOLYSIS INDEX: Hemolysis Index: 14 (ref 0–18)

## 2016-07-24 SURGERY — DONT USE, USE 1094-COLONOSCOPY, DIAGNOSTIC (SCREENING)
Anesthesia: Anesthesia General | Site: Anus | Wound class: Clean Contaminated

## 2016-07-24 MED ORDER — LIDOCAINE HCL 2 % IJ SOLN
INTRAMUSCULAR | Status: DC | PRN
Start: 2016-07-24 — End: 2016-07-24
  Administered 2016-07-24: 100 mg

## 2016-07-24 MED ORDER — PROPOFOL INFUSION 10 MG/ML
INTRAVENOUS | Status: DC | PRN
Start: 2016-07-24 — End: 2016-07-24
  Administered 2016-07-24 (×4): 20 mg via INTRAVENOUS
  Administered 2016-07-24: 30 mg via INTRAVENOUS
  Administered 2016-07-24: 20 mg via INTRAVENOUS
  Administered 2016-07-24: 40 mg via INTRAVENOUS
  Administered 2016-07-24 (×4): 20 mg via INTRAVENOUS
  Administered 2016-07-24: 30 mg via INTRAVENOUS
  Administered 2016-07-24: 20 mg via INTRAVENOUS
  Administered 2016-07-24: 40 mg via INTRAVENOUS
  Administered 2016-07-24: 30 mg via INTRAVENOUS
  Administered 2016-07-24: 80 mg via INTRAVENOUS

## 2016-07-24 MED ORDER — LIDOCAINE 1% BUFFERED - CNR/OUTSOURCED
1.0000 mL | Freq: Once | INTRAMUSCULAR | Status: AC
Start: 2016-07-24 — End: 2016-07-24
  Administered 2016-07-24: 1 mL via INTRADERMAL

## 2016-07-24 MED ORDER — LACTATED RINGERS IV SOLN
1000.0000 mL | INTRAVENOUS | Status: DC
Start: 2016-07-24 — End: 2016-07-24
  Administered 2016-07-24: 1000 mL via INTRAVENOUS

## 2016-07-24 SURGICAL SUPPLY — 48 items
BASIN EME PLS 700ML LF GRAD TRNLU PGMNT (Patient Supply) ×1
BASIN EMESIS 700 ML GRADUATED TRANSLUCENT PIGMENT FREE PLASTIC (Patient Supply) ×1 IMPLANT
BASIN EMESIS LF (Patient Supply) ×1
CANNISTER SUCTION 1500CC (Suction)
CONTAINER HISTOLOGY 60 ML 30 ML GRADUATE LEAK RESISTANT O RING PREFILL (Procedure Accessories) IMPLANT
FORCEP HOT BIOPSY RJ4 (Endoscopic Supplies)
FORCEPS BIOPSY L240 CM +2.8 MM HOT OD2.2 (Endoscopic Supplies)
FORCEPS BIOPSY L240 CM +2.8 MM HOT OD2.2 MM RADIAL JAW (Endoscopic Supplies) IMPLANT
FORCEPS BIOPSY L240 CM JUMBO MICROMESH (Instrument)
FORCEPS BIOPSY L240 CM JUMBO MICROMESH TEETH STREAMLINE CATHETER (Instrument) IMPLANT
FORCEPS BIOPSY L240 CM LARGE CAPACITY (Instrument)
FORCEPS BIOPSY L240 CM MICROMESH TEETH STREAMLINE CATHETER NEEDLE (Instrument) IMPLANT
FORCEPS BIOPSY L240 CM STANDARD CAPACITY (Instrument) ×1
FORCEPS JAW RADIAL JUMBO (Instrument)
FORCEPS RAD JAW 4 BIOSPY W/NDL (Instrument)
FORCEPS RADIAL JAW 3 W/ NEEDLE (Endoscopic Supplies) IMPLANT
FORCEPS RADIAL JAW 4 2.8MM (Instrument) ×1
GOWN ISO YELLOW UNIVERSAL (Gown) ×2 IMPLANT
GOWN ISOLATION XL HOOK LOOP NECK KNIT (Patient Supply) IMPLANT
GOWN XL - NON STERILE (Patient Supply) ×2
KIT SMARTPREP APC 30 30ML (Kits) IMPLANT
LINER SCT 1500CC THNWL MDVC FLX ADV LF (Suction) IMPLANT
MARKER ENDOSCOPIC PERMANENT INDICATION (Syringes, Needles)
MARKER ENDOSCOPIC PERMANENT INDICATION DARK SYRINGE SPOT EX 5 ML (Syringes, Needles) IMPLANT
NEEDLE INJC DISP NM LOWER GI (Needles) IMPLANT
NET SPECIMEN RETRIEVAL L230 CM STANDARD (Urology Supply)
NET SPECIMEN RETRIEVAL L230 CM STANDARD SHEATH OD2.5 MM L6 CM X W3 CM (Urology Supply) IMPLANT
PAD ELECTROSRG GRND REM W CRD (Procedure Accessories) IMPLANT
RETRIEVER ROTH NET STD 2.5MM (Urology Supply)
SNARE CAPTIFLEX 27MM (Endoscopic Supplies)
SNARE CAPTIVATOR 13MMX240CM (GE Lab Supplies)
SNARE ESCP MIC CPTVTR 13MM 240IN STRL (GE Lab Supplies)
SNARE MD OVAL 240CM 2.4 MM CPTFLX LP FLXBL ENDOSCOPIC POLYPECTOMY 27MM (Endoscopic Supplies) IMPLANT
SNARE MEDIUM OVAL L240 CM OD2.4 MM (Endoscopic Supplies)
SNARE SMALL HEXAGON CAPTIVATOR STIFF ENDOSCOPIC POLYPECTOMY (GE Lab Supplies) IMPLANT
SOL FORMALIN 10% PREFILL 30ML (Procedure Accessories)
SOLN LUBRICATING JELLY 4.25OZ (Irrigation Solutions) ×2 IMPLANT
SPONGE GAUZE L4 IN X W4 IN 4 PLY HIGH (Sponge) ×1
SPONGE GAUZE L4 IN X W4 IN 4 PLY NONWOVEN LINT FREE CURITY RAYON (Sponge) ×1 IMPLANT
SPONGE GAUZE VERSA 4PLY 4X4 (Sponge) ×1
SYRING SPOT EX ENDO MARK 5CC (Syringes, Needles)
SYRINGE 50 ML GRADUATE NONPYROGENIC DEHP (Syringes, Needles) ×1
SYRINGE 50 ML GRADUATE NONPYROGENIC DEHP FREE PVC FREE BD MEDICAL (Syringes, Needles) IMPLANT
SYRINGE SLIP-TIP 60CC (Syringes, Needles) ×1
TRAP SPECIMEN LF (Procedure Accessories) IMPLANT
VALVE AIR/WATER DEFENDO KIT SUCTION (Suction) ×1
VALVE AIR/WATER DEFENDO KIT SUCTION BUTTON BIOPSY (Suction) IMPLANT
VALVE DEFENDO SUCTION AIR/WTR (Suction) ×1

## 2016-07-24 NOTE — Anesthesia Postprocedure Evaluation (Signed)
Anesthesia Post Evaluation    Patient: Michele Barajas    Procedure(s) with comments:  COLONOSCOPY - bx    Anesthesia type: general    Last Vitals:   Filed Vitals:    07/24/16 0827   BP: 114/61   Pulse: 70   Temp:    Resp: 16   SpO2: 97%       Patient Location: Phase II PACU      Post Pain: Patient not complaining of pain, continue current therapy    Mental Status: awake and alert    Respiratory Function: tolerating room air    Cardiovascular: stable    Nausea/Vomiting: patient not complaining of nausea or vomiting    Hydration Status: adequate    Post Assessment: no apparent anesthetic complications, no reportable events and no evidence of recall          Anesthesia Qualified Clinical Data Registry    Central Line      CVC insertion : NO                                               Perioperative temperature management      General/neuraxial anesthesia > or = 60 minutes (excluding CABG) : NO                                Administration of antibiotic prophylaxis      Age > or = 18, with IV access, with surgical procedure for which antibiotic prophylaxis indicated, and not on chronic antibiotics : NO                  Medication Administration      Ordering or administration of drug inconsistent with intended drug, dose, delivery or timing : NO      Dental loss/damage      Dental injury with administration of anesthesia : NO      Difficult intubation due to unrecognized difficult airway        Elective airway procedure including but not limited to: tracheostomy, fiberoptic bronchoscopy, rigid bronchoscopy; jet ventilation; or elective use of a device to facilitate airway management such as a Glidescope : NO                > Unanticipated difficult intubation post pre-evaluation : NO      Aspiration of gastric contents        Aspiration of gastric contents : NO                    Surgical fire        Procedure requiring electrocautery/laser : YES                > Ignition/burning in invasive procedure location :  NO      Immediate perioperative cardiac arrest        Cardiac arrest in OR or PACU : NO                    Unplanned hospital admission        Unplanned hospital admission for initially intended outpatient anesthesia service : NO      Unplanned ICU admission        Unplanned ICU admission related to anesthesia occurring within 24 hours of induction or  start of MAC : NO      Surgical case cancellation        Cancellation of procedure after care already started by anesthesia care team : NO      Post-anesthesia transfer of care checklist/protocol to PACU        Transfer from OR to PACU upon case conclusion : YES              > Use of PACU transfer checklist/protocol : NO     (Includes the key elements of: patient identification, responsible practitioner identification (PACU nurse or advanced practitioner), discussion of pertinent history and procedure course, intraoperative anesthetic management, post-procedure plans, acknowledgement/questions)    Post-anesthesia transfer of care checklist/protocol to ICU        Transfer from OR to ICU upon case conclusion : NO                    Post-operative nausea/vomiting risk protocol        Post-operative nausea/vomiting risk protocol : NO  Patient > or = 18 with care initiated by anesthesia team that has a risk factor screen for post-op nausea/vomiting (Includes female, hx PONV, or motion sickness, non-smoker, intended opioid administration for post-op analgesia.)    Anaphylaxis        Anaphylaxis during anesthesia services : NO    (Inclusive of any suspected transfusion reaction in association with blood-bank confirmed blood product incompatibility)              Franne Grip, 07/24/2016 8:29 AM

## 2016-07-24 NOTE — Discharge Instructions (Signed)
Post Anesthesia Discharge Instructions    Although you may be awake and alert in the recovery room, small amounts of anesthetic remain in your system for about 24 hours.  You may feel tired and sleepy during this time.      You are advised to go directly home from the hospital.    Plan to stay at home and rest for the remainder of the day.    It is advisable to have someone with you at home for 24 hours after surgery.    Do not operate a motor vehicle, or any mechanical or electrical equipment for the next 24 hours.      Be careful when you are walking around, you may become dizzy.  The effects of anesthesia and/or medications are still present and drowsiness may occur    Do not consume alcohol, tranquilizers, sleeping medications, or any other non prescribed medication for the remainder of the day.    Diet:  begin with liquids, progress your diet as tolerated or as directed by your surgeon.  Nausea and vomiting may occur in the next 24 hours.      Colonoscopy     A camera attached to a flexible tube with a viewing lens is used to take video pictures.     Colonoscopyis a test to view the inside of your lower digestive tract (colon and rectum).Sometimes it can show the last part of the small intestine (ileum).During the test, small pieces of tissue may be removed for testing. This is called a biopsy. Small growths, such as polyps, may also be removed.  Why is colonoscopy done?  The test is done to help look for colon cancer. And it can help find the source of abdominal pain,bleeding,and changes in bowel habits. It may be needed once a year, depending on factors such as your:   Age   Health history   Family health history   Symptoms   Results from any prior colonoscopy  Risks and possible complications  These include:   Bleeding   A puncture or tear in the colon   Risks of anesthesia   A cancer lesion not being seen  Getting ready  To prepare for the test:   Talk with your healthcare  provider about the risks of the test (see below). Also ask your healthcare provider about alternatives to the test.   Tell your healthcare provider about any medicines you take. Alsotell him or her about any health conditions you may have.   Make sure your rectum and colon are empty for the test. Follow the diet and bowel prep instructions exactly. If you don't, the test may need to be rescheduled.   Plan for a friend or family member to drive you home after the test.     Colonoscopy provides an inside view of the entire colon.     You may discuss the results with your doctor right away or at a future visit.  During the test  The test is usually done in the hospital on an outpatient basis. This means you go home the same day. The procedure takes about . During that time:   You are given relaxing (sedating) medicine through an IV line.You may be drowsy, or fully asleep.   The healthcare provider will first give you a physical exam to check for anal andrectal problems.   Then the anus is lubricated and the scope inserted.   If you are awake, you may have a feeling similar to  needing to have a bowelmovement. You may also feel pressure as air is pumped into the colon. It'sOK to pass gas during the procedure.   Biopsy, polyp removal, or other treatments may be done during the test.  After the test  You may have gas right after the test. It can help to try to pass it to help prevent later bloating. Your healthcare provider may discuss the results with you right away. Or you may need to schedule a follow-up visit to talk about the results. After the test, you can go back to your normal eating andother activities. You may be tired from the sedation and need to rest for a few hours.  Date Last Reviewed: 09/19/2015   2000-2016 [employer] [address], [place], [place] [address]. All rights reserved. This information is not intended as a substitute for professional medical care.  Always follow your healthcare professional's instructions.

## 2016-07-24 NOTE — Op Note (Signed)
GI Attending:  S/p colonoscopy, findings:  - diverticulosis in cecum  - segmental colitis with ulcerations in the descending colon, bx for path and HSV and CMV  - random colon biopsies from transverse colon and sigmoid (normal appearing mucosa)  Plan:  Check CRP, ASCA, ANCA, CBC, CMP  Follow up with me in clinic     Dr. Francis Dowse

## 2016-07-24 NOTE — H&P (Signed)
GI PRE PROCEDURE NOTE    Proceduralist Comments:   Review of Systems and Past Medical / Surgical History performed: Yes     Indications:diarrhea, hematochezia and family history of colon cancer    Previous Adverse Reaction to Anesthesia or Sedation (if yes, describe): No    Physical Exam / Laboratory Data (If applicable)   Airway Classification: Class II    General: Alert and cooperative  Lungs: Lungs clear to auscultation  Cardiac: RRR, normal S1S2.    Abdomen: Soft, non tender. Normal active bowel sounds  Other:     No labs drawn    Planned Sedation:   Deep sedation with anesthesia    Attestation:   Michele Barajas has been reassessed immediately prior to the procedure and is an appropriate candidate for the planned sedation and procedure. Risks, benefits and alternatives to the planned procedure and sedation have been explained to the patient or guardian:  yes    Signed by: Ilda Basset

## 2016-07-24 NOTE — Transfer of Care (Signed)
Anesthesia Transfer of Care Note    Patient: Michele Barajas    Procedures performed: Procedure(s) with comments:  COLONOSCOPY - bx    Anesthesia type: General TIVA    Patient location:Phase II PACU    Last vitals:   Filed Vitals:    07/24/16 0827   BP: 114/61   Pulse: 70   Temp:    Resp: 16   SpO2: 97%       Post pain: Patient not complaining of pain, continue current therapy      Mental Status:awake    Respiratory Function: tolerating room air    Cardiovascular: stable    Nausea/Vomiting: patient not complaining of nausea or vomiting    Hydration Status: adequate    Post assessment: no apparent anesthetic complicationsto rr with hob elevated, conversant, comfortable    Signed by: Franne Grip  07/24/2016 8:29 AM

## 2016-07-25 ENCOUNTER — Ambulatory Visit: Payer: Commercial Managed Care - POS

## 2016-07-25 ENCOUNTER — Encounter: Payer: Self-pay | Admitting: Gastroenterology

## 2016-07-26 LAB — SACCHAROMYCES CEREVISIAE AB, IGG, S: ASCA, IgG: 10.4 U (ref <=20.0)

## 2016-07-26 LAB — QUANTIFERON(R)-TB GOLD ITM (HLAB)
Mitogen-NIL: >10
NIL: 0.05
Quantiferon: NEGATIVE
TB Ag-Nil: 0

## 2016-07-26 LAB — ANCA SCREEN W/REFLEX TO TITER (SOFT): ANCA Screen: NEGATIVE

## 2016-07-26 LAB — LAB USE ONLY - HISTORICAL SURGICAL PATHOLOGY

## 2016-07-30 ENCOUNTER — Ambulatory Visit: Payer: Commercial Managed Care - POS

## 2016-08-01 ENCOUNTER — Ambulatory Visit: Payer: Commercial Managed Care - POS

## 2016-08-06 ENCOUNTER — Ambulatory Visit: Payer: Commercial Managed Care - POS

## 2016-08-08 ENCOUNTER — Ambulatory Visit: Payer: Commercial Managed Care - POS

## 2016-08-13 ENCOUNTER — Ambulatory Visit: Payer: Commercial Managed Care - POS

## 2016-08-15 ENCOUNTER — Ambulatory Visit: Payer: Commercial Managed Care - POS

## 2016-09-03 ENCOUNTER — Encounter (FREE_STANDING_LABORATORY_FACILITY): Payer: Commercial Managed Care - POS

## 2016-09-03 DIAGNOSIS — K2970 Gastritis, unspecified, without bleeding: Secondary | ICD-10-CM

## 2016-09-03 DIAGNOSIS — K259 Gastric ulcer, unspecified as acute or chronic, without hemorrhage or perforation: Secondary | ICD-10-CM

## 2016-09-03 DIAGNOSIS — K210 Gastro-esophageal reflux disease with esophagitis: Secondary | ICD-10-CM

## 2016-09-03 HISTORY — PX: ESOPHAGOGASTRODUODENOSCOPY: SHX1529

## 2016-09-04 LAB — SURGICAL PATHOLOGY EXAM

## 2016-09-06 LAB — LAB USE ONLY - HISTORICAL SURGICAL PATHOLOGY

## 2016-09-09 ENCOUNTER — Encounter (INDEPENDENT_AMBULATORY_CARE_PROVIDER_SITE_OTHER): Payer: Self-pay | Admitting: Cardiovascular Disease

## 2016-09-09 ENCOUNTER — Ambulatory Visit (INDEPENDENT_AMBULATORY_CARE_PROVIDER_SITE_OTHER): Payer: Commercial Managed Care - POS | Admitting: Cardiovascular Disease

## 2016-09-09 VITALS — HR 77 | Temp 48.6°F | Resp 18 | Ht 64.0 in | Wt 207.0 lb

## 2016-09-09 DIAGNOSIS — Z7689 Persons encountering health services in other specified circumstances: Secondary | ICD-10-CM

## 2016-09-09 DIAGNOSIS — Z9079 Acquired absence of other genital organ(s): Secondary | ICD-10-CM

## 2016-09-09 DIAGNOSIS — I351 Nonrheumatic aortic (valve) insufficiency: Secondary | ICD-10-CM

## 2016-09-09 DIAGNOSIS — F339 Major depressive disorder, recurrent, unspecified: Secondary | ICD-10-CM

## 2016-09-09 DIAGNOSIS — G8929 Other chronic pain: Secondary | ICD-10-CM

## 2016-09-09 DIAGNOSIS — F411 Generalized anxiety disorder: Secondary | ICD-10-CM

## 2016-09-09 DIAGNOSIS — M1990 Unspecified osteoarthritis, unspecified site: Secondary | ICD-10-CM

## 2016-09-09 DIAGNOSIS — G4700 Insomnia, unspecified: Secondary | ICD-10-CM

## 2016-09-09 DIAGNOSIS — Z9889 Other specified postprocedural states: Secondary | ICD-10-CM

## 2016-09-09 DIAGNOSIS — Z8679 Personal history of other diseases of the circulatory system: Secondary | ICD-10-CM

## 2016-09-09 DIAGNOSIS — G43911 Migraine, unspecified, intractable, with status migrainosus: Secondary | ICD-10-CM

## 2016-09-09 DIAGNOSIS — Z90710 Acquired absence of both cervix and uterus: Secondary | ICD-10-CM

## 2016-09-09 DIAGNOSIS — M545 Low back pain: Secondary | ICD-10-CM

## 2016-09-09 DIAGNOSIS — F4312 Post-traumatic stress disorder, chronic: Secondary | ICD-10-CM

## 2016-09-09 DIAGNOSIS — Z8739 Personal history of other diseases of the musculoskeletal system and connective tissue: Secondary | ICD-10-CM

## 2016-09-09 DIAGNOSIS — M069 Rheumatoid arthritis, unspecified: Secondary | ICD-10-CM

## 2016-09-09 DIAGNOSIS — Z90722 Acquired absence of ovaries, bilateral: Secondary | ICD-10-CM
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
## 2017-09-01 DIAGNOSIS — I712 Thoracic aortic aneurysm, without rupture, unspecified: Secondary | ICD-10-CM

## 2017-09-01 DIAGNOSIS — F3341 Major depressive disorder, recurrent, in partial remission: Secondary | ICD-10-CM

## 2017-09-01 HISTORY — DX: Thoracic aortic aneurysm, without rupture: I71.2

## 2017-09-01 HISTORY — DX: Major depressive disorder, recurrent, in partial remission: F33.41

## 2017-09-01 HISTORY — DX: Thoracic aortic aneurysm, without rupture, unspecified: I71.20

## 2017-10-02 DIAGNOSIS — Q231 Congenital insufficiency of aortic valve: Secondary | ICD-10-CM

## 2017-10-02 HISTORY — DX: Congenital insufficiency of aortic valve: Q23.1

## 2018-12-23 DIAGNOSIS — R110 Nausea: Secondary | ICD-10-CM

## 2018-12-23 DIAGNOSIS — M2550 Pain in unspecified joint: Secondary | ICD-10-CM

## 2018-12-23 DIAGNOSIS — R7689 Other specified abnormal immunological findings in serum: Secondary | ICD-10-CM | POA: Insufficient documentation

## 2018-12-23 DIAGNOSIS — R768 Other specified abnormal immunological findings in serum: Secondary | ICD-10-CM

## 2018-12-23 DIAGNOSIS — H04123 Dry eye syndrome of bilateral lacrimal glands: Secondary | ICD-10-CM

## 2018-12-23 HISTORY — DX: Dry eye syndrome of bilateral lacrimal glands: H04.123

## 2018-12-23 HISTORY — DX: Other specified abnormal immunological findings in serum: R76.8

## 2018-12-23 HISTORY — DX: Pain in unspecified joint: M25.50

## 2018-12-23 HISTORY — DX: Nausea: R11.0

## 2019-01-15 DIAGNOSIS — E871 Hypo-osmolality and hyponatremia: Secondary | ICD-10-CM

## 2019-01-15 HISTORY — DX: Hypo-osmolality and hyponatremia: E87.1

## 2019-01-16 DIAGNOSIS — G8929 Other chronic pain: Secondary | ICD-10-CM

## 2019-01-16 DIAGNOSIS — M545 Low back pain, unspecified: Secondary | ICD-10-CM | POA: Insufficient documentation

## 2019-01-16 HISTORY — DX: Other chronic pain: G89.29

## 2019-01-16 HISTORY — DX: Low back pain, unspecified: M54.50

## 2019-03-16 DIAGNOSIS — F0781 Postconcussional syndrome: Secondary | ICD-10-CM

## 2019-03-16 DIAGNOSIS — G44309 Post-traumatic headache, unspecified, not intractable: Secondary | ICD-10-CM | POA: Insufficient documentation

## 2019-03-16 DIAGNOSIS — S069XAS Unspecified intracranial injury with loss of consciousness status unknown, sequela: Secondary | ICD-10-CM | POA: Insufficient documentation

## 2019-03-16 DIAGNOSIS — F332 Major depressive disorder, recurrent severe without psychotic features: Secondary | ICD-10-CM | POA: Insufficient documentation

## 2019-03-16 DIAGNOSIS — F4310 Post-traumatic stress disorder, unspecified: Secondary | ICD-10-CM

## 2019-03-16 DIAGNOSIS — S069X0S Unspecified intracranial injury without loss of consciousness, sequela: Secondary | ICD-10-CM

## 2019-03-16 HISTORY — DX: Post-traumatic headache, unspecified, not intractable: G44.309

## 2019-03-16 HISTORY — DX: Major depressive disorder, recurrent severe without psychotic features: F33.2

## 2019-03-16 HISTORY — DX: Unspecified intracranial injury with loss of consciousness status unknown, sequela: S06.9XAS

## 2019-03-16 HISTORY — DX: Post-traumatic headache, unspecified, not intractable: S06.9X0S

## 2019-03-16 HISTORY — DX: Postconcussional syndrome: F07.81

## 2019-03-16 HISTORY — DX: Post-traumatic stress disorder, unspecified: F43.10

## 2019-10-13 DIAGNOSIS — Z6841 Body Mass Index (BMI) 40.0 and over, adult: Secondary | ICD-10-CM

## 2019-10-13 HISTORY — DX: Body Mass Index (BMI) 40.0 and over, adult: Z684

## 2020-03-15 ENCOUNTER — Other Ambulatory Visit: Payer: Self-pay

## 2020-03-17 ENCOUNTER — Telehealth: Payer: Self-pay | Admitting: Cardiology

## 2020-03-17 ENCOUNTER — Ambulatory Visit (INDEPENDENT_AMBULATORY_CARE_PROVIDER_SITE_OTHER): Payer: 59 | Admitting: Cardiology

## 2020-03-17 ENCOUNTER — Encounter: Payer: Self-pay | Admitting: Cardiology

## 2020-03-17 ENCOUNTER — Other Ambulatory Visit: Payer: Self-pay

## 2020-03-17 DIAGNOSIS — I7102 Dissection of abdominal aorta: Secondary | ICD-10-CM | POA: Diagnosis not present

## 2020-03-17 DIAGNOSIS — Z8679 Personal history of other diseases of the circulatory system: Secondary | ICD-10-CM

## 2020-03-17 DIAGNOSIS — E782 Mixed hyperlipidemia: Secondary | ICD-10-CM

## 2020-03-17 DIAGNOSIS — Z9889 Other specified postprocedural states: Secondary | ICD-10-CM

## 2020-03-17 HISTORY — DX: Personal history of other diseases of the circulatory system: Z86.79

## 2020-03-17 HISTORY — DX: Mixed hyperlipidemia: E78.2

## 2020-03-17 MED ORDER — ASPIRIN EC 81 MG PO TBEC
81.0000 mg | DELAYED_RELEASE_TABLET | Freq: Every day | ORAL | 3 refills | Status: DC
Start: 2020-03-17 — End: 2021-07-13

## 2020-03-17 NOTE — Progress Notes (Signed)
Cardiology Office Note:    Date:  03/17/2020   ID:  Audrey Cannon, DOB 1966/01/02, MRN [id number]  PCP:  Lucianne Lei, MD  Cardiologist:  Garwin Brothers, MD   Referring MD: Lucianne Lei, MD    ASSESSMENT:    1. H/O aortic aneurysm repair   2. Mixed dyslipidemia    PLAN:    In order of problems listed above:  1. Primary prevention stressed with the patient.  Importance of compliance with diet and medication stressed and he vocalized understanding.  Importance of regular exercise stressed and weight reduction stressed and she promises to do better.  Her blood pressure is stable. 2. S/p aortic aortic aneurysm repair and possibly aortic valve repair.  I do not have the details of the surgery.  Last echocardiogram done 2 years ago was unremarkable.  Will follow up with an echocardiogram.  This will help me assess the aortic valve and the aortic root. 3. Mixed dyslipidemia: Lipids were mildly elevated however she mentions to me that her overall lipids are fine and I reviewed them from [facility] lab work.  I told her to diet and exercise and lose weight and we will recheck this in 3 to 4 months and initiate statins if necessary.  Her LDL has always been very much on the lower side in the 70s in the 80s and that is what she told me and I confirmed it on the [facility] records. 4. Patient will be seen in follow-up appointment in 6 months or earlier if the patient has any concerns    Medication Adjustments/Labs and Tests Ordered: Current medicines are reviewed at length with the patient today.  Concerns regarding medicines are outlined above.  No orders of the defined types were placed in this encounter.  No orders of the defined types were placed in this encounter.    No chief complaint on file.    History of Present Illness:    Audrey Cannon is a 54 y.o. female.  Patient has past medical history of aortic aneurysm repair in a [place] hospital.  She denies any problems at  this time and is here to be established.  No chest pain orthopnea or PND.  She used to live in [place] and had her cardiac care at [facility].  At the time of my evaluation, the patient is alert awake oriented and in no distress.  She tells me that she walks on a regular basis.  Past Medical History:  Diagnosis Date  . Acne 10/14/2012  . Acute hemorrhagic colitis 07/16/2016  . 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 DAVID DOMINGUEZ, NP ([phone]) [place] [facility] of this note might be different from the original. DAVID DOMINGUEZ, NP ([phone]) [place]  . Bicuspid aortic valve 10/02/2017  . Bilateral dry eyes 12/23/2018  . BMI 40.0-44.9, adult (HCC) 10/13/2019  . 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 Cervical dystonia 02/03/2013  . 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 Chronic low back pain 01/16/2019  . Chronic nausea 12/23/2018  . DDD (degenerative disc disease), cervical 06/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 C5-6 fusion 10/2010 [facility];(hx [facility] BOTOX INJECTIONS, CHIROPRACTER, MASSAGE [place]) Formatting of this note might be different from the original. C5-6 fusion 10/2010 [facility] Neurosurgery;(hx [facility] BOTOX INJECTIONS, CHIROPRACTER, MASSAGE [place])  . Elevated white blood cell count, unspecified 07/16/2016  . Fibromyalgia 06/26/2009   Formatting  of this note might be different from the original. Rheum work up does not show a connective tissue disorder or other rheumatological condition  . Genital herpes 06/26/2009  . 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 Healthcare maintenance 09/18/2009   Formatting of this note might be different from the original. colonoscopy 2007 negative, repeat Nov 2011 negative (fam hx, repeat 2016)  . High risk medications (not anticoagulants) long-term use 05/08/2011   Formatting of this note might be different from  the original. [facility] Medicine (contract scanned into [employer]);#120 Oxycodone/month;Most recent UDS: 04/2011 (appropriate) Formatting of this note might be different from the original. [facility] Medicine (contract scanned into [employer]CIS);#120 Oxycodone/month;Most recent UDS: 04/2011 (appropriate)  . Hyponatremia 01/15/2019  . Hypothyroidism 09/10/2011  . MDD (major depressive disorder), recurrent episode, severe (HCC) 03/16/2019  . Migraine 06/26/2009  . Oral aphthae 08/15/2011  . Polyarthralgia 12/23/2018  . Post concussion syndrome 03/16/2019  . Post traumatic stress disorder (PTSD) 03/16/2019  . Recurrent major depressive disorder, in partial remission (HCC) 09/01/2017  . States associated with artificial menopause 06/26/2009  . TBI (traumatic brain injury) (HCC) 11/01/2015  . Thoracic aortic aneurysm without rupture (HCC) 09/01/2017   Formatting of this note might be different from the original. H/o repair 2/15 outside records indicate moderate aortic regurgitation and aneurysm measuring 4.6 cm by CT.  Patient had significant dyspnea on exertion and had replacement of the ascending aorta with 26 Hemashield graft.  The aortic valve was "healthy "with minor calcification small median raphae with slight right-left cusp fusion but   . Unspecified osteoarthritis, unspecified site 06/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 R knee microfracture surgery/lateral patellar release 01/2013 [facility]  . Upper GI bleeding 06/26/2009     Current Medications: Current Meds  Medication Sig  . acyclovir (ZOVIRAX) 400 MG tablet Take 400 mg by mouth 2 (two) times daily.  Marland Kitchen albuterol (ACCUNEB) 1.25 MG/3ML nebulizer solution Take 1 ampule by nebulization every 6 (six) hours as needed.  . B Complex-C (B-COMPLEX WITH VITAMIN C) tablet Take 1 tablet by mouth daily.  Marland Kitchen esomeprazole (NEXIUM) 40 MG capsule Take 40 mg by mouth 2 (two) times daily.  Marland Kitchen estradiol (ESTRACE) 0.5 MG  tablet Take 0.5 mg by mouth daily.  . furosemide (LASIX) 20 MG tablet Take 20 mg by mouth daily.  Marland Kitchen gabapentin (NEURONTIN) 300 MG capsule Take 300 mg by mouth 2 (two) times daily. Takes 300mg  in am and 600mg  in pm  . hydrOXYzine (VISTARIL) 50 MG capsule Take 1 capsule by mouth 4 (four) times daily as needed for anxiety.  Ketorolac Tromethamine 15.75 MG/SPRAY SOLN Place 15.75 mg into the nose every 6 (six) hours as needed.  . lamoTRIgine (LAMICTAL) 200 MG tablet Take 200 mg by mouth 2 (two) times daily.  . Melatonin 5 MG CAPS Take 5 mg by mouth daily.  . methocarbamol (ROBAXIN) 750 MG tablet Take 750 mg by mouth in the morning, at noon, in the evening, and at bedtime.  . methylphenidate 36 MG PO CR tablet Take 72 mg by mouth every morning.  . metoCLOPramide (REGLAN) 10 MG tablet Take 10 mg by mouth 4 (four) times daily as needed.  morphine (MS CONTIN) 15 MG 12 hr tablet Take 15 mg by mouth every 12 (twelve) hours.  Marland Kitchen morphine (MSIR) 15 MG tablet Take 1-2 tablets by mouth every 6 (six) hours as needed.  . Multiple Vitamins-Minerals (CENTRUM MEN) TABS  Take 1 tablet by mouth daily.  . ondansetron (ZOFRAN-ODT) 8 MG disintegrating tablet Take 8 mg by mouth 3 (three) times daily.  . Oxcarbazepine (TRILEPTAL) 300 MG tablet Take 300 mg by mouth 2 (two) times daily.  . promethazine (PHENERGAN) 12.5 MG tablet Take 12.5 mg by mouth daily as needed.  . traZODone (DESYREL) 150 MG tablet Take 150 mg by mouth at bedtime.  . traZODone (DESYREL) 50 MG tablet Take 50 mg by mouth as needed for sleep.  . [DISCONTINUED] B Complex Vitamins (VITAMIN B COMPLEX) TABS Take 1 tablet by mouth daily.  . [DISCONTINUED] cetirizine (ZYRTEC) 10 MG tablet Take 10 mg by mouth in the morning.  . [DISCONTINUED] gabapentin (NEURONTIN) 800 MG tablet Take 800 mg by mouth daily.  . [DISCONTINUED] Multiple Vitamin (MULTIVITAMIN) capsule Take 1 capsule by mouth daily.     Allergies:   Celecoxib, Hydrochlorothiazide, Other,  Cefpodoxime, Cefuroxime, Cymbalta [duloxetine hcl], Fentanyl, Nsaids, Serotonin reuptake inhibitors (ssris), Codeine, and Duloxetine   Social History   Socioeconomic History  . Marital status: Married    Spouse name: Not on file  . Number of children: Not on file  . Years of education: Not on file  . Highest education level: Not on file  Occupational History  . Not on file  Tobacco Use  . Smoking status: Never Smoker  . Smokeless tobacco: Never Used  Substance and Sexual Activity  . Alcohol use: Never  . Drug use: Never  . Sexual activity: Not on file  Other Topics Concern  . Not on file  Social History Narrative  . Not on file   Social Determinants of Health   Financial Resource Strain:   . Difficulty of Paying Living Expenses:   Food Insecurity:   . Worried About Programme researcher, broadcasting/film/video in the Last Year:   . Barista in the Last Year:   Transportation Needs:   . Freight forwarder (Medical):   Marland Kitchen Lack of Transportation (Non-Medical):   Physical Activity:   . Days of Exercise per Week:   . Minutes of Exercise per Session:   Stress:   . Feeling of Stress :   Social Connections:   . Frequency of Communication with Friends and Family:   . Frequency of Social Gatherings with Friends and Family:   . Attends Religious Services:   . Active Member of Clubs or Organizations:   . Attends Banker Meetings:   Marland Kitchen Marital Status:      Family History: The patient's family history includes Arthritis in her father; Cancer in her father; Colon cancer in her mother; Hypertension in her mother; Seizures in her sister; Stroke in her mother.  ROS:   Please see the history of present illness.    All other systems reviewed and are negative.  EKG reveals sinus rhythm and nonspecific ST-T changes and LVH  EKGs/Labs/Other Studies Reviewed:    The following studies were reviewed today: Patient:   OTTILIA, PIPPENGER Med Rec#:   [id number]        DOB; Age:    25-Nov-1965 53y yr.  Proc Date:  01/06/2019       Account#:   [id number]       Height:    165.1 cm / 65.0 in Accession#:  [id number]     Weight:    93.9 kg / 207.0 lbs Sex:     F           BSA:     2.01  Pt. Type:   Outpatient Exam Loc:   [facility]  Reading:   Royetta Crochet, MD Referring:  [facility] Sonographer: Corinna Gab ______________________________________________________________________  Transthoracic Echocardiogram  CPT Code(s): * Echocardiogram Comp with Color Flow Doppler and Spectral Doppler (93306)  Indication: DYSPNEA HR:      75  Summary:  1. There is mild, concentric left ventricular hypertrophy. 2. The left ventricular chamber size is normal. 3. Global left ventricular wall motion and contractility are within normal limits. 4. The ejection fraction is estimated to be 55%. 5. Normal left ventricular diastolic filling is observed. 6. The right ventricular chamber size and systolic function are within normal limits. 7. The left atrium is normal in size with no visual thrombus identified. 8. The right atrium appears normal. 9. The aortic valve is trileaflet. 10. There is mild sclerosis of the aortic valve cusps. 11. Mild aortic regurgitation is present. 12. There is no evidence of aortic stenosis. 13. The mitral valve leaflets appear normal. 14. There is a trivial amount of mitral regurgitation. 15. There is trivial tricuspid regurgitation. 16. The right ventricular systolic pressure is estimated to be 20-25 mmHg.  17. The pulmonic valve appears normal in structure and function. There is no pulmonic stenosis or regurgitation present. 18. There is a trivial amount of pulmonic regurgitation. 19. The pericardium appears normal. 20. The aortic root appears normal. 21. The inferior vena cava is normal in size with >50% inspiratory collapse, suggestive of a normal right atrial  pressure (2mmHg). 22. No evidence of thrombus, intra-cardiac shunting or vegetation.   Recent Labs: No results found for requested labs within last 8760 hours.  Recent Lipid Panel No results found for: CHOL, TRIG, HDL, CHOLHDL, VLDL, LDLCALC, LDLDIRECT  Physical Exam:    VS:  BP 130/84 (BP Location: Right Arm, Patient Position: Sitting, Cuff Size: Normal)   Pulse 94   Ht 5\' 4"  (1.626 m)   Wt 205 lb (93 kg)   SpO2 96%   BMI 35.19 kg/m     Wt Readings from Last 3 Encounters:  03/17/20 205 lb (93 kg)     GEN: Patient is in no acute distress HEENT: Normal NECK: No JVD; No carotid bruits LYMPHATICS: No lymphadenopathy CARDIAC: Hear sounds regular, 2/6 systolic murmur at the apex. RESPIRATORY:  Clear to auscultation without rales, wheezing or rhonchi  ABDOMEN: Soft, non-tender, non-distended MUSCULOSKELETAL:  No edema; No deformity  SKIN: Warm and dry NEUROLOGIC:  Alert and oriented x 3 PSYCHIATRIC:  Normal affect   Signed, Jenean Lindau, MD  03/17/2020 4:06 PM    [facility]

## 2020-03-17 NOTE — Telephone Encounter (Signed)
New Message    Pt is calling and says she just left the office and noticed the new cholesterol medication Dr Tomie China told her she would start taking would be sent to the pharmacy but she does not see it on her medication list and thinks he forgot to send it in    Please advise

## 2020-03-17 NOTE — Addendum Note (Signed)
Addended by: Eleonore Chiquito on: 03/17/2020 05:16 PM   Modules accepted: Orders

## 2020-03-17 NOTE — Telephone Encounter (Signed)
Please see Dr Kem Parkinson note from today.   I told her to diet and exercise and lose weight and we will recheck this in 3 to 4 months and initiate statins if necessary.    Message left for patient to call back.     Pt called back information from Dr Kem Parkinson note provided to patient and she agrees to plan.

## 2020-03-17 NOTE — Patient Instructions (Signed)
Medication Instructions:  Your physician has recommended you make the following change in your medication:  Take 81 mg coated aspirin daily  *If you need a refill on your cardiac medications before your next appointment, please call your pharmacy*   Lab Work: None ordered If you have labs (blood work) drawn today and your tests are completely normal, you will receive your results only by: Marland Kitchen MyChart Message (if you have MyChart) OR . A paper copy in the mail If you have any lab test that is abnormal or we need to change your treatment, we will call you to review the results.   Testing/Procedures: Your physician has requested that you have an echocardiogram. Echocardiography is a painless test that uses sound waves to create images of your heart. It provides your doctor with information about the size and shape of your heart and how well your heart's chambers and valves are working. This procedure takes approximately one hour. There are no restrictions for this procedure.     Follow-Up: At [facility], you and your health needs are our priority.  As part of our continuing mission to provide you with exceptional heart care, we have created designated Provider Care Teams.  These Care Teams include your primary Cardiologist (physician) and Advanced Practice Providers (APPs -  Physician Assistants and Nurse Practitioners) who all work together to provide you with the care you need, when you need it.  We recommend signing up for the patient portal called "MyChart".  Sign up information is provided on this After Visit Summary.  MyChart is used to connect with patients for Virtual Visits (Telemedicine).  Patients are able to view lab/test results, encounter notes, upcoming appointments, etc.  Non-urgent messages can be sent to your provider as well.   To learn more about what you can do with MyChart, go to [email or web address].    Your next appointment:   6 month(s)  The format for your  next appointment:   In Person  Provider:   Belva Crome, MD   Other Instructions  Echocardiogram An echocardiogram is a procedure that uses painless sound waves (ultrasound) to produce an image of the heart. Images from an echocardiogram can provide important information about:  Signs of coronary artery disease (CAD).  Aneurysm detection. An aneurysm is a weak or damaged part of an artery wall that bulges out from the normal force of blood pumping through the body.  Heart size and shape. Changes in the size or shape of the heart can be associated with certain conditions, including heart failure, aneurysm, and CAD.  Heart muscle function.  Heart valve function.  Signs of a past heart attack.  Fluid buildup around the heart.  Thickening of the heart muscle.  A tumor or infectious growth around the heart valves. Tell a health care provider about:  Any allergies you have.  All medicines you are taking, including vitamins, herbs, eye drops, creams, and over-the-counter medicines.  Any blood disorders you have.  Any surgeries you have had.  Any medical conditions you have.  Whether you are pregnant or may be pregnant. What are the risks? Generally, this is a safe procedure. However, problems may occur, including:  Allergic reaction to dye (contrast) that may be used during the procedure. What happens before the procedure? No specific preparation is needed. You may eat and drink normally. What happens during the procedure?   An IV tube may be inserted into one of your veins.  You may receive contrast through this tube.  A contrast is an injection that improves the quality of the pictures from your heart.  A gel will be applied to your chest.  A wand-like tool (transducer) will be moved over your chest. The gel will help to transmit the sound waves from the transducer.  The sound waves will harmlessly bounce off of your heart to allow the heart images to be  captured in real-time motion. The images will be recorded on a computer. The procedure may vary among health care providers and hospitals. What happens after the procedure?  You may return to your normal, everyday life, including diet, activities, and medicines, unless your health care provider tells you not to do that. Summary  An echocardiogram is a procedure that uses painless sound waves (ultrasound) to produce an image of the heart.  Images from an echocardiogram can provide important information about the size and shape of your heart, heart muscle function, heart valve function, and fluid buildup around your heart.  You do not need to do anything to prepare before this procedure. You may eat and drink normally.  After the echocardiogram is completed, you may return to your normal, everyday life, unless your health care provider tells you not to do that. This information is not intended to replace advice given to you by your health care provider. Make sure you discuss any questions you have with your health care provider. Document Revised: 02/25/2019 Document Reviewed: 12/07/2016 Elsevier Patient Education  [place].

## 2020-04-03 ENCOUNTER — Other Ambulatory Visit: Payer: Medicare Other

## 2020-04-19 ENCOUNTER — Ambulatory Visit (INDEPENDENT_AMBULATORY_CARE_PROVIDER_SITE_OTHER): Payer: 59

## 2020-04-19 ENCOUNTER — Other Ambulatory Visit: Payer: Self-pay

## 2020-04-19 DIAGNOSIS — Z8679 Personal history of other diseases of the circulatory system: Secondary | ICD-10-CM | POA: Diagnosis not present

## 2020-04-19 DIAGNOSIS — I7102 Dissection of abdominal aorta: Secondary | ICD-10-CM

## 2020-04-19 NOTE — Progress Notes (Signed)
  Echocardiogram 2D Echocardiogram has been performed.  Leta Jungling [facility]  04/19/2020 14:05

## 2020-06-27 ENCOUNTER — Ambulatory Visit: Payer: Medicare Other | Admitting: Rheumatology

## 2020-07-19 ENCOUNTER — Telehealth: Payer: Self-pay | Admitting: Gastroenterology

## 2020-07-19 NOTE — Telephone Encounter (Signed)
Hey Dr Chales Abrahams, this pt is being referred for dysphagia but pt was last seen by [facility] on 01/2020, records are in epic for review. Please advise on scheduling.

## 2020-07-30 NOTE — Telephone Encounter (Signed)
OK to schedule with me or APP clinic, whichever is faster RG

## 2020-08-09 ENCOUNTER — Encounter: Payer: Self-pay | Admitting: Gastroenterology

## 2020-08-28 ENCOUNTER — Institutional Professional Consult (permissible substitution): Payer: 59 | Admitting: Pulmonary Disease

## 2020-10-09 ENCOUNTER — Ambulatory Visit: Payer: 59 | Admitting: Gastroenterology

## 2021-04-04 ENCOUNTER — Ambulatory Visit: Payer: Self-pay | Admitting: Sports Medicine

## 2021-04-11 ENCOUNTER — Encounter: Payer: Self-pay | Admitting: Sports Medicine

## 2021-04-11 ENCOUNTER — Ambulatory Visit (INDEPENDENT_AMBULATORY_CARE_PROVIDER_SITE_OTHER): Payer: 59 | Admitting: Sports Medicine

## 2021-04-11 ENCOUNTER — Other Ambulatory Visit: Payer: Self-pay

## 2021-04-11 DIAGNOSIS — M25571 Pain in right ankle and joints of right foot: Secondary | ICD-10-CM | POA: Diagnosis not present

## 2021-04-11 DIAGNOSIS — S82401A Unspecified fracture of shaft of right fibula, initial encounter for closed fracture: Secondary | ICD-10-CM

## 2021-04-11 DIAGNOSIS — M79604 Pain in right leg: Secondary | ICD-10-CM

## 2021-04-11 DIAGNOSIS — S82201A Unspecified fracture of shaft of right tibia, initial encounter for closed fracture: Secondary | ICD-10-CM | POA: Diagnosis not present

## 2021-04-11 DIAGNOSIS — S82101A Unspecified fracture of upper end of right tibia, initial encounter for closed fracture: Secondary | ICD-10-CM | POA: Diagnosis not present

## 2021-04-11 DIAGNOSIS — S92901A Unspecified fracture of right foot, initial encounter for closed fracture: Secondary | ICD-10-CM

## 2021-04-11 NOTE — Progress Notes (Signed)
Subjective: Audrey Cannon is a 55 y.o. female patient who presents to office for evaluation of Right leg pain.  Patient reports that she had an injury on March 31 where her left knee focal and she twisted and fell on her right side ended up getting admitted to the hospital and was stabilized and casted and has been following with orthopedics for a tibia fracture patient reports that she was always complaining of pain in her ankle but orthopedics failed to pay any attention to the pain in her ankle and they remove the cast on Friday and told her that she can walk with the walking boot however patient cannot even stand and put pressure on her foot and ankle due to the pain through the entire leg patient states that pain radiates up from her ankle to her leg and that her chronic pain medications currently on morphine and has a nerve modulator device and still very painful.  Patient is assisted by husband this visit who helps to report history.  Patient is really concerned about the lack of healing and reports that she was recently diagnosed as well with rheumatoid arthritis.  Review of systems as above.  Patient Active Problem List   Diagnosis Date Noted  . H/O aortic aneurysm repair 03/17/2020  . Mixed dyslipidemia 03/17/2020  . BMI 40.0-44.9, adult (HCC) 10/13/2019  . 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 MDD (major depressive disorder), recurrent episode, severe (HCC) 03/16/2019  . Post concussion syndrome 03/16/2019  . Post traumatic stress disorder (PTSD) 03/16/2019  . Chronic low back pain 01/16/2019  . Hyponatremia 01/15/2019  . ANA positive 12/23/2018  . Bilateral dry eyes 12/23/2018  . Chronic nausea 12/23/2018  . Polyarthralgia 12/23/2018  . Bicuspid aortic valve 10/02/2017  . Recurrent major depressive disorder, in partial remission (HCC) 09/01/2017  . Thoracic aortic aneurysm without rupture (HCC) 09/01/2017  . Acute hemorrhagic colitis 07/16/2016  . Elevated white  blood cell count, unspecified 07/16/2016  . TBI (traumatic brain injury) (HCC) 11/01/2015  . Cervical dystonia 02/03/2013  . 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 Acne 10/14/2012  . Hypothyroidism 09/10/2011  . Oral aphthae 08/15/2011  . High risk medications (not anticoagulants) long-term use 05/08/2011  . 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 Healthcare maintenance 09/18/2009  . 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 DDD (degenerative disc disease), cervical 06/26/2009  . Fibromyalgia 06/26/2009  . Genital herpes 06/26/2009  . Migraine 06/26/2009  . States associated with artificial menopause 06/26/2009  . Unspecified osteoarthritis, unspecified site 06/26/2009  . Upper GI bleeding 06/26/2009    Current Outpatient Medications on File Prior to Visit  Medication Sig Dispense Refill  . acyclovir (ZOVIRAX) 400 MG tablet Take 400 mg by mouth 2 (two) times daily.    Marland Kitchen albuterol (ACCUNEB) 1.25 MG/3ML nebulizer solution Take 1 ampule by nebulization every 6 (six) hours as needed.    Marland Kitchen aspirin EC 81 MG tablet Take 1 tablet (81 mg total) by mouth daily. 90 tablet 3  . B Complex-C (B-COMPLEX WITH VITAMIN C) tablet Take 1 tablet by mouth daily.    Marland Kitchen esomeprazole (NEXIUM) 40 MG capsule Take 40 mg by mouth 2 (two) times daily.    Marland Kitchen estradiol (ESTRACE) 0.5 MG tablet Take 0.5 mg by mouth daily.    . furosemide (LASIX) 20 MG tablet Take 20 mg by mouth daily.    Marland Kitchen gabapentin (NEURONTIN) 300 MG capsule Take 300 mg by mouth 2 (two) times daily. Takes 300mg   in am and 600mg  in pm    . hydrOXYzine (VISTARIL) 50 MG capsule Take 1 capsule by mouth 4 (four) times daily as needed for anxiety.    Ketorolac Tromethamine 15.75 MG/SPRAY SOLN Place 15.75 mg into the nose every 6 (six) hours as needed.    . lamoTRIgine (LAMICTAL) 200 MG tablet Take 200 mg by mouth 2 (two) times daily.    . Melatonin 5 MG CAPS Take 5 mg by mouth daily.    . methocarbamol (ROBAXIN) 750 MG tablet Take 750  mg by mouth in the morning, at noon, in the evening, and at bedtime.    . methylphenidate 36 MG PO CR tablet Take 72 mg by mouth every morning.    . metoCLOPramide (REGLAN) 10 MG tablet Take 10 mg by mouth 4 (four) times daily as needed.    Marland Kitchen morphine (MS CONTIN) 15 MG 12 hr tablet Take 15 mg by mouth every 12 (twelve) hours.    Marland Kitchen morphine (MSIR) 15 MG tablet Take 1-2 tablets by mouth every 6 (six) hours as needed.    . Multiple Vitamins-Minerals (CENTRUM MEN) TABS Take 1 tablet by mouth daily.    . ondansetron (ZOFRAN-ODT) 8 MG disintegrating tablet Take 8 mg by mouth 3 (three) times daily.    . Oxcarbazepine (TRILEPTAL) 300 MG tablet Take 300 mg by mouth 2 (two) times daily.    . promethazine (PHENERGAN) 12.5 MG tablet Take 12.5 mg by mouth daily as needed.    . traZODone (DESYREL) 150 MG tablet Take 150 mg by mouth at bedtime.    . traZODone (DESYREL) 50 MG tablet Take 50 mg by mouth as needed for sleep.     No current facility-administered medications on file prior to visit.    Allergies  Allergen Reactions  . Celecoxib Rash    Acute generalized. exanthematous Acute generalized. exanthematous Acute generalized. exanthematous Acute generalized. exanthematous   . Hydrochlorothiazide Other (See Comments)  . Other Other (See Comments)    Other reaction(s): Other (See Comments) Vomiting/ GI bleed Other reaction(s): Other (See Comments) Vomiting/ GI bleed Vomiting/ GI bleed Other reaction(s): Other (See Comments) Vomiting/ GI bleed Vomiting/ GI bleed Vomiting/ GI bleed Other reaction(s): Other (See Comments) Vomiting/ GI bleed Vomiting/ GI bleed   . Cefpodoxime Other (See Comments)    Trouble breathing, fast/pounding heartbeat, nausea, blurry vision, slowed speech, stomach cramps, confusion, inability to lift head off pillow  . Cefuroxime Other (See Comments)    Tachycardia, dark urine, nausea, severe diarrhea, stomach pain and cramping, severe listlessness, weakness, slurred  speech, confusion, slept 14 hours, unable to walk alone  . Cymbalta [Duloxetine Hcl] Other (See Comments)    No appetite, inability to stay awake, nausea, severe itching, dark urine, extremelty weak, shaky, severe headache  . Fentanyl     "rapid heartbeat"  . Nsaids Other (See Comments)    Gastric bleeds  . Serotonin Reuptake Inhibitors (Ssris) Other (See Comments)    Nausea, dizziness, severe fatigue, anxiety, lethargy, HA, severe agitation, excessive sleeping  . Codeine Itching  . Duloxetine Itching, Nausea And Vomiting and Rash    Hard to wake up, couldn't walk Hard to wake up, couldn't walk. (AGEP) Hard to wake up, couldn't walk Hard to wake up, couldn't walk. (AGEP) Hard to wake up, couldn't walk Hard to wake up, couldn't walk. (AGEP) Hard to wake up, couldn't walk Hard to wake up, couldn't walk. (AGEP)     Objective:  General: Alert and oriented x3 in no acute  distress  Dermatology: There is a small purple abrasion noted to the anterior shin of the right leg.  No open lesions bilateral lower extremities, no webspace macerations, no ecchymosis bilateral, all nails x 10 are well manicured.  Vascular: Focal edema noted to right lower extremity. Dorsalis Pedis and Posterior Tibial pedal pulses 1/4, Capillary Fill Time 3 seconds,(+) scant pedal hair growth bilateral, Temperature gradient within normal limits.  Neurology: Michaell Cowing sensation intact via light touch bilateral.  Moderate  Musculoskeletal: There is moderate to severe tenderness with palpation at right leg along the tibia and there is also pain distally at the ankle diffusely, there is pain with attempting to even stand on the right foot.  There is severe guarding with light touch on the right.  Gait: Wheelchair assisted  [facility] from [facility] D[facility]/19/2021 Consistent with fracture of the tibia  Also reviewed x-rays from patient's office visit on Friday which reveals nonhealing fracture of the midshaft  of the tibia of the right lower extremity  Assessment and Plan: Problem List Items Addressed This Visit   None   Visit Diagnoses    Acute right ankle pain    -  Primary   Relevant Orders   DG Ankle Complete Right   Tibia/fibula fracture, right, closed, initial encounter       Closed fracture of proximal end of right tibia, unspecified fracture morphology, initial encounter       Right leg pain           -Complete examination performed -Xrays reviewed -Discussed treatement options for fracture; risks, alternatives, and benefits explained. -Order CT of the lower extremity for further evaluation of the extent of healing of these fractures since the injury happened greater than 6 weeks ago and there is no evidence of healing noted on x-ray most recently taken that patient showed me on their phone -Electrical engineer boot to the right lower extremity to keep intact for 5 days if she can tolerate it -Advised patient to continue with nonweightbearing -May use cam boot to protect the extremity -Continue with chronic pain medications -Recommend protection, rest, ice, elevation daily until symptoms improve -Patient to follow-up after CT scan or sooner if problems or issues arise.  Asencion Islam, DPM

## 2021-04-12 ENCOUNTER — Telehealth: Payer: Self-pay

## 2021-04-12 NOTE — Addendum Note (Signed)
Addended by: Asencion Islam T on: 04/12/2021 08:19 AM   Modules accepted: Orders

## 2021-04-12 NOTE — Telephone Encounter (Signed)
Contacted patient's insurance company to obtained authorization for CT of Rt Lower extremity w/o contrast.([id number]) Authorization was not required  Ref# [id number] Contacted [facility] and scheduled pt's appt for 04/18/21 for checking in at 8:30 for a 9 appt.  Contacted pt and advised her of her appt date and time, pt was instructed of location address and phone number, pt stated understanding  Faxed order form to [facility]

## 2021-04-13 ENCOUNTER — Encounter: Payer: Self-pay | Admitting: Sports Medicine

## 2021-04-17 ENCOUNTER — Ambulatory Visit: Payer: Medicare Other | Admitting: Sports Medicine

## 2021-04-17 ENCOUNTER — Telehealth: Payer: Self-pay

## 2021-04-17 NOTE — Telephone Encounter (Signed)
Christy from [facility] imaging center called stating CT of Rt leg needs prior authorization. I informed Neysa Bonito I had contacted pt's insurance and was informed it did not required an authorization, but would call back with reference number provided by Neysa Bonito [phone]).  Contacted patient's insurance company and obtained authorization for CT  Auth# [id number] Exp:5/31-8/29 Contacted [facility] and informed them of new information.

## 2021-04-20 ENCOUNTER — Ambulatory Visit (INDEPENDENT_AMBULATORY_CARE_PROVIDER_SITE_OTHER): Payer: 59 | Admitting: Sports Medicine

## 2021-04-20 ENCOUNTER — Other Ambulatory Visit: Payer: Self-pay

## 2021-04-20 DIAGNOSIS — S82251D Displaced comminuted fracture of shaft of right tibia, subsequent encounter for closed fracture with routine healing: Secondary | ICD-10-CM | POA: Diagnosis not present

## 2021-04-20 DIAGNOSIS — M25571 Pain in right ankle and joints of right foot: Secondary | ICD-10-CM

## 2021-04-20 DIAGNOSIS — M79604 Pain in right leg: Secondary | ICD-10-CM

## 2021-04-20 NOTE — Progress Notes (Signed)
Subjective: Audrey Cannon is a 55 y.o. female patient who presents to office for follow-up evaluation of Right leg pain.  Patient had an injury on March 31 and was treated by [facility] orthopedics and was told that she could start to weight-bear however pain continued to worsen and she decided to come to my office for my opinion.  Patient had a CT scan performed 2 days ago of which I ordered which reveals continued fracture.  Patient reports that her leg is still in a lots of pain along the anterior shin as well as anytime that she tries to stand there is significant pain in her ankle.  Patient reports that the soft cast that was placed last visit made her legs swell more and that she could only keep it on for 1 to 2 days.  Patient reports that rest ice elevation and staying off of her foot seems to help with the pain in her leg.  Patient admits that occasionally she gets some sharp shooting pain along the shin but otherwise is doing okay.  Denies any reinjury since her original injury on March 31.  Patient Active Problem List   Diagnosis Date Noted  . H/O aortic aneurysm repair 03/17/2020  . Mixed dyslipidemia 03/17/2020  . BMI 40.0-44.9, adult (HCC) 10/13/2019  . 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 MDD (major depressive disorder), recurrent episode, severe (HCC) 03/16/2019  . Post concussion syndrome 03/16/2019  . Post traumatic stress disorder (PTSD) 03/16/2019  . Chronic low back pain 01/16/2019  . Hyponatremia 01/15/2019  . ANA positive 12/23/2018  . Bilateral dry eyes 12/23/2018  . Chronic nausea 12/23/2018  . Polyarthralgia 12/23/2018  . Bicuspid aortic valve 10/02/2017  . Recurrent major depressive disorder, in partial remission (HCC) 09/01/2017  . Thoracic aortic aneurysm without rupture (HCC) 09/01/2017  . Acute hemorrhagic colitis 07/16/2016  . Elevated white blood cell count, unspecified 07/16/2016  . TBI (traumatic brain injury) (HCC) 11/01/2015  . Cervical  dystonia 02/03/2013  . 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 Acne 10/14/2012  . Hypothyroidism 09/10/2011  . Oral aphthae 08/15/2011  . High risk medications (not anticoagulants) long-term use 05/08/2011  . 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 Healthcare maintenance 09/18/2009  . 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 DDD (degenerative disc disease), cervical 06/26/2009  . Fibromyalgia 06/26/2009  . Genital herpes 06/26/2009  . Migraine 06/26/2009  . States associated with artificial menopause 06/26/2009  . Unspecified osteoarthritis, unspecified site 06/26/2009  . Upper GI bleeding 06/26/2009    Current Outpatient Medications on File Prior to Visit  Medication Sig Dispense Refill  . acyclovir (ZOVIRAX) 400 MG tablet Take 400 mg by mouth 2 (two) times daily.    Marland Kitchen albuterol (ACCUNEB) 1.25 MG/3ML nebulizer solution Take 1 ampule by nebulization every 6 (six) hours as needed.    Marland Kitchen aspirin EC 81 MG tablet Take 1 tablet (81 mg total) by mouth daily. 90 tablet 3  . B Complex-C (B-COMPLEX WITH VITAMIN C) tablet Take 1 tablet by mouth daily.    Marland Kitchen esomeprazole (NEXIUM) 40 MG capsule Take 40 mg by mouth 2 (two) times daily.    Marland Kitchen estradiol (ESTRACE) 0.5 MG tablet Take 0.5 mg by mouth daily.    . furosemide (LASIX) 20 MG tablet Take 20 mg by mouth daily.    Marland Kitchen gabapentin (NEURONTIN) 300 MG capsule Take 300 mg by mouth 2 (two) times daily. Takes 300mg  in am and 600mg  in pm    .  hydrOXYzine (VISTARIL) 50 MG capsule Take 1 capsule by mouth 4 (four) times daily as needed for anxiety.    Marland Kitchen Ketorolac Tromethamine 15.75 MG/SPRAY SOLN Place 15.75 mg into the nose every 6 (six) hours as needed.    . lamoTRIgine (LAMICTAL) 200 MG tablet Take 200 mg by mouth 2 (two) times daily.    . Melatonin 5 MG CAPS Take 5 mg by mouth daily.    . methocarbamol (ROBAXIN) 750 MG tablet Take 750 mg by mouth in the morning, at noon, in the evening, and at bedtime.    . methylphenidate 36 MG PO CR  tablet Take 72 mg by mouth every morning.    . metoCLOPramide (REGLAN) 10 MG tablet Take 10 mg by mouth 4 (four) times daily as needed.    Marland Kitchen morphine (MS CONTIN) 15 MG 12 hr tablet Take 15 mg by mouth every 12 (twelve) hours.    Marland Kitchen morphine (MSIR) 15 MG tablet Take 1-2 tablets by mouth every 6 (six) hours as needed.    . Multiple Vitamins-Minerals (CENTRUM MEN) TABS Take 1 tablet by mouth daily.    . ondansetron (ZOFRAN-ODT) 8 MG disintegrating tablet Take 8 mg by mouth 3 (three) times daily.    . Oxcarbazepine (TRILEPTAL) 300 MG tablet Take 300 mg by mouth 2 (two) times daily.    . promethazine (PHENERGAN) 12.5 MG tablet Take 12.5 mg by mouth daily as needed.    . traZODone (DESYREL) 150 MG tablet Take 150 mg by mouth at bedtime.    . traZODone (DESYREL) 50 MG tablet Take 50 mg by mouth as needed for sleep.     No current facility-administered medications on file prior to visit.    Allergies  Allergen Reactions  . Celecoxib Rash    Acute generalized. exanthematous Acute generalized. exanthematous Acute generalized. exanthematous Acute generalized. exanthematous   . Hydrochlorothiazide Other (See Comments)  . Other Other (See Comments)    Other reaction(s): Other (See Comments) Vomiting/ GI bleed Other reaction(s): Other (See Comments) Vomiting/ GI bleed Vomiting/ GI bleed Other reaction(s): Other (See Comments) Vomiting/ GI bleed Vomiting/ GI bleed Vomiting/ GI bleed Other reaction(s): Other (See Comments) Vomiting/ GI bleed Vomiting/ GI bleed   . Cefpodoxime Other (See Comments)    Trouble breathing, fast/pounding heartbeat, nausea, blurry vision, slowed speech, stomach cramps, confusion, inability to lift head off pillow  . Cefuroxime Other (See Comments)    Tachycardia, dark urine, nausea, severe diarrhea, stomach pain and cramping, severe listlessness, weakness, slurred speech, confusion, slept 14 hours, unable to walk alone  . Cymbalta [Duloxetine Hcl] Other (See  Comments)    No appetite, inability to stay awake, nausea, severe itching, dark urine, extremelty weak, shaky, severe headache  . Fentanyl     "rapid heartbeat"  . Nsaids Other (See Comments)    Gastric bleeds  . Serotonin Reuptake Inhibitors (Ssris) Other (See Comments)    Nausea, dizziness, severe fatigue, anxiety, lethargy, HA, severe agitation, excessive sleeping  . Codeine Itching  . Duloxetine Itching, Nausea And Vomiting and Rash    Hard to wake up, couldn't walk Hard to wake up, couldn't walk. (AGEP) Hard to wake up, couldn't walk Hard to wake up, couldn't walk. (AGEP) Hard to wake up, couldn't walk Hard to wake up, couldn't walk. (AGEP) Hard to wake up, couldn't walk Hard to wake up, couldn't walk. (AGEP)     Objective:  General: Alert and oriented x3 in no acute distress  Dermatology: There is a small purple abrasion noted  to the anterior shin of the right leg.  No open lesions bilateral lower extremities, no webspace macerations, no ecchymosis bilateral, all nails x 10 are well manicured.  Vascular: Focal edema noted to right lower extremity. Dorsalis Pedis and Posterior Tibial pedal pulses 1/4, Capillary Fill Time 3 seconds,(+) scant pedal hair growth bilateral, Temperature gradient within normal limits.  Neurology: Michaell Cowing sensation intact via light touch bilateral.  Moderate  Musculoskeletal: There is moderate to severe tenderness with palpation at right leg along the tibia and there is also pain distally at the ankle diffusely, range of motion difficult to assess due to guarding due to pain on the right lower extremity.  There is no pain with palpations of the calf.  Gait: Wheelchair assisted   CT results reveal a mildly displaced and mildly comminuted diaphyseal fracture of the tibia.  No fracture indicated ankle.  Assessment and Plan: Problem List Items Addressed This Visit   None   Visit Diagnoses    Closed displaced comminuted fracture of shaft of right tibia  with routine healing    -  Primary   Right leg pain       Acute right ankle pain           -Complete examination performed -CT results reviewed -Discussed treatement options for fracture; risks, alternatives, and benefits explained. -Advised patient since this is a fracture of her tibia I would recommend for her to get a second opinion from Dr. Victorino Dike -At this time I recommend to continue with nonweightbearing rest ice elevation and may use the cam boot to protect her foot when she is in her wheelchair -Continue with chronic pain medications and patient does have a pain stimulator/modulator that is not currently working and is also scheduled to follow-up with her pain doctor/neurosurgeon in regards to this -Patient to follow-up as needed or sooner if problems or issues arise.  Asencion Islam, DPM

## 2021-04-20 NOTE — Patient Instructions (Signed)
Dr. Beulah Gandy. Victorino Dike, MD Orthopedic surgeon in [place], [facility] Address: [address], [place], [place] [address] Phone: [phone]

## 2021-06-12 ENCOUNTER — Other Ambulatory Visit (HOSPITAL_COMMUNITY): Payer: Self-pay | Admitting: Orthopedic Surgery

## 2021-06-20 ENCOUNTER — Other Ambulatory Visit: Payer: Self-pay

## 2021-06-20 ENCOUNTER — Encounter (HOSPITAL_BASED_OUTPATIENT_CLINIC_OR_DEPARTMENT_OTHER): Payer: Self-pay | Admitting: Orthopedic Surgery

## 2021-06-30 MED ORDER — FENTANYL CITRATE (PF) 100 MCG/2ML IJ SOLN
INTRAMUSCULAR | Status: AC
  Filled 2021-06-30: qty 2

## 2021-06-30 MED ORDER — OXYTOCIN-SODIUM CHLORIDE 30-0.9 UT/500ML-% IV SOLN
INTRAVENOUS | Status: AC
  Filled 2021-06-30: qty 500

## 2021-06-30 MED ORDER — DEXAMETHASONE SODIUM PHOSPHATE 10 MG/ML IJ SOLN
INTRAMUSCULAR | Status: AC
  Filled 2021-06-30: qty 1

## 2021-06-30 MED ORDER — PHENYLEPHRINE HCL-NACL 20-0.9 MG/250ML-% IV SOLN
INTRAVENOUS | Status: AC
  Filled 2021-06-30: qty 250

## 2021-06-30 MED ORDER — MORPHINE SULFATE (PF) 0.5 MG/ML IJ SOLN
INTRAMUSCULAR | Status: AC
  Filled 2021-06-30: qty 10

## 2021-06-30 MED ORDER — ONDANSETRON HCL 4 MG/2ML IJ SOLN
INTRAMUSCULAR | Status: AC
  Filled 2021-06-30: qty 4

## 2021-07-13 ENCOUNTER — Encounter (HOSPITAL_BASED_OUTPATIENT_CLINIC_OR_DEPARTMENT_OTHER)
Admission: RE | Admit: 2021-07-13 | Discharge: 2021-07-13 | Disposition: A | Discharge status: Home / Self Care | Payer: 59 | Source: Ambulatory Visit | Attending: Orthopedic Surgery | Admitting: Orthopedic Surgery

## 2021-07-13 ENCOUNTER — Encounter (HOSPITAL_BASED_OUTPATIENT_CLINIC_OR_DEPARTMENT_OTHER): Payer: Self-pay | Admitting: Orthopedic Surgery

## 2021-07-13 DIAGNOSIS — Z01812 Encounter for preprocedural laboratory examination: Secondary | ICD-10-CM | POA: Insufficient documentation

## 2021-07-13 LAB — BASIC METABOLIC PANEL
Anion gap: 7 (ref 5–15)
BUN: 8 mg/dL (ref 6–20)
CO2: 26 mmol/L (ref 22–32)
Calcium: 9.4 mg/dL (ref 8.9–10.3)
Chloride: 97 mmol/L — ABNORMAL LOW (ref 98–111)
Creatinine, Ser: 0.76 mg/dL (ref 0.44–1.00)
GFR, Estimated: >60 mL/min (ref >60)
Glucose, Bld: 111 mg/dL — ABNORMAL HIGH (ref 70–99)
Potassium: 4.7 mmol/L (ref 3.5–5.1)
Sodium: 130 mmol/L — ABNORMAL LOW (ref 135–145)

## 2021-07-13 NOTE — Progress Notes (Signed)

## 2021-07-19 ENCOUNTER — Encounter (HOSPITAL_BASED_OUTPATIENT_CLINIC_OR_DEPARTMENT_OTHER)
Admission: RE | Disposition: A | Discharge status: Home / Self Care | Payer: Self-pay | Source: Home / Self Care | Attending: Orthopedic Surgery

## 2021-07-19 ENCOUNTER — Ambulatory Visit (HOSPITAL_BASED_OUTPATIENT_CLINIC_OR_DEPARTMENT_OTHER): Payer: 59 | Admitting: Certified Registered"

## 2021-07-19 ENCOUNTER — Ambulatory Visit (HOSPITAL_COMMUNITY): Payer: 59

## 2021-07-19 ENCOUNTER — Ambulatory Visit (HOSPITAL_BASED_OUTPATIENT_CLINIC_OR_DEPARTMENT_OTHER)
Admission: RE | Admit: 2021-07-19 | Discharge: 2021-07-19 | Disposition: A | Discharge status: Home / Self Care | Payer: 59 | Attending: Orthopedic Surgery | Admitting: Orthopedic Surgery

## 2021-07-19 ENCOUNTER — Encounter (HOSPITAL_BASED_OUTPATIENT_CLINIC_OR_DEPARTMENT_OTHER): Payer: Self-pay | Admitting: Orthopedic Surgery

## 2021-07-19 ENCOUNTER — Other Ambulatory Visit: Payer: Self-pay

## 2021-07-19 DIAGNOSIS — Z885 Allergy status to narcotic agent status: Secondary | ICD-10-CM | POA: Diagnosis not present

## 2021-07-19 DIAGNOSIS — Z6841 Body Mass Index (BMI) 40.0 and over, adult: Secondary | ICD-10-CM | POA: Insufficient documentation

## 2021-07-19 DIAGNOSIS — Z823 Family history of stroke: Secondary | ICD-10-CM | POA: Diagnosis not present

## 2021-07-19 DIAGNOSIS — S82301K Unspecified fracture of lower end of right tibia, subsequent encounter for closed fracture with nonunion: Secondary | ICD-10-CM | POA: Diagnosis not present

## 2021-07-19 DIAGNOSIS — Z79899 Other long term (current) drug therapy: Secondary | ICD-10-CM | POA: Diagnosis not present

## 2021-07-19 DIAGNOSIS — Z8249 Family history of ischemic heart disease and other diseases of the circulatory system: Secondary | ICD-10-CM | POA: Insufficient documentation

## 2021-07-19 DIAGNOSIS — Z8261 Family history of arthritis: Secondary | ICD-10-CM | POA: Insufficient documentation

## 2021-07-19 DIAGNOSIS — Z800 Family history of malignant neoplasm of digestive organs: Secondary | ICD-10-CM | POA: Insufficient documentation

## 2021-07-19 DIAGNOSIS — X58XXXD Exposure to other specified factors, subsequent encounter: Secondary | ICD-10-CM | POA: Insufficient documentation

## 2021-07-19 DIAGNOSIS — Z888 Allergy status to other drugs, medicaments and biological substances status: Secondary | ICD-10-CM | POA: Insufficient documentation

## 2021-07-19 DIAGNOSIS — Z886 Allergy status to analgesic agent status: Secondary | ICD-10-CM | POA: Insufficient documentation

## 2021-07-19 DIAGNOSIS — Z881 Allergy status to other antibiotic agents status: Secondary | ICD-10-CM | POA: Diagnosis not present

## 2021-07-19 DIAGNOSIS — Z419 Encounter for procedure for purposes other than remedying health state, unspecified: Secondary | ICD-10-CM

## 2021-07-19 DIAGNOSIS — Z86718 Personal history of other venous thrombosis and embolism: Secondary | ICD-10-CM | POA: Insufficient documentation

## 2021-07-19 HISTORY — DX: Other specified postprocedural states: Z98.890

## 2021-07-19 HISTORY — DX: Other complications of anesthesia, initial encounter: T88.59XA

## 2021-07-19 HISTORY — PX: TIBIA IM NAIL INSERTION: SHX2516

## 2021-07-19 HISTORY — DX: Nausea with vomiting, unspecified: R11.2

## 2021-07-19 SURGERY — INSERTION, INTRAMEDULLARY ROD, TIBIA
Anesthesia: General | Site: Leg Lower | Laterality: Right

## 2021-07-19 MED ORDER — FENTANYL CITRATE (PF) 100 MCG/2ML IJ SOLN
100.0000 ug | Freq: Once | INTRAMUSCULAR | Status: AC
  Administered 2021-07-19: 100 ug via INTRAVENOUS

## 2021-07-19 MED ORDER — PROMETHAZINE HCL 25 MG/ML IJ SOLN
6.2500 mg | INTRAMUSCULAR | Status: DC | PRN
  Administered 2021-07-19: 6.25 mg via INTRAVENOUS

## 2021-07-19 MED ORDER — MIDAZOLAM HCL 2 MG/2ML IJ SOLN
INTRAMUSCULAR | Status: AC
  Filled 2021-07-19: qty 2

## 2021-07-19 MED ORDER — HYDROMORPHONE HCL 1 MG/ML IJ SOLN
INTRAMUSCULAR | Status: AC
  Filled 2021-07-19: qty 0.5

## 2021-07-19 MED ORDER — FENTANYL CITRATE (PF) 100 MCG/2ML IJ SOLN
INTRAMUSCULAR | Status: AC
  Filled 2021-07-19: qty 2

## 2021-07-19 MED ORDER — SODIUM CHLORIDE 0.9 % IV SOLN: INTRAVENOUS | Status: DC

## 2021-07-19 MED ORDER — LIDOCAINE HCL (CARDIAC) PF 100 MG/5ML IV SOSY
PREFILLED_SYRINGE | INTRAVENOUS | Status: DC | PRN
  Administered 2021-07-19: 30 mg via INTRAVENOUS

## 2021-07-19 MED ORDER — PROPOFOL 10 MG/ML IV BOLUS
INTRAVENOUS | Status: DC | PRN
  Administered 2021-07-19: 200 mg via INTRAVENOUS

## 2021-07-19 MED ORDER — OXYCODONE HCL 5 MG/5ML PO SOLN: 5.0000 mg | Freq: Once | ORAL | Status: DC | PRN

## 2021-07-19 MED ORDER — PROPOFOL 500 MG/50ML IV EMUL
INTRAVENOUS | Status: AC
  Filled 2021-07-19: qty 50

## 2021-07-19 MED ORDER — MEPERIDINE HCL 25 MG/ML IJ SOLN: 6.2500 mg | INTRAMUSCULAR | Status: DC | PRN

## 2021-07-19 MED ORDER — ROPIVACAINE HCL 5 MG/ML IJ SOLN
INTRAMUSCULAR | Status: DC | PRN
  Administered 2021-07-19: 30 mL via PERINEURAL

## 2021-07-19 MED ORDER — SENNA 8.6 MG PO TABS: 2.0000 | ORAL_TABLET | Freq: Two times a day (BID) | ORAL | 0 refills | Status: DC

## 2021-07-19 MED ORDER — OXYCODONE HCL 5 MG PO TABS: 5.0000 mg | ORAL_TABLET | ORAL | 0 refills | Status: AC | PRN

## 2021-07-19 MED ORDER — RIVAROXABAN 10 MG PO TABS: 10.0000 mg | ORAL_TABLET | Freq: Every day | ORAL | 0 refills | Status: DC

## 2021-07-19 MED ORDER — MIDAZOLAM HCL 2 MG/2ML IJ SOLN
1.0000 mg | Freq: Once | INTRAMUSCULAR | Status: AC
  Administered 2021-07-19: 1 mg via INTRAVENOUS

## 2021-07-19 MED ORDER — ONDANSETRON HCL 4 MG/2ML IJ SOLN
INTRAMUSCULAR | Status: DC | PRN
  Administered 2021-07-19: 4 mg via INTRAVENOUS

## 2021-07-19 MED ORDER — OXYCODONE HCL 5 MG PO TABS
ORAL_TABLET | ORAL | Status: AC
  Filled 2021-07-19: qty 2

## 2021-07-19 MED ORDER — HYDROMORPHONE HCL 1 MG/ML IJ SOLN: 0.5000 mg | INTRAMUSCULAR | Status: DC | PRN

## 2021-07-19 MED ORDER — OXYCODONE HCL 5 MG PO TABS
10.0000 mg | ORAL_TABLET | Freq: Once | ORAL | Status: AC
  Administered 2021-07-19: 10 mg via ORAL

## 2021-07-19 MED ORDER — HYDROMORPHONE HCL 1 MG/ML IJ SOLN
0.2500 mg | INTRAMUSCULAR | Status: DC | PRN
  Administered 2021-07-19 (×6): 0.5 mg via INTRAVENOUS

## 2021-07-19 MED ORDER — DEXMEDETOMIDINE (PRECEDEX) IN NS 20 MCG/5ML (4 MCG/ML) IV SYRINGE
PREFILLED_SYRINGE | INTRAVENOUS | Status: DC | PRN
  Administered 2021-07-19: 12 ug via INTRAVENOUS

## 2021-07-19 MED ORDER — 0.9 % SODIUM CHLORIDE (POUR BTL) OPTIME
TOPICAL | Status: DC | PRN
  Administered 2021-07-19: 200 mL

## 2021-07-19 MED ORDER — DEXAMETHASONE SODIUM PHOSPHATE 10 MG/ML IJ SOLN
INTRAMUSCULAR | Status: DC | PRN
  Administered 2021-07-19: 4 mg via INTRAVENOUS

## 2021-07-19 MED ORDER — DOCUSATE SODIUM 100 MG PO CAPS: 100.0000 mg | ORAL_CAPSULE | Freq: Two times a day (BID) | ORAL | 0 refills | Status: DC

## 2021-07-19 MED ORDER — PROMETHAZINE HCL 25 MG/ML IJ SOLN
INTRAMUSCULAR | Status: AC
  Filled 2021-07-19: qty 1

## 2021-07-19 MED ORDER — PROPOFOL 500 MG/50ML IV EMUL
INTRAVENOUS | Status: DC | PRN
  Administered 2021-07-19: 35 ug/kg/min via INTRAVENOUS

## 2021-07-19 MED ORDER — OXYCODONE HCL 5 MG PO TABS: 5.0000 mg | ORAL_TABLET | Freq: Once | ORAL | Status: DC | PRN

## 2021-07-19 MED ORDER — VANCOMYCIN HCL 500 MG IV SOLR
INTRAVENOUS | Status: DC | PRN
  Administered 2021-07-19: 500 mg via TOPICAL

## 2021-07-19 MED ORDER — HYDROMORPHONE HCL 1 MG/ML IJ SOLN: 0.5000 mg | INTRAMUSCULAR | Status: DC

## 2021-07-19 MED ORDER — FENTANYL CITRATE (PF) 100 MCG/2ML IJ SOLN
INTRAMUSCULAR | Status: DC | PRN
  Administered 2021-07-19 (×4): 25 ug via INTRAVENOUS
  Administered 2021-07-19 (×2): 50 ug via INTRAVENOUS

## 2021-07-19 MED ORDER — MIDAZOLAM HCL 2 MG/2ML IJ SOLN
2.0000 mg | Freq: Once | INTRAMUSCULAR | Status: AC
  Administered 2021-07-19: 2 mg via INTRAVENOUS

## 2021-07-19 MED ORDER — LACTATED RINGERS IV SOLN: INTRAVENOUS | Status: DC

## 2021-07-19 SURGICAL SUPPLY — 77 items
APL PRP STRL LF DISP 70% ISPRP (MISCELLANEOUS) ×1
BANDAGE ESMARK 6X9 LF (GAUZE/BANDAGES/DRESSINGS) IMPLANT
BIT DRILL 3.8X6 NS (BIT) ×2 IMPLANT
BIT DRILL 4.4 NS (BIT) ×2 IMPLANT
BLADE SURG 15 STRL LF DISP TIS (BLADE) ×2 IMPLANT
BLADE SURG 15 STRL SS (BLADE) ×4
BNDG CMPR 9X6 STRL LF SNTH (GAUZE/BANDAGES/DRESSINGS)
BNDG COHESIVE 4X5 TAN ST LF (GAUZE/BANDAGES/DRESSINGS) ×2 IMPLANT
BNDG COHESIVE 6X5 TAN ST LF (GAUZE/BANDAGES/DRESSINGS) IMPLANT
BNDG ELASTIC 4X5.8 VLCR STR LF (GAUZE/BANDAGES/DRESSINGS) IMPLANT
BNDG ELASTIC 6X5.8 VLCR STR LF (GAUZE/BANDAGES/DRESSINGS) ×2 IMPLANT
BNDG ESMARK 6X9 LF (GAUZE/BANDAGES/DRESSINGS)
CANISTER SUCT 1200ML W/VALVE (MISCELLANEOUS) ×2 IMPLANT
CHLORAPREP W/TINT 26 (MISCELLANEOUS) ×2 IMPLANT
COVER BACK TABLE 60X90IN (DRAPES) ×4 IMPLANT
COVER MAYO STAND STRL (DRAPES) ×2 IMPLANT
CUFF TOURN SGL QUICK 34 (TOURNIQUET CUFF)
CUFF TOURN SGL QUICK 44 (TOURNIQUET CUFF) ×2 IMPLANT
CUFF TRNQT CYL 34X4.125X (TOURNIQUET CUFF) IMPLANT
DRAPE C-ARM 42X72 X-RAY (DRAPES) ×2 IMPLANT
DRAPE C-ARMOR (DRAPES) ×2 IMPLANT
DRAPE EXTREMITY T 121X128X90 (DISPOSABLE) ×2 IMPLANT
DRAPE U-SHAPE 47X51 STRL (DRAPES) ×2 IMPLANT
DRSG MEPILEX BORDER 4X8 (GAUZE/BANDAGES/DRESSINGS) ×2 IMPLANT
DRSG MEPITEL 4X7.2 (GAUZE/BANDAGES/DRESSINGS) ×2 IMPLANT
DRSG PAD ABDOMINAL 8X10 ST (GAUZE/BANDAGES/DRESSINGS) ×6 IMPLANT
ELECT REM PT RETURN 9FT ADLT (ELECTROSURGICAL) ×2
ELECTRODE REM PT RTRN 9FT ADLT (ELECTROSURGICAL) ×1 IMPLANT
GAUZE SPONGE 4X4 12PLY STRL (GAUZE/BANDAGES/DRESSINGS) ×2 IMPLANT
GLOVE SRG 8 PF TXTR STRL LF DI (GLOVE) ×2 IMPLANT
GLOVE SURG ENC MOIS LTX SZ8 (GLOVE) ×2 IMPLANT
GLOVE SURG LTX SZ8 (GLOVE) ×2 IMPLANT
GLOVE SURG POLYISO LF SZ7 (GLOVE) ×2 IMPLANT
GLOVE SURG UNDER POLY LF SZ7 (GLOVE) ×4 IMPLANT
GLOVE SURG UNDER POLY LF SZ8 (GLOVE) ×4
GOWN STRL REUS W/ TWL LRG LVL3 (GOWN DISPOSABLE) ×1 IMPLANT
GOWN STRL REUS W/ TWL XL LVL3 (GOWN DISPOSABLE) ×2 IMPLANT
GOWN STRL REUS W/TWL LRG LVL3 (GOWN DISPOSABLE) ×2
GOWN STRL REUS W/TWL XL LVL3 (GOWN DISPOSABLE) ×4
GUIDEPIN 3.2X17.5 THRD DISP (PIN) ×2 IMPLANT
GUIDEWIRE BALL NOSE 80CM (WIRE) ×2 IMPLANT
NAIL TIBIAL 10MMX31.5CM (Nail) ×2 IMPLANT
NEEDLE HYPO 22GX1.5 SAFETY (NEEDLE) IMPLANT
NS IRRIG 1000ML POUR BTL (IV SOLUTION) ×2 IMPLANT
PACK BASIN DAY SURGERY FS (CUSTOM PROCEDURE TRAY) ×2 IMPLANT
PAD CAST 4YDX4 CTTN HI CHSV (CAST SUPPLIES) ×1 IMPLANT
PADDING CAST COTTON 4X4 STRL (CAST SUPPLIES) ×2
PADDING CAST COTTON 6X4 STRL (CAST SUPPLIES) ×2 IMPLANT
PENCIL SMOKE EVACUATOR (MISCELLANEOUS) ×2 IMPLANT
SANITIZER HAND PURELL 535ML FO (MISCELLANEOUS) ×2 IMPLANT
SCREW ACECAP 38MM (Screw) ×2 IMPLANT
SCREW ACECAP 46MM (Screw) ×2 IMPLANT
SCREW PROXIMAL DEPUY (Screw) ×4 IMPLANT
SCREW PRXML FT 45X5.5XLCK NS (Screw) ×1 IMPLANT
SCREW PRXML FT 55X5.5XNS TIB (Screw) ×1 IMPLANT
SHEET MEDIUM DRAPE 40X70 STRL (DRAPES) ×2 IMPLANT
SLEEVE SCD COMPRESS KNEE MED (STOCKING) ×2 IMPLANT
SPLINT FAST PLASTER 5X30 (CAST SUPPLIES)
SPLINT PLASTER CAST FAST 5X30 (CAST SUPPLIES) IMPLANT
SPONGE T-LAP 18X18 ~~LOC~~+RFID (SPONGE) ×2 IMPLANT
STOCKINETTE 6  STRL (DRAPES) ×1
STOCKINETTE 6 STRL (DRAPES) ×1 IMPLANT
STRIP CLOSURE SKIN 1/2X4 (GAUZE/BANDAGES/DRESSINGS) IMPLANT
SUCTION FRAZIER HANDLE 10FR (MISCELLANEOUS) ×1
SUCTION TUBE FRAZIER 10FR DISP (MISCELLANEOUS) ×1 IMPLANT
SUT ETHILON 2 0 FS 18 (SUTURE) IMPLANT
SUT ETHILON 3 0 PS 1 (SUTURE) ×4 IMPLANT
SUT MNCRL AB 3-0 PS2 18 (SUTURE) ×2 IMPLANT
SUT VIC AB 2-0 SH 27 (SUTURE)
SUT VIC AB 2-0 SH 27XBRD (SUTURE) IMPLANT
SUT VICRYL 0 SH 27 (SUTURE) ×4 IMPLANT
SYR BULB EAR ULCER 3OZ GRN STR (SYRINGE) ×2 IMPLANT
SYR CONTROL 10ML LL (SYRINGE) IMPLANT
TOWEL GREEN STERILE FF (TOWEL DISPOSABLE) ×4 IMPLANT
TUBE CONNECTING 20X1/4 (TUBING) ×2 IMPLANT
UNDERPAD 30X36 HEAVY ABSORB (UNDERPADS AND DIAPERS) ×2 IMPLANT
YANKAUER SUCT BULB TIP NO VENT (SUCTIONS) ×2 IMPLANT

## 2021-07-19 NOTE — Addendum Note (Signed)
Addendum  created 07/19/21 1337 by Kaylyn Layer, MD   Order list changed, Pharmacy for encounter modified

## 2021-07-19 NOTE — Transfer of Care (Signed)
Immediate Anesthesia Transfer of Care Note  Patient: Audrey Cannon  Procedure(s) Performed: 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 (Right: Leg Lower)  Patient Location: PACU  Anesthesia Type:GA combined with regional for post-op pain  Level of Consciousness: drowsy and patient cooperative  Airway & Oxygen Therapy: Patient Spontanous Breathing and Patient connected to face mask oxygen  Post-op Assessment: Report given to RN and Post -op Vital signs reviewed and stable  Post vital signs: Reviewed and stable  Last Vitals:  Vitals Value Taken Time  BP    Temp    Pulse 73 07/19/21 1109  Resp 21 07/19/21 1109  SpO2 100 % 07/19/21 1109  Vitals shown include unvalidated device data.  Last Pain:  Vitals:   07/19/21 0756  TempSrc: Oral  PainSc: 5       Patients Stated Pain Goal: 5 (07/19/21 0756)  Complications: No notable events documented.

## 2021-07-19 NOTE — Anesthesia Procedure Notes (Signed)
Anesthesia Regional Block: Popliteal block   Pre-Anesthetic Checklist: , timeout performed,  Correct Patient, Correct Site, Correct Laterality,  Correct Procedure, Correct Position, site marked,  Risks and benefits discussed,  Surgical consent,  Pre-op evaluation,  At surgeon's request and post-op pain management  Laterality: Right  Prep: chloraprep       Needles:  Injection technique: Single-shot  Needle Type: Stimiplex     Needle Length: 9cm  Needle Gauge: 21     Additional Needles:   Procedures:,,,, ultrasound used (permanent image in chart),,    Narrative:  Start time: 07/19/2021 8:25 AM End time: 07/19/2021 8:30 AM Injection made incrementally with aspirations every 5 mL.  Performed by: Personally  Anesthesiologist: Lowella Curb, MD

## 2021-07-19 NOTE — Discharge Instructions (Addendum)
Toni Arthurs, MD [facility]  Please read the following information regarding your care after surgery.  Medications  You only need a prescription for the narcotic pain medicine (ex. oxycodone, Percocet, Norco).  All of the other medicines listed below are available over the counter. X acetominophen (Tylenol) 650 mg every 4-6 hours as you need for minor to moderate pain X oxycodone as prescribed for severe pain.  May supplement with your morphine as needed.  Narcotic pain medicine (ex. oxycodone, Percocet, Vicodin) will cause constipation.  To prevent this problem, take the following medicines while you are taking any pain medicine. X docusate sodium (Colace) 100 mg twice a day X senna (Senokot) 2 tablets twice a day  X To help prevent blood clots, take Xarelto as prescribed for two weeks after surgery.  You should also get up every hour while you are awake to move around.    Weight Bearing X Do not bear any weight on the operated leg or foot.  Cast / Splint / Dressing X Keep your splint, cast or dressing clean and dry.  Don't put anything (coat hanger, pencil, etc) down inside of it.  If it gets damp, use a hair dryer on the cool setting to dry it.  If it gets soaked, call the office to schedule an appointment for a cast change.   After your dressing, cast or splint is removed; you may shower, but do not soak or scrub the wound.  Allow the water to run over it, and then gently pat it dry.  Swelling It is normal for you to have swelling where you had surgery.  To reduce swelling and pain, keep your toes above your nose for at least 3 days after surgery.  It may be necessary to keep your foot or leg elevated for several weeks.  If it hurts, it should be elevated.  Follow Up Call my office at [phone] when you are discharged from the hospital or surgery center to schedule an appointment to be seen two weeks after surgery.  Call my office at [phone] if you develop a fever >101.5 F,  nausea, vomiting, bleeding from the surgical site or severe pain.     Post Anesthesia Home Care Instructions  Activity: Get plenty of rest for the remainder of the day. A responsible individual must stay with you for 24 hours following the procedure.  For the next 24 hours, DO NOT: -Drive a car -Advertising copywriter -Drink alcoholic beverages -Take any medication unless instructed by your physician -Make any legal decisions or sign important papers.  Meals: Start with liquid foods such as gelatin or soup. Progress to regular foods as tolerated. Avoid greasy, spicy, heavy foods. If nausea and/or vomiting occur, drink only clear liquids until the nausea and/or vomiting subsides. Call your physician if vomiting continues.  Special Instructions/Symptoms: Your throat may feel dry or sore from the anesthesia or the breathing tube placed in your throat during surgery. If this causes discomfort, gargle with warm salt water. The discomfort should disappear within 24 hours.  If you had a scopolamine patch placed behind your ear for the management of post- operative nausea and/or vomiting:  1. The medication in the patch is effective for 72 hours, after which it should be removed.  Wrap patch in a tissue and discard in the trash. Wash hands thoroughly with soap and water. 2. You may remove the patch earlier than 72 hours if you experience unpleasant side effects which may include dry mouth, dizziness or visual  disturbances. 3. Avoid touching the patch. Wash your hands with soap and water after contact with the patch.    Regional Anesthesia Blocks  1. Numbness or the inability to move the "blocked" extremity may last from 3-48 hours after placement. The length of time depends on the medication injected and your individual response to the medication. If the numbness is not going away after 48 hours, call your surgeon.  2. The extremity that is blocked will need to be protected until the numbness is gone  and the  Strength has returned. Because you cannot feel it, you will need to take extra care to avoid injury. Because it may be weak, you may have difficulty moving it or using it. You may not know what position it is in without looking at it while the block is in effect.  3. For blocks in the legs and feet, returning to weight bearing and walking needs to be done carefully. You will need to wait until the numbness is entirely gone and the strength has returned. You should be able to move your leg and foot normally before you try and bear weight or walk. You will need someone to be with you when you first try to ensure you do not fall and possibly risk injury.  4. Bruising and tenderness at the needle site are common side effects and will resolve in a few days.  5. Persistent numbness or new problems with movement should be communicated to the surgeon or the [facility] [phone] [facility] [phone]).

## 2021-07-19 NOTE — Progress Notes (Signed)
Assisted Dr. Miller with right, ultrasound guided, popliteal block. Side rails up, monitors on throughout procedure. See vital signs in flow sheet. Tolerated Procedure well. 

## 2021-07-19 NOTE — Anesthesia Procedure Notes (Signed)
Procedure Name: LMA Insertion Date/Time: 07/19/2021 9:24 AM Performed by: Sheryn Bison, CRNA Pre-anesthesia Checklist: Patient identified, Emergency Drugs available, Suction available and Patient being monitored Patient Re-evaluated:Patient Re-evaluated prior to induction Oxygen Delivery Method: Circle System Utilized Preoxygenation: Pre-oxygenation with 100% oxygen Induction Type: IV induction Ventilation: Mask ventilation without difficulty LMA: LMA inserted LMA Size: 4.0 Number of attempts: 1 Airway Equipment and Method: bite block Placement Confirmation: positive ETCO2 Tube secured with: Tape Dental Injury: Teeth and Oropharynx as per pre-operative assessment

## 2021-07-19 NOTE — Anesthesia Preprocedure Evaluation (Signed)
Anesthesia Evaluation  Patient identified by MRN, date of birth, ID band Patient awake    Reviewed: Allergy & Precautions, NPO status , Patient's Chart, lab work & pertinent test results  History of Anesthesia Complications (+) PONV  Airway Mallampati: II  TM Distance: >3 FB Neck ROM: Full    Dental no notable dental hx.    Pulmonary neg pulmonary ROS,    Pulmonary exam normal breath sounds clear to auscultation       Cardiovascular negative cardio ROS Normal cardiovascular exam Rhythm:Regular Rate:Normal     Neuro/Psych  Headaches, Anxiety Depression Bipolar Disorder Dementia  Neuromuscular disease negative psych ROS   GI/Hepatic negative GI ROS, Neg liver ROS,   Endo/Other  Hypothyroidism Morbid obesity  Renal/GU negative Renal ROS  negative genitourinary   Musculoskeletal  (+) Arthritis , Osteoarthritis,  Fibromyalgia -  Abdominal (+) + obese,   Peds negative pediatric ROS (+)  Hematology negative hematology ROS (+)   Anesthesia Other Findings   Reproductive/Obstetrics negative OB ROS                             Anesthesia Physical Anesthesia Plan  ASA: 3  Anesthesia Plan: General   Post-op Pain Management:  Regional for Post-op pain   Induction: Intravenous  PONV Risk Score and Plan: 4 or greater and Ondansetron, Dexamethasone, Midazolam and Droperidol  Airway Management Planned: LMA  Additional Equipment:   Intra-op Plan:   Post-operative Plan: Extubation in OR  Informed Consent: I have reviewed the patients History and Physical, chart, labs and discussed the procedure including the risks, benefits and alternatives for the proposed anesthesia with the patient or authorized representative who has indicated his/her understanding and acceptance.     Dental advisory given  Plan Discussed with: CRNA  Anesthesia Plan Comments:         Anesthesia Quick  Evaluation

## 2021-07-19 NOTE — Op Note (Signed)
07/19/2021  11:09 AM  PATIENT:  Audrey Cannon  55 y.o. female  PRE-OPERATIVE DIAGNOSIS:  Right tibial shaft fracture nonunion  POST-OPERATIVE DIAGNOSIS:  Right tibial shaft fracture nonunion  Procedure(s):  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  SURGEON:  Toni Arthurs, MD  ASSISTANT: Alfredo Martinez, PA-C  ANESTHESIA:   General, regional  EBL:  minimal   TOURNIQUET:   Total Tourniquet Time Documented: Thigh (Left) - 8 minutes Total: Thigh (Left) - 8 minutes  COMPLICATIONS:  None apparent  DISPOSITION:  Extubated, awake and stable to recovery.  INDICATION FOR PROCEDURE: The patient is a 54 year old female with a complicated past medical history.  She fractured her left tibia and has developed a nonunion after attempted closed treatment.  She presents now for operative treatment of this painful left tibia fracture nonunion.The risks and benefits of the alternative treatment options have been discussed in detail.  The patient wishes to proceed with surgery and specifically understands risks of bleeding, infection, nerve damage, blood clots, need for additional surgery, amputation and death.   PROCEDURE IN DETAIL:  After pre operative consent was obtained, and the correct operative site was identified, the patient was brought to the operating room and placed supine on the OR table.  Anesthesia was administered.  Pre-operative antibiotics were administered.  A surgical timeout was taken.  The left lower extremity was prepped and draped in standard sterile fashion with a tourniquet around the thigh.  The extremity was elevated and the tourniquet was inflated to 350 mmHg.  A lateral parapatellar incision was made at the knee.  Dissection was carried sharply down through the subcutaneous tissue.  The retinaculum was incised leaving a cuff of tissue off the lateral aspect of the patella.  Dissection was carried down between the patellar tendon and fat pad.   The tourniquet was released.  A guidepin was inserted in line with the medullary canal under fluoroscopic guidance.  An awl was then used to open the medullary canal.  A ball-tipped guidewire was inserted in the medullary canal and advanced to the fracture site.  The fracture was reduced and the guidewire passed across to the physeal scar of the distal tibia.  The guidewire was then sequentially reamed to a diameter of 11 mm.  A 10 mm x 31.5 cm [employer] versa nail was inserted.  It was seated at the level of the physeal scar distally.  Proximally the targeting guide was used to insert 2 oblique interlock screws.  Distally the perfect circle technique was used to insert 2 interlocking screws from medial to lateral.  AP and lateral radiographs at the knee, ankle and fracture site showed appropriate position and length of all hardware and appropriate reduction of the fracture.  The wounds were irrigated copiously and sprinkled with vancomycin powder.  The skin incisions were closed with nylon.  The retinaculum was closed with figure-of-eight sutures of 0 Vicryl.  Subcutaneous tissues were approximated with Monocryl.  The skin incision was closed with horizontal mattress sutures of 3-0 nylon.  Sterile dressings were applied followed by well-padded short leg splint and a compression wrap around the knee.   FOLLOW UP PLAN: Nonweightbearing on the right lower extremity.  Follow-up in the office in 2 weeks for suture removal and conversion to a cam boot to allow partial weightbearing.  Xarelto for DVT prophylaxis due to her history of obesity.    Alfredo Martinez PA-C was present and scrubbed for the duration of the operative case.  His assistance was essential in positioning the patient, prepping and draping, gaining and maintaining exposure, performing the operation, closing and dressing the wounds and applying the splint.

## 2021-07-19 NOTE — Anesthesia Postprocedure Evaluation (Signed)
Anesthesia Post Note  Patient: Audrey Cannon  Procedure(s) Performed: 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 (Right: Leg Lower)     Patient location during evaluation: PACU Anesthesia Type: General Level of consciousness: awake and alert and oriented Pain management: pain level controlled Vital Signs Assessment: post-procedure vital signs reviewed and stable Respiratory status: spontaneous breathing, nonlabored ventilation and respiratory function stable Cardiovascular status: blood pressure returned to baseline Postop Assessment: no apparent nausea or vomiting Anesthetic complications: no   No notable events documented.  Last Vitals:  Vitals:   07/19/21 1200 07/19/21 1215  BP: (!) 126/107 (!) 143/109  Pulse: 77 77  Resp: 16 15  Temp:    SpO2: 98% 99%    Last Pain:  Vitals:   07/19/21 1215  TempSrc:   PainSc: 8                  Shanda Howells

## 2021-07-19 NOTE — H&P (Signed)
Audrey Cannon is an 55 y.o. female.   Chief Complaint: Right tibia fracture HPI: 55 year old female with a complicated past medical history presents today for treatment of her tibial shaft nonunion.  She has failed nonoperative treatment to date including nonweightbearing immobilization.  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 DAVID DOMINGUEZ, NP [phone]) IN [facility] of this note might be different from the original. 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 C5-6 fusion 10/2010 [facility];(hx [facility] BOTOX INJECTIONS, CHIROPRACTER, MASSAGE [place]) Formatting of this note might be different from the original. C5-6 fusion 10/2010 [facility];(hx [place] [facility] BOTOX INJECTIONS, CHIROPRACTER, MASSAGE [place])   Elevated white blood cell count, unspecified 07/16/2016   Fibromyalgia 06/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 colonoscopy 2007 negative, repeat Nov 2011 negative (fam hx, repeat 2016)   High risk medications (not anticoagulants) long-term use  05/08/2011   Formatting of this note might be different from the original. [facility] (contract scanned into [employer]);#120 Oxycodone/month;Most recent UDS: 04/2011 (appropriate) Formatting of this note might be different from the original. [facility] Medicine (contract scanned into [employer]);#120 Oxycodone/month;Most recent UDS: 04/2011 (appropriate)   Hyponatremia 01/15/2019   Hypothyroidism 09/10/2011   MDD (major depressive disorder), recurrent episode, severe (HCC) 03/16/2019   Migraine 06/26/2009   Oral aphthae 08/15/2011   Polyarthralgia 12/23/2018   PONV (postoperative nausea and vomiting)    Post concussion syndrome 03/16/2019   Post traumatic stress disorder (PTSD) 03/16/2019   Recurrent major depressive disorder, in partial remission (HCC) 09/01/2017   States associated with artificial menopause 06/26/2009   TBI (traumatic brain injury) (HCC) 11/01/2015   Thoracic aortic aneurysm without rupture (HCC) 09/01/2017   Formatting of this note might be different from the original. H/o repair 2/15 outside records indicate moderate aortic regurgitation and aneurysm measuring 4.6 cm by CT.  Patient had significant dyspnea on exertion and had replacement of the ascending aorta with 26 Hemashield graft.  The aortic valve was "healthy "with minor calcification small median raphae with slight right-left cusp fusion but    Unspecified osteoarthritis, unspecified site 06/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 R knee microfracture surgery/lateral patellar release 01/2013 [facility]   Upper GI bleeding 06/26/2009    Past Surgical History:  Procedure Laterality Date   APPENDECTOMY  1993   REPLACEMENT TOTAL KNEE  2004   REPLACEMENT TOTAL KNEE  2014   TONSILLECTOMY  1976    Family History  Problem Relation Age of Onset   Hypertension Mother    Stroke Mother    Colon  cancer Mother    Cancer Father    Arthritis Father    Seizures Sister     Social History:  reports that she has never smoked. She has never used smokeless tobacco. She reports that she does not drink alcohol and does not use drugs.  Allergies:  Allergies  Allergen Reactions   Celecoxib Rash    Acute generalized. exanthematous Acute generalized. exanthematous Acute generalized. exanthematous Acute generalized. exanthematous    Hydrochlorothiazide Other (See Comments)   Other Other (See Comments)    Other reaction(s): Other (See Comments) Vomiting/ GI bleed Other reaction(s): Other (See Comments) Vomiting/ GI bleed Vomiting/ GI bleed Other reaction(s): Other (See Comments) Vomiting/ GI bleed Vomiting/ GI bleed Vomiting/ GI bleed Other reaction(s): Other (See Comments) Vomiting/ GI bleed Vomiting/ GI bleed    Cefpodoxime Other (See Comments)    Trouble breathing, fast/pounding heartbeat, nausea, blurry vision, slowed speech, stomach cramps, confusion, inability to lift head off pillow   Cefuroxime Other (See Comments)    Tachycardia, dark urine, nausea, severe diarrhea, stomach pain and cramping, severe listlessness, weakness, slurred speech, confusion, slept 14 hours, unable to walk alone   Cymbalta [Duloxetine Hcl] Other (See Comments)    No appetite, inability to stay awake, nausea, severe itching, dark urine, extremelty weak, shaky, severe headache   Fentanyl     "rapid heartbeat"   Nsaids Other (See Comments)    Gastric bleeds, oral form only    Serotonin Reuptake Inhibitors (Ssris) Other (See Comments)    Nausea, dizziness, severe fatigue, anxiety, lethargy, HA, severe agitation, excessive sleeping   Codeine Itching   Duloxetine Itching, Nausea And Vomiting and Rash    Hard to wake up, couldn't walk Hard to wake up, couldn't walk. (AGEP) Hard to wake up, couldn't walk Hard to wake up, couldn't walk. (AGEP) Hard to wake up, couldn't walk Hard to wake up, couldn't walk. (AGEP) Hard to wake up, couldn't walk Hard to wake up, couldn't  walk. (AGEP)     Medications Prior to Admission  Medication Sig Dispense Refill   acyclovir (ZOVIRAX) 400 MG tablet Take 400 mg by mouth 2 (two) times daily.     albuterol (ACCUNEB) 1.25 MG/3ML nebulizer solution Take 1 ampule by nebulization every 6 (six) hours as needed.     esomeprazole (NEXIUM) 40 MG capsule Take 40 mg by mouth 2 (two) times daily.     estradiol (ESTRACE) 0.5 MG tablet Take 0.5 mg by mouth daily.     gabapentin (NEURONTIN) 300 MG capsule Take 900 mg by mouth at bedtime.     hydrOXYzine (VISTARIL) 50 MG capsule Take 1 capsule by mouth 4 (four) times daily as needed for anxiety.     Ketorolac Tromethamine 15.75 MG/SPRAY SOLN Place 15.75 mg into the nose every 6 (six) hours as needed.     lamoTRIgine (LAMICTAL) 200 MG tablet Take 200 mg by mouth 2 (two) times daily.     Melatonin 5 MG CAPS Take 5 mg by mouth daily.     morphine (MS CONTIN) 15 MG 12 hr tablet Take 15 mg by mouth every 12 (twelve) hours.     morphine (MSIR) 15 MG tablet Take 1-2 tablets by mouth every 6 (six) hours as needed.     ondansetron (ZOFRAN-ODT) 8 MG disintegrating tablet Take 8 mg by mouth 3 (three) times daily.     Oxcarbazepine (TRILEPTAL) 300 MG tablet Take 300 mg by mouth 2 (two) times daily.     promethazine (PHENERGAN) 12.5 MG tablet Take  12.5 mg by mouth daily as needed.     promethazine (PHENERGAN) 6.25 MG/5ML syrup Take by mouth every 6 (six) hours as needed for nausea or vomiting.     Suvorexant (BELSOMRA PO) Take 20 mg by mouth.     traZODone (DESYREL) 150 MG tablet Take 150 mg by mouth at bedtime.     traZODone (DESYREL) 50 MG tablet Take 50 mg by mouth as needed for sleep.     Ascorbic Acid (VITAMIN C PO) Take by mouth.     B Complex-C (B-COMPLEX WITH VITAMIN C) tablet Take 1 tablet by mouth daily.     Calcium Carb-Cholecalciferol (TGT CALCIUM DIETARY SUPPLEMENT PO) Take by mouth.     Chromium Picolinate (CHROMIUM PICOLATE PO) Take by mouth.     furosemide (LASIX) 20 MG tablet Take  20 mg by mouth daily.     metoCLOPramide (REGLAN) 10 MG tablet Take 10 mg by mouth 4 (four) times daily as needed.     Multiple Vitamins-Minerals (CENTRUM MEN) TABS Take 1 tablet by mouth daily.     Zinc Sulfate (ZINC 15 PO) Take by mouth.      No results found for this or any previous visit (from the past 48 hour(s)). No results found.  Review of Systems no recent fever, chills, nausea, vomiting or changes in her appetite  Blood pressure (!) 130/53, pulse 68, temperature 99.3 F (37.4 C), temperature source Oral, resp. rate 19, height 5\' 4"  (1.626 m), weight 110.7 kg, SpO2 99 %. Physical Exam  Overweight female in no apparent distress.  Alert and oriented.  Normal mood and affect.  Gait is nonweightbearing on the right.  Grossly varus malalignment of the tibial shaft.  Skin is intact.  Pulses are palpable in the foot.  Intact sensibility to light touch dorsally and plantarly at the forefoot.  Active plantar flexion and dorsiflexion strength of the ankle and toes.   Assessment/Plan Right tibial shaft nonunion -to the operating room today for open treatment of this painful and limiting right leg injury.  The risks and benefits of the alternative treatment options have been discussed in detail.  The patient wishes to proceed with surgery and specifically understands risks of bleeding, infection, nerve damage, blood clots, need for additional surgery, amputation and death.   , MD 2021/08/16, 9:07 AM

## 2021-07-24 ENCOUNTER — Encounter (HOSPITAL_BASED_OUTPATIENT_CLINIC_OR_DEPARTMENT_OTHER): Payer: Self-pay | Admitting: Orthopedic Surgery

## 2022-04-18 DIAGNOSIS — M069 Rheumatoid arthritis, unspecified: Secondary | ICD-10-CM

## 2022-04-18 DIAGNOSIS — N12 Tubulo-interstitial nephritis, not specified as acute or chronic: Secondary | ICD-10-CM

## 2022-04-18 DIAGNOSIS — T402X5A Adverse effect of other opioids, initial encounter: Secondary | ICD-10-CM | POA: Insufficient documentation

## 2022-04-18 DIAGNOSIS — Z79899 Other long term (current) drug therapy: Secondary | ICD-10-CM

## 2022-04-18 HISTORY — DX: Rheumatoid arthritis, unspecified: M06.9

## 2022-04-18 HISTORY — DX: Tubulo-interstitial nephritis, not specified as acute or chronic: N12

## 2022-04-18 HISTORY — DX: Other long term (current) drug therapy: Z79.899

## 2022-04-18 HISTORY — DX: Adverse effect of other opioids, initial encounter: T40.2X5A

## 2022-04-19 DIAGNOSIS — R7881 Bacteremia: Secondary | ICD-10-CM | POA: Insufficient documentation

## 2022-04-19 HISTORY — DX: Bacteremia: R78.81

## 2022-04-19 HISTORY — DX: Morbid (severe) obesity due to excess calories: E66.01

## 2022-09-19 ENCOUNTER — Telehealth: Payer: Self-pay | Admitting: Gastroenterology

## 2022-09-19 NOTE — Telephone Encounter (Signed)
Hi. Dr Lyndel Safe,   This patient transfer of care was done by the crew back in 2021 to you and was accepted in our facility. However patient was not schedule but called today to proceed. Can she still proceed with the transfer of care? Please advise on scheduling.   Thank you

## 2022-09-23 ENCOUNTER — Encounter: Payer: Self-pay | Admitting: Gastroenterology

## 2022-09-23 NOTE — Telephone Encounter (Signed)
Can still proceed. If she is having dysphagia, lets get barium swallow with barium tablet. Can schedule her in my clinic or App  clinic RG

## 2022-09-23 NOTE — Telephone Encounter (Signed)
Patient is scheduled for an Ov on 1-4.

## 2022-09-26 ENCOUNTER — Ambulatory Visit: Payer: 59 | Admitting: Allergy and Immunology

## 2022-09-26 ENCOUNTER — Ambulatory Visit (INDEPENDENT_AMBULATORY_CARE_PROVIDER_SITE_OTHER): Payer: 59 | Admitting: Allergy and Immunology

## 2022-09-26 VITALS — BP 162/92 | HR 100 | Resp 14 | Ht 64.0 in | Wt 244.0 lb

## 2022-09-26 DIAGNOSIS — B999 Unspecified infectious disease: Secondary | ICD-10-CM

## 2022-09-26 DIAGNOSIS — D849 Immunodeficiency, unspecified: Secondary | ICD-10-CM | POA: Diagnosis not present

## 2022-09-26 DIAGNOSIS — J4540 Moderate persistent asthma, uncomplicated: Secondary | ICD-10-CM | POA: Diagnosis not present

## 2022-09-26 DIAGNOSIS — J3089 Other allergic rhinitis: Secondary | ICD-10-CM | POA: Diagnosis not present

## 2022-09-26 DIAGNOSIS — K219 Gastro-esophageal reflux disease without esophagitis: Secondary | ICD-10-CM

## 2022-09-26 DIAGNOSIS — G4720 Circadian rhythm sleep disorder, unspecified type: Secondary | ICD-10-CM

## 2022-09-26 DIAGNOSIS — G43909 Migraine, unspecified, not intractable, without status migrainosus: Secondary | ICD-10-CM

## 2022-09-26 MED ORDER — ESOMEPRAZOLE MAGNESIUM 40 MG PO CPDR: DELAYED_RELEASE_CAPSULE | ORAL | 5 refills | Status: DC

## 2022-09-26 MED ORDER — ALBUTEROL SULFATE HFA 108 (90 BASE) MCG/ACT IN AERS: INHALATION_SPRAY | RESPIRATORY_TRACT | 1 refills | Status: DC

## 2022-09-26 MED ORDER — FAMOTIDINE 40 MG PO TABS: 40.0000 mg | ORAL_TABLET | Freq: Every evening | ORAL | 5 refills | Status: DC

## 2022-09-26 MED ORDER — BUDESONIDE-FORMOTEROL FUMARATE 160-4.5 MCG/ACT IN AERO: INHALATION_SPRAY | RESPIRATORY_TRACT | 5 refills | Status: DC

## 2022-09-26 NOTE — Progress Notes (Signed)
[place] - [place] - [place] - [place] - [place]   Dear Dr. Deanne Coffer,  Thank you for referring Audrey Cannon to the [facility] [place] on 09/26/2022.   Below is a summation of this patient's evaluation and recommendations.  Thank you for your referral. I will keep you informed about this patient's response to treatment.   If you have any questions please do not hesitate to contact me.   Sincerely,  Jessica Priest, MDmeprazole Allergy / Immunology [place] [facility]   ______________________________________________________________________    NEW PATIENT NOTE  Referring Provider: Casimer Lanius, MD Primary Provider: Lucianne Lei, MD Date of office visit: 09/26/2022    Subjective:   Chief Complaint:  Audrey Cannon (DOB: 10/19/1966) is a 56 y.o. female who presents to the clinic on 09/26/2022 with a chief complaint of Cough and Allergies .     HPI: Audrey Cannon presents to this clinic in evaluation of recurrent infections and persistent respiratory tract symptoms.  She states that she has recurrent infections in the form of "sinus" and "bronchitis" multiple times per year and she is treated with antibiotics multiple times per year.  Her rheumatologist has investigated her blood and felt that she might have some form of immunodeficiency.  Other than her respiratory tract she really does not have any other infections that are persistent or difficult to treat.  She has had a documented pneumonia and a bacteremia.  Her bacteremia may have been associated with a episode of UTI/pyelonephritis.  She has chronic coughing and chronic postnasal drip and inability to clear out her chest or her throat.  When she uses a short acting bronchodilator her cough does respond to this treatment.  She also has lots of sniffing and snorting.  She does not have any anosmia.  She does have chronic daily headaches and apparently  has been on Botox injections in the past.  She describes frontal and temporal pounding headache that sometimes involves her cheeks and explodes when it gets really bad.  She has very dysfunctional sleep.  She has trouble initiating sleep and she has fractured sleep throughout the day.  She has never had a sleep study.  She has reflux with regurgitation up into her throat.  She has seen a gastroenterologist in the past and has had an upper endoscopy performed.  There may have been a Zenker's diverticulum that was repaired at some point in the past based on her description of a procedure but I cannot see documentation of the this procedure in her electronic medical record.  She has very bad problems swallowing.  Sometimes food gets stuck in her throat.  This does not appear to be be an issue with obstruction of swallowing deeper in her chest.  She is currently immunosuppressed using Humira for rheumatoid arthritis.  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 DAVID DOMINGUEZ, NP ([phone]) [place] [facility] of this note might be different from the original. 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 C5-6 fusion 10/2010 [facility]surgery;(hx [facility] BOTOX INJECTIONS, CHIROPRACTER, MASSAGE [place]) Formatting of this note might be different from the original. C5-6 fusion 10/2010 [facility]  Neurosurgery;(hx [place] [facility] BOTOX INJECTIONS, CHIROPRACTER, MASSAGE [place])   Elevated white blood cell count, unspecified 07/16/2016   Fibromyalgia 06/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 colonoscopy 2007 negative, repeat Nov 2011 negative (fam hx, repeat 2016)   High risk medications (not anticoagulants) long-term use 05/08/2011   Formatting of this note might be different from the original. [facility] Medicine (contract scanned into [employer]);#120 Oxycodone/month;Most recent UDS: 04/2011 (appropriate) Formatting of this note might be different from the original. [facility] Medicine (contract scanned into [employer]CIS);#120 Oxycodone/month;Most recent UDS: 04/2011 (appropriate)   Hyponatremia 01/15/2019   Hypothyroidism 09/10/2011   MDD (major depressive disorder), recurrent episode, severe (HCC) 03/16/2019   Migraine 06/26/2009   Oral aphthae 08/15/2011   Polyarthralgia 12/23/2018   PONV (postoperative nausea and vomiting)    Post concussion syndrome 03/16/2019   Post traumatic stress disorder (PTSD) 03/16/2019   Recurrent major depressive disorder, in partial remission (HCC) 09/01/2017   States associated with artificial menopause 06/26/2009   TBI (traumatic brain injury) (HCC) 11/01/2015   Thoracic aortic aneurysm without rupture (HCC) 09/01/2017   Formatting of this note might be different from the original. H/o repair 2/15 outside records indicate moderate aortic regurgitation and aneurysm measuring 4.6 cm by CT.  Patient had significant dyspnea on exertion and had replacement of the ascending aorta with 26 Hemashield graft.  The aortic valve was "healthy "with minor  calcification small median raphae with slight right-left cusp fusion but    Unspecified osteoarthritis, unspecified site 06/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 exanthematous Acute generalized. exanthematous Acute generalized. exanthematous Acute generalized. exanthematous   Hydrochlorothiazide Other (See Comments)   Other Other (See Comments)   Other reaction(s): Other (See Comments) Vomiting/ GI bleed Other reaction(s): Other (See Comments) Vomiting/ GI bleed Vomiting/ GI bleed Other reaction(s): Other (See Comments) Vomiting/ GI bleed Vomiting/ GI bleed Vomiting/ GI bleed Other reaction(s): Other (See Comments) Vomiting/ GI bleed Vomiting/ GI bleed   Cefpodoxime Other (See Comments)   Trouble breathing, fast/pounding heartbeat, nausea, blurry vision, slowed speech, stomach cramps, confusion, inability to lift head off pillow   Cefuroxime Other (See Comments)   Tachycardia, dark urine, nausea, severe diarrhea, stomach pain and cramping, severe listlessness, weakness, slurred speech,  confusion, slept 14 hours, unable to walk alone   Cymbalta [duloxetine Hcl] Other (See Comments)   No appetite, inability to stay awake, nausea, severe itching, dark urine, extremelty weak, shaky, severe headache   Fentanyl    "rapid heartbeat"   Nsaids Other (See  Comments)   Gastric bleeds, oral form only   Serotonin Reuptake Inhibitors (ssris) Other (See Comments)   Nausea, dizziness, severe fatigue, anxiety, lethargy, HA, severe agitation, excessive sleeping   Codeine Itching   Duloxetine Itching, Nausea And Vomiting, Rash   Hard to wake up, couldn't walk Hard to wake up, couldn't walk. (AGEP) Hard to wake up, couldn't walk Hard to wake up, couldn't walk. (AGEP) Hard to wake up, couldn't walk Hard to wake up, couldn't walk. (AGEP) Hard to wake up, couldn't walk Hard to wake up, couldn't walk. (AGEP)        Medication List    acyclovir 400 MG tablet Commonly known as: ZOVIRAX Take 400 mg by mouth 2 (two) times daily.   albuterol 108 (90 Base) MCG/ACT inhaler Commonly known as: VENTOLIN HFA 2 inhalations every 4-6 hours as needed Replaces: albuterol 1.25 MG/3ML nebulizer solution   amphetamine-dextroamphetamine 30 MG tablet Commonly known as: ADDERALL Take by mouth.   BELSOMRA PO Take 20 mg by mouth.   budesonide-formoterol 160-4.5 MCG/ACT inhaler Commonly known as: Symbicort 2 inhalations 2 times per day with spacer (empty lungs)   cetirizine 10 MG tablet Commonly known as: ZYRTEC Take by mouth.   cloNIDine 0.1 MG tablet Commonly known as: CATAPRES Take 0.1 mg by mouth 2 (two) times daily.   cyclobenzaprine 10 MG tablet Commonly known as: FLEXERIL TAKE 1 TABLET BY MOUTH THREE TIMES DAILY AS NEEDED FOR SPASM   dicyclomine 10 MG capsule Commonly known as: BENTYL TAKE 1 CAPSULE BY MOUTH TWICE DAILY AS NEEDED   esomeprazole 20 MG capsule Commonly known as: NexIUM 1 capsule 2 times per day   estradiol 0.5 MG tablet Commonly known as: ESTRACE Take 0.5 mg by mouth daily.   famotidine 40 MG tablet Commonly known as: PEPCID Take 1 tablet (40 mg total) by mouth every evening.   gabapentin 300 MG capsule Commonly known as: NEURONTIN Take 900 mg by mouth at bedtime.   Humira Pen 40 MG/0.4ML Pnkt Generic drug:  Adalimumab SMARTSIG:40 Milligram(s) SUB-Q Every 2 Weeks   hydrOXYzine 50 MG capsule Commonly known as: VISTARIL Take 1 capsule by mouth 4 (four) times daily as needed for anxiety.   lamoTRIgine 200 MG tablet Commonly known as: LAMICTAL Take 200 mg by mouth 2 (two) times daily.   lidocaine 5 % Commonly known as: LIDODERM 1 patch daily.   Melatonin 5 MG Caps Take 5 mg by mouth daily.   ondansetron 8 MG disintegrating tablet Commonly known as: ZOFRAN-ODT Take 8 mg by mouth 3 (three) times daily.   Oxcarbazepine 300 MG tablet Commonly known as: TRILEPTAL Take 300 mg by mouth 2 (two) times daily.   Oxycodone HCl 10 MG Tabs   promethazine 12.5 MG tablet Commonly known as: PHENERGAN Take 12.5 mg by mouth daily as needed.   TGT CALCIUM DIETARY SUPPLEMENT PO Take by mouth.   traZODone 150 MG tablet Commonly known as: DESYREL Take 150 mg by mouth at bedtime.   Xtampza ER 36 MG C12a Generic drug: oxyCODONE ER TAKE 1 CAPSULE BY MOUTH EVERY 12 HOURS FOR PAIN        Review of systems negative except as noted in HPI / PMHx or noted below:  Review of Systems  Constitutional: Negative.   HENT: Negative.    Eyes: Negative.   Respiratory: Negative.    Cardiovascular: Negative.   Gastrointestinal: Negative.   Genitourinary: Negative.   Musculoskeletal: Negative.   Skin: Negative.   Neurological: Negative.   Endo/Heme/Allergies: Negative.   Psychiatric/Behavioral: Negative.      Family History  Problem Relation Age of Onset   Hypertension Mother    Stroke Mother    Colon cancer Mother    Cancer Father    Arthritis Father    Seizures Sister     Social History   Socioeconomic History   Marital status: Married    Spouse name: Not on file   Number of children: Not on file   Years of education: Not on file   Highest education level: Not on file  Occupational History   Not on file  Tobacco Use   Smoking status: Never   Smokeless tobacco: Never  Substance  and Sexual Activity   Alcohol use: Never   Drug use: Never   Sexual activity: Not on file  Other Topics Concern   Not on file  Social History Narrative   Not on file   Environmental and Social history  Lives in a house with a dry environment, a dog located inside the household, hardwood in the bedroom, plastic on the bed, no plastic on the pillow, and no smoking ongoing with inside the household.  Objective:   Vitals:   09/26/22 0959  BP: (!) 162/92  Pulse: 100  Resp: 14  SpO2: 97%   Height: 5\' 4"  (162.6 cm) Weight: 244 lb (110.7 kg)  Physical Exam Constitutional:      Comments: Constant coughing and clearing     Diagnostics: Allergy skin tests were not performed secondary to the recent use of an antihistamine.  Spirometry was performed and demonstrated an FEV1 of 1.94 @ 74 % of predicted. FEV1/FVC = 0.93  Results of a chest x-ray obtained 24 June 2022 identifies the following:  FINDINGS: No pneumonia or edema. No pleural effusions. Stable heart and mediastinum. Slightly tortuous aorta. Stable visualized bony structures including degenerative changes visualized spine. Again demonstrated is a spinal stimulator. Prior median sternotomy and cardiac surgery. No pneumothorax.   Assessment and Plan:    1. Not well controlled moderate persistent asthma   2. Other allergic rhinitis   3. Recurrent infections   4. Immunosuppression (HCC)   5. LPRD (laryngopharyngeal reflux disease)   6. Migraine syndrome   7. Dysfunction of sleep stage or arousal    1.  Blood - IgA/G/M, antipneumococcal ab, antitetanus ab, area 2 aeroallergen profile, CBC w/d  2.  Treat and prevent reflux/LPR:   A. Increase esomeprazole 40 mg - 2 times per day  B. Start famotidine 40 mg - 1 tablet in PM  C. Replace throat clearing with swallowing/drinking maneuver  3. Treat and prevent inflammation of airway:   A. Symbicort 160 - 2 inhalations 2 times per day w/spacer (empty lungs)  B. Nasacort - 1  spray each nostril 1 time per day  4. If needed:   A.  Albuterol HFA-2 inhalations or nebulization every 4-6 hours   5.  Evaluate headaches and sleep dysfunction:   A.  Home sleep test  6.  Obtain flu vaccine  7.  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  She certainly has an inflamed and irritated respiratory tract and I think that one of her triggers is significant reflux and we will treat her little bit more aggressively for her LPR as noted above while she continues on anti-inflammatory agents for her airway.  She also has chronic daily headache and sleep dysfunction is quite significant and I think we need to evaluate her for possible sleep apnea as most of the other therapy that has been provided in the past for these issues has been ineffective in giving her any relief.  I will see her back in this clinic in 4 weeks.  I will contact her with the results of her blood test once they are available for review.  Jessica Priest, MD Allergy / Immunology [facility] [place]

## 2022-09-26 NOTE — Patient Instructions (Addendum)
  1.  Blood - IgA/G/M, antipneumococcal ab, antitetanus ab, area 2 aeroallergen profile, CBC w/d  2.  Treat and prevent reflux/LPR:   A. Increase esomeprazole 40 mg - 2 times per day  B. Start famotidine 40 mg - 1 tablet in PM  C. Replace throat clearing with swallowing/drinking maneuver  3. Treat and prevent inflammation of airway:   A. Symbicort 160 - 2 inhalations 2 times per day w/spacer (empty lungs)  B. Nasacort - 1 spray each nostril 1 time per day  4. If needed:   A.  Albuterol HFA-2 inhalations or nebulization every 4-6 hours   5.  Evaluate headaches and sleep dysfunction:   A.  Home sleep test  6.  Obtain flu vaccine  7.  Return to clinic in 4 weeks or earlier if problem

## 2022-09-30 ENCOUNTER — Encounter: Payer: Self-pay | Admitting: Allergy and Immunology

## 2022-10-01 ENCOUNTER — Other Ambulatory Visit: Payer: Self-pay | Admitting: *Deleted

## 2022-10-01 MED ORDER — PANTOPRAZOLE SODIUM 40 MG PO TBEC: 40.0000 mg | DELAYED_RELEASE_TABLET | Freq: Two times a day (BID) | ORAL | 5 refills | Status: DC

## 2022-10-02 LAB — STREP PNEUMONIAE 23 SEROTYPES IGG
Pneumo Ab Type 1*: 2.4 ug/mL (ref >1.3)
Pneumo Ab Type 12 (12F)*: 0.1 ug/mL — ABNORMAL LOW (ref >1.3)
Pneumo Ab Type 14*: 11.3 ug/mL (ref >1.3)
Pneumo Ab Type 17 (17F)*: 7.6 ug/mL (ref >1.3)
Pneumo Ab Type 19 (19F)*: 9.8 ug/mL (ref >1.3)
Pneumo Ab Type 2*: 4.7 ug/mL (ref >1.3)
Pneumo Ab Type 20*: 4 ug/mL (ref >1.3)
Pneumo Ab Type 22 (22F)*: 1.1 ug/mL — ABNORMAL LOW (ref >1.3)
Pneumo Ab Type 23 (23F)*: 0.2 ug/mL — ABNORMAL LOW (ref >1.3)
Pneumo Ab Type 26 (6B)*: 2.4 ug/mL (ref >1.3)
Pneumo Ab Type 3*: 0.6 ug/mL — ABNORMAL LOW (ref >1.3)
Pneumo Ab Type 34 (10A)*: 5.8 ug/mL (ref >1.3)
Pneumo Ab Type 4*: <0.1 ug/mL — ABNORMAL LOW (ref >1.3)
Pneumo Ab Type 43 (11A)*: 0.8 ug/mL — ABNORMAL LOW (ref >1.3)
Pneumo Ab Type 5*: 0.3 ug/mL — ABNORMAL LOW (ref >1.3)
Pneumo Ab Type 51 (7F)*: 2.1 ug/mL (ref >1.3)
Pneumo Ab Type 54 (15B)*: 0.2 ug/mL — ABNORMAL LOW (ref >1.3)
Pneumo Ab Type 56 (18C)*: 0.2 ug/mL — ABNORMAL LOW (ref >1.3)
Pneumo Ab Type 57 (19A)*: 6.1 ug/mL (ref >1.3)
Pneumo Ab Type 68 (9V)*: 2.5 ug/mL (ref >1.3)
Pneumo Ab Type 70 (33F)*: 3.4 ug/mL (ref >1.3)
Pneumo Ab Type 8*: 0.2 ug/mL — ABNORMAL LOW (ref >1.3)
Pneumo Ab Type 9 (9N)*: 3.6 ug/mL (ref >1.3)

## 2022-10-02 LAB — ALLERGENS W/TOTAL IGE AREA 2
Alternaria Alternata IgE: <0.1 kU/L
Aspergillus Fumigatus IgE: <0.1 kU/L
Bermuda Grass IgE: <0.1 kU/L
Cat Dander IgE: <0.1 kU/L
Cedar, Mountain IgE: <0.1 kU/L
Cladosporium Herbarum IgE: <0.1 kU/L
Cockroach, German IgE: <0.1 kU/L
Common Silver Birch IgE: <0.1 kU/L
Cottonwood IgE: <0.1 kU/L
D Farinae IgE: <0.1 kU/L
D Pteronyssinus IgE: <0.1 kU/L
Dog Dander IgE: <0.1 kU/L
Elm, American IgE: <0.1 kU/L
IgE (Immunoglobulin E), Serum: <2 IU/mL — ABNORMAL LOW (ref 6–495)
Johnson Grass IgE: <0.1 kU/L
Maple/Box Elder IgE: <0.1 kU/L
Mouse Urine IgE: <0.1 kU/L
Oak, White IgE: <0.1 kU/L
Pecan, Hickory IgE: <0.1 kU/L
Penicillium Chrysogen IgE: <0.1 kU/L
Pigweed, Rough IgE: <0.1 kU/L
Ragweed, Short IgE: <0.1 kU/L
Sheep Sorrel IgE Qn: <0.1 kU/L
Timothy Grass IgE: <0.1 kU/L
White Mulberry IgE: <0.1 kU/L

## 2022-10-02 LAB — CBC WITH DIFFERENTIAL
Basophils Absolute: 0.1 10*3/uL (ref 0.0–0.2)
Basos: 1 %
EOS (ABSOLUTE): 0.2 10*3/uL (ref 0.0–0.4)
Eos: 2 %
Hematocrit: 42.7 % (ref 34.0–46.6)
Hemoglobin: 14.4 g/dL (ref 11.1–15.9)
Immature Grans (Abs): 0 10*3/uL (ref 0.0–0.1)
Immature Granulocytes: 0 %
Lymphocytes Absolute: 3.6 10*3/uL — ABNORMAL HIGH (ref 0.7–3.1)
Lymphs: 39 %
MCH: 30.8 pg (ref 26.6–33.0)
MCHC: 33.7 g/dL (ref 31.5–35.7)
MCV: 91 fL (ref 79–97)
Monocytes Absolute: 1.4 10*3/uL — ABNORMAL HIGH (ref 0.1–0.9)
Monocytes: 15 %
Neutrophils Absolute: 3.9 10*3/uL (ref 1.4–7.0)
Neutrophils: 43 %
RBC: 4.67 x10E6/uL (ref 3.77–5.28)
RDW: 13 % (ref 11.7–15.4)
WBC: 9.2 10*3/uL (ref 3.4–10.8)

## 2022-10-02 LAB — IGG, IGA, IGM
IgA/Immunoglobulin A, Serum: 116 mg/dL (ref 87–352)
IgG (Immunoglobin G), Serum: 663 mg/dL (ref 586–1602)
IgM (Immunoglobulin M), Srm: 165 mg/dL (ref 26–217)

## 2022-10-02 LAB — DIPHTHERIA / TETANUS ANTIBODY PANEL
Diphtheria Ab: 0.14 IU/mL (ref <0.10)
Tetanus Ab, IgG: 0.53 IU/mL (ref <0.10)

## 2022-10-03 ENCOUNTER — Telehealth: Payer: Self-pay

## 2022-10-03 NOTE — Telephone Encounter (Signed)
Home sleep Test order form has been faxed over to [employer] along with insurance copy and demographics. They will contact patient to set up sleep test.

## 2022-10-21 ENCOUNTER — Ambulatory Visit: Payer: 59 | Admitting: Allergy and Immunology

## 2022-10-23 ENCOUNTER — Encounter: Payer: Self-pay | Admitting: Allergy and Immunology

## 2022-10-23 ENCOUNTER — Ambulatory Visit (INDEPENDENT_AMBULATORY_CARE_PROVIDER_SITE_OTHER): Payer: 59 | Admitting: Allergy and Immunology

## 2022-10-23 VITALS — BP 138/82 | HR 83 | Resp 16

## 2022-10-23 DIAGNOSIS — D849 Immunodeficiency, unspecified: Secondary | ICD-10-CM | POA: Diagnosis not present

## 2022-10-23 DIAGNOSIS — J4540 Moderate persistent asthma, uncomplicated: Secondary | ICD-10-CM

## 2022-10-23 DIAGNOSIS — B999 Unspecified infectious disease: Secondary | ICD-10-CM | POA: Diagnosis not present

## 2022-10-23 DIAGNOSIS — G4720 Circadian rhythm sleep disorder, unspecified type: Secondary | ICD-10-CM

## 2022-10-23 DIAGNOSIS — K219 Gastro-esophageal reflux disease without esophagitis: Secondary | ICD-10-CM

## 2022-10-23 DIAGNOSIS — G43909 Migraine, unspecified, not intractable, without status migrainosus: Secondary | ICD-10-CM

## 2022-10-23 DIAGNOSIS — D72820 Lymphocytosis (symptomatic): Secondary | ICD-10-CM

## 2022-10-23 NOTE — Progress Notes (Signed)
[place]   Follow-up Note  Referring Provider: Nicholos Johns, MD Primary Provider: Nicholos Johns, MD Date of Office Visit: 10/23/2022  Subjective:   Audrey Cannon (DOB: 1966/09/05) is a 56 y.o. female who returns to the [facility] [place] on 10/23/2022 in re-evaluation of the following:  HPI: Audrey Cannon returns to this clinic in reevaluation of asthma, history of recurrent infections, immunosuppression with Humira for rheumatoid arthritis, LPR, migraine, and sleep dysfunction.  I last saw her in this clinic during her initial evaluation of 26 September 2022.  We increased her Nexium and added in famotidine to address what appeared to be a component of LPR contributing to her cough and she unfortunately had a side effect from the use of this combination and we switched her to pantoprazole and continued famotidine.  She still has lots of belching and she has lots of regurgitation and she has emesis.  She has an appointment to see Dr. Lyndel Cannon, GI, for January 2024.  She has been using her Symbicort.  She still continues to cough.  She still has headaches.  She still has very disrupted sleep.  Allergies as of 10/23/2022       Reactions   Celecoxib Rash   Acute generalized. exanthematous Acute generalized. exanthematous Acute generalized. exanthematous Acute generalized. exanthematous   Hydrochlorothiazide Other (See Comments)   Other Other (See Comments)   Other reaction(s): Other (See Comments) Vomiting/ GI bleed Other reaction(s): Other (See Comments) Vomiting/ GI bleed Vomiting/ GI bleed Other reaction(s): Other (See Comments) Vomiting/ GI bleed Vomiting/ GI bleed Vomiting/ GI bleed Other reaction(s): Other (See Comments) Vomiting/ GI bleed Vomiting/ GI bleed   Cefpodoxime Other (See Comments)   Trouble breathing, fast/pounding heartbeat, nausea, blurry vision, slowed speech, stomach cramps, confusion, inability to lift  head off pillow   Cefuroxime Other (See Comments)   Tachycardia, dark urine, nausea, severe diarrhea, stomach pain and cramping, severe listlessness, weakness, slurred speech, confusion, slept 14 hours, unable to walk alone   Cymbalta [duloxetine Hcl] Other (See Comments)   No appetite, inability to stay awake, nausea, severe itching, dark urine, extremelty weak, shaky, severe headache   Fentanyl    "rapid heartbeat"   Nsaids Other (See Comments)   Gastric bleeds, oral form only   Serotonin Reuptake Inhibitors (ssris) Other (See Comments)   Nausea, dizziness, severe fatigue, anxiety, lethargy, HA, severe agitation, excessive sleeping   Codeine Itching   Duloxetine Itching, Nausea And Vomiting, Rash   Hard to wake up, couldn't walk Hard to wake up, couldn't walk. (AGEP) Hard to wake up, couldn't walk Hard to wake up, couldn't walk. (AGEP) Hard to wake up, couldn't walk Hard to wake up, couldn't walk. (AGEP) Hard to wake up, couldn't walk Hard to wake up, couldn't walk. (AGEP)        Medication List    acyclovir 400 MG tablet Commonly known as: ZOVIRAX Take 400 mg by mouth 2 (two) times daily.   albuterol 108 (90 Base) MCG/ACT inhaler Commonly known as: VENTOLIN HFA 2 inhalations every 4-6 hours as needed   amphetamine-dextroamphetamine 30 MG tablet Commonly known as: ADDERALL Take by mouth.   BELSOMRA PO Take 20 mg by mouth.   budesonide-formoterol 160-4.5 MCG/ACT inhaler Commonly known as: Symbicort 2 inhalations 2 times per day with spacer (empty lungs)   cetirizine 10 MG tablet Commonly known as: ZYRTEC Take by mouth.   cloNIDine 0.1 MG tablet Commonly known as: CATAPRES Take 0.1 mg by  mouth 2 (two) times daily.   cyclobenzaprine 10 MG tablet Commonly known as: FLEXERIL TAKE 1 TABLET BY MOUTH THREE TIMES DAILY AS NEEDED FOR SPASM   dicyclomine 10 MG capsule Commonly known as: BENTYL TAKE 1 CAPSULE BY MOUTH TWICE DAILY AS NEEDED   esomeprazole 40 MG  capsule Commonly known as: NexIUM 1 capsule 2 times per day   estradiol 0.5 MG tablet Commonly known as: ESTRACE Take 0.5 mg by mouth daily.   famotidine 40 MG tablet Commonly known as: PEPCID Take 1 tablet (40 mg total) by mouth every evening.   gabapentin 300 MG capsule Commonly known as: NEURONTIN Take 900 mg by mouth at bedtime.   Humira Pen 40 MG/0.4ML Pnkt Generic drug: Adalimumab SMARTSIG:40 Milligram(s) SUB-Q Every 2 Weeks   hydrOXYzine 50 MG capsule Commonly known as: VISTARIL Take 1 capsule by mouth 4 (four) times daily as needed for anxiety.   lamoTRIgine 200 MG tablet Commonly known as: LAMICTAL Take 200 mg by mouth 2 (two) times daily.   lidocaine 5 % Commonly known as: LIDODERM 1 patch daily.   Melatonin 5 MG Caps Take 5 mg by mouth daily.   ondansetron 8 MG disintegrating tablet Commonly known as: ZOFRAN-ODT Take 8 mg by mouth 3 (three) times daily.   Oxcarbazepine 300 MG tablet Commonly known as: TRILEPTAL Take 300 mg by mouth 2 (two) times daily.   Oxycodone HCl 10 MG Tabs   pantoprazole 40 MG tablet Commonly known as: Protonix Take 1 tablet (40 mg total) by mouth 2 (two) times daily.   promethazine 12.5 MG tablet Commonly known as: PHENERGAN Take 12.5 mg by mouth daily as needed.   TGT CALCIUM DIETARY SUPPLEMENT PO Take by mouth.   traZODone 150 MG tablet Commonly known as: DESYREL Take 150 mg by mouth at bedtime.   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 DAVID DOMINGUEZ, NP [phone]) [place] [facility] of this note might be different from the original. 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 C5-6 fusion 10/2010 [facility];(hx [place] [facility] BOTOX INJECTIONS, CHIROPRACTER, MASSAGE [place]) Formatting of this note might be different from the original. C5-6 fusion 10/2010 [facility];(hx [place] [facility] BOTOX INJECTIONS, CHIROPRACTER, MASSAGE [place])   Elevated white blood cell count, unspecified 07/16/2016   Fibromyalgia 06/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 colonoscopy 2007 negative, repeat Nov 2011 negative (fam hx, repeat 2016)   High risk medications (not anticoagulants) long-term use 05/08/2011   Formatting  of this note might be different from the original. [place] (contract scanned into [employer]);#120 Oxycodone/month;Most recent UDS: 04/2011 (appropriate) Formatting of this note might be different from the original. [place] (contract scanned into [employer]);#120 Oxycodone/month;Most recent UDS: 04/2011 (appropriate)   Hyponatremia 01/15/2019   Hypothyroidism 09/10/2011   MDD (major depressive disorder), recurrent episode, severe ([place]) 03/16/2019   Migraine 06/26/2009   Oral aphthae 08/15/2011   Polyarthralgia 12/23/2018   PONV (postoperative nausea and vomiting)    Post concussion syndrome 03/16/2019   Post traumatic stress disorder (PTSD) 03/16/2019    Recurrent major depressive disorder, in partial remission ([place]) 09/01/2017   States associated with artificial menopause 06/26/2009   TBI (traumatic brain injury) ([place]) 11/01/2015   Thoracic aortic aneurysm without rupture ([place]) 09/01/2017   Formatting of this note might be different from the original. H/o repair 2/15 outside records indicate moderate aortic regurgitation and aneurysm measuring 4.6 cm by CT.  Patient had significant dyspnea on exertion and had replacement of the ascending aorta with 26 Hemashield graft.  The aortic valve was "healthy "with minor calcification small median raphae with slight right-left cusp fusion but    Unspecified osteoarthritis, unspecified site 06/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   HENT: Negative.    Eyes: Negative.   Respiratory: Negative.    Cardiovascular: Negative.   Gastrointestinal: Negative.   Genitourinary: Negative.   Musculoskeletal: Negative.   Skin: Negative.   Neurological: Negative.   Endo/Heme/Allergies: Negative.   Psychiatric/Behavioral: Negative.       Objective:   Vitals:   10/23/22 1126  BP: 138/82  Pulse: 83  Resp: 16  SpO2: 98%          Physical Exam Constitutional:      Appearance: She is not diaphoretic.  HENT:     Head: Normocephalic.      Right Ear: Tympanic membrane, ear canal and external ear normal.     Left Ear: Tympanic membrane, ear canal and external ear normal.     Nose: Nose normal. No mucosal edema or rhinorrhea.     Mouth/Throat:     Pharynx: Uvula midline. No oropharyngeal exudate.  Eyes:     Conjunctiva/sclera: Conjunctivae normal.  Neck:     Thyroid: No thyromegaly.     Trachea: Trachea normal. No tracheal tenderness or tracheal deviation.  Cardiovascular:     Rate and Rhythm: Normal rate and regular rhythm.     Heart sounds: Normal heart sounds, S1 normal and S2 normal. No murmur heard. Pulmonary:     Effort: No respiratory distress.     Breath sounds: Normal breath sounds. No stridor. No wheezing or rales.  Lymphadenopathy:     Head:     Right side of head: No tonsillar adenopathy.     Left side of head:  No tonsillar adenopathy.     Cervical: No cervical adenopathy.  Skin:    Findings: No erythema or rash.     Nails: There is no clubbing.  Neurological:     Mental Status: She is alert.     Diagnostics:    Spirometry was performed and demonstrated an FEV1 of 1.96 at 75 % of predicted.  Results of blood tests obtained 26 September 2022 identified WBC 9.2, absolute eosinophil 200, absolute monoocyte 1400, absolute lymphocyte 3600, IgG 663 mg/DL, IgA 116 MGs/DL, IgM 165 MGs/DL, IgE less than 2 KU/L, no antigen specific IgE on a area two aero allergen IgE profile, 10 out of 23 pneumococcal serotypes without adequate antibody levels, tetanus IgG antibody 0.53U/mL, diphtheria antibody 0.14U/mL  Results of a home sleep study performed 16 October 2022 identified RDI = 0.5, total apneas 2, lowest oxygen saturation 91%.  Assessment and Plan:   1. Not well controlled moderate persistent asthma   2. Recurrent infections   3. Immunosuppression ([place])   4. LPRD (laryngopharyngeal reflux disease)   5. Migraine syndrome   6. Dysfunction of sleep stage or arousal   7. Lymphocytosis    1.  Obtain Pneumovax. 4  weeks later check Pneumo 23 serotype assay + lymphoma/ leukemia panel for lymphocytosis  2.  Treat and prevent reflux/LPR:   A. Pantoprazole 40 mg - 2 times per day  B. Discontinue famotidine  C. Replace throat clearing with swallowing/drinking maneuver  D. Visit with Dr. Lyndel Cannon 21 November 2022  E. Nissen fundoplication???  3. Treat and prevent inflammation of airway:   A. Symbicort 160 - 2 inhalations 2 times per day w/spacer (empty lungs)  B. Nasacort - 1 spray each nostril 1 time per day  4. If needed:   A.  Albuterol HFA-2 inhalations or nebulization every 4-6 hours  5.  Return to clinic in 8 weeks or earlier if problem  Mercia has continued inflammation and irritation of her airway.  It does appear as though reflux is a significant issue contributing to this problem and she will be visiting with Dr. Lyndel Cannon, GI within a month to address this issue.  She will remain on anti-inflammatory medications for her airway as noted above.  We may need to give her a biologic agent if she continues to have significant problems as she moves forward.  We will further evaluate her lymphocytosis and her immune responsiveness to immunogen exposure with the blood test noted above.  Allena Katz, MD Allergy / Immunology [place]

## 2022-10-23 NOTE — Patient Instructions (Addendum)
  1.  Obtain Pneumovax. 4 weeks later check Pneumo 23 serotype assay + lymphoma/ leukemia panel for lymphocytosis  2.  Treat and prevent reflux/LPR:   A. Pantoprazole 40 mg - 2 times per day  B. Discontinue famotidine  C. Replace throat clearing with swallowing/drinking maneuver  D. Visit with Dr. Chales Abrahams 21 November 2022  E. Nissen fundoplication???  3. Treat and prevent inflammation of airway:   A. Symbicort 160 - 2 inhalations 2 times per day w/spacer (empty lungs)  B. Nasacort - 1 spray each nostril 1 time per day  4. If needed:   A.  Albuterol HFA-2 inhalations or nebulization every 4-6 hours  5.  Return to clinic in 8 weeks or earlier if problem

## 2022-10-24 ENCOUNTER — Encounter: Payer: Self-pay | Admitting: Allergy and Immunology

## 2022-10-29 LAB — COMP PANEL: LEUKEMIA/LYMPHOMA

## 2022-10-30 LAB — STREP PNEUMONIAE 23 SEROTYPES IGG
Pneumo Ab Type 1*: 1.7 ug/mL (ref >1.3)
Pneumo Ab Type 12 (12F)*: 0.6 ug/mL — ABNORMAL LOW (ref >1.3)
Pneumo Ab Type 14*: 9.3 ug/mL (ref >1.3)
Pneumo Ab Type 17 (17F)*: 4.8 ug/mL (ref >1.3)
Pneumo Ab Type 19 (19F)*: 7.3 ug/mL (ref >1.3)
Pneumo Ab Type 2*: 4.1 ug/mL (ref >1.3)
Pneumo Ab Type 20*: 3.7 ug/mL (ref >1.3)
Pneumo Ab Type 22 (22F)*: 1.4 ug/mL (ref >1.3)
Pneumo Ab Type 23 (23F)*: 0.5 ug/mL — ABNORMAL LOW (ref >1.3)
Pneumo Ab Type 26 (6B)*: 2.3 ug/mL (ref >1.3)
Pneumo Ab Type 3*: 0.6 ug/mL — ABNORMAL LOW (ref >1.3)
Pneumo Ab Type 34 (10A)*: 4.5 ug/mL (ref >1.3)
Pneumo Ab Type 4*: 0.2 ug/mL — ABNORMAL LOW (ref >1.3)
Pneumo Ab Type 43 (11A)*: 0.6 ug/mL — ABNORMAL LOW (ref >1.3)
Pneumo Ab Type 5*: 0.5 ug/mL — ABNORMAL LOW (ref >1.3)
Pneumo Ab Type 51 (7F)*: 1.4 ug/mL (ref >1.3)
Pneumo Ab Type 54 (15B)*: 0.7 ug/mL — ABNORMAL LOW (ref >1.3)
Pneumo Ab Type 56 (18C)*: 0.3 ug/mL — ABNORMAL LOW (ref >1.3)
Pneumo Ab Type 57 (19A)*: 4.2 ug/mL (ref >1.3)
Pneumo Ab Type 68 (9V)*: 2 ug/mL (ref >1.3)
Pneumo Ab Type 70 (33F)*: 2.3 ug/mL (ref >1.3)
Pneumo Ab Type 8*: 1.1 ug/mL — ABNORMAL LOW (ref >1.3)
Pneumo Ab Type 9 (9N)*: 2.8 ug/mL (ref >1.3)

## 2022-11-21 ENCOUNTER — Other Ambulatory Visit (INDEPENDENT_AMBULATORY_CARE_PROVIDER_SITE_OTHER): Payer: Managed Care, Other (non HMO)

## 2022-11-21 ENCOUNTER — Ambulatory Visit (INDEPENDENT_AMBULATORY_CARE_PROVIDER_SITE_OTHER): Payer: Managed Care, Other (non HMO) | Admitting: Gastroenterology

## 2022-11-21 ENCOUNTER — Encounter: Payer: Self-pay | Admitting: Gastroenterology

## 2022-11-21 VITALS — BP 116/58 | HR 97 | Ht 64.0 in | Wt 236.0 lb

## 2022-11-21 DIAGNOSIS — R1013 Epigastric pain: Secondary | ICD-10-CM | POA: Diagnosis not present

## 2022-11-21 DIAGNOSIS — R1310 Dysphagia, unspecified: Secondary | ICD-10-CM

## 2022-11-21 DIAGNOSIS — K219 Gastro-esophageal reflux disease without esophagitis: Secondary | ICD-10-CM

## 2022-11-21 DIAGNOSIS — Z800 Family history of malignant neoplasm of digestive organs: Secondary | ICD-10-CM

## 2022-11-21 DIAGNOSIS — R053 Chronic cough: Secondary | ICD-10-CM

## 2022-11-21 DIAGNOSIS — K581 Irritable bowel syndrome with constipation: Secondary | ICD-10-CM

## 2022-11-21 LAB — CBC WITH DIFFERENTIAL/PLATELET
Basophils Absolute: 0 10*3/uL (ref 0.0–0.1)
Basophils Relative: 0.1 % (ref 0.0–3.0)
Eosinophils Absolute: 0 10*3/uL (ref 0.0–0.7)
Eosinophils Relative: 0.2 % (ref 0.0–5.0)
HCT: 42.4 % (ref 36.0–46.0)
Hemoglobin: 14.4 g/dL (ref 12.0–15.0)
Lymphocytes Relative: 14 % (ref 12.0–46.0)
Lymphs Abs: 1.3 10*3/uL (ref 0.7–4.0)
MCHC: 33.9 g/dL (ref 30.0–36.0)
MCV: 93.5 fl (ref 78.0–100.0)
Monocytes Absolute: 0.6 10*3/uL (ref 0.1–1.0)
Monocytes Relative: 6.7 % (ref 3.0–12.0)
Neutro Abs: 7.3 10*3/uL (ref 1.4–7.7)
Neutrophils Relative %: 79 % — ABNORMAL HIGH (ref 43.0–77.0)
Platelets: 363 10*3/uL (ref 150.0–400.0)
RBC: 4.53 Mil/uL (ref 3.87–5.11)
RDW: 13 % (ref 11.5–15.5)
WBC: 9.3 10*3/uL (ref 4.0–10.5)

## 2022-11-21 LAB — COMPREHENSIVE METABOLIC PANEL
ALT: 24 U/L (ref 0–35)
AST: 16 U/L (ref 0–37)
Albumin: 4.4 g/dL (ref 3.5–5.2)
Alkaline Phosphatase: 151 U/L — ABNORMAL HIGH (ref 39–117)
BUN: 16 mg/dL (ref 6–23)
CO2: 29 mEq/L (ref 19–32)
Calcium: 9.9 mg/dL (ref 8.4–10.5)
Chloride: 98 mEq/L (ref 96–112)
Creatinine, Ser: 0.76 mg/dL (ref 0.40–1.20)
GFR: 87.87 mL/min (ref >60.00)
Glucose, Bld: 113 mg/dL — ABNORMAL HIGH (ref 70–99)
Potassium: 4.9 mEq/L (ref 3.5–5.1)
Sodium: 135 mEq/L (ref 135–145)
Total Bilirubin: 0.4 mg/dL (ref 0.2–1.2)
Total Protein: 7 g/dL (ref 6.0–8.3)

## 2022-11-21 LAB — LIPASE: Lipase: <3 U/L — ABNORMAL LOW (ref 11.0–59.0)

## 2022-11-21 MED ORDER — PROMETHAZINE HCL 25 MG PO TABS: 25.0000 mg | ORAL_TABLET | Freq: Four times a day (QID) | ORAL | 2 refills | Status: DC | PRN

## 2022-11-21 NOTE — Addendum Note (Signed)
Addended by: Guy Franco on: 11/21/2022 05:35 PM   Modules accepted: Orders
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
## 2022-11-21 NOTE — Patient Instructions (Signed)
_______________________________________________________  If you are age 56 or older, your body mass index should be between 23-30. Your Body mass index is 40.51 kg/m. If this is out of the aforementioned range listed, please consider follow up with your Primary Care Provider.  If you are age 4 or younger, your body mass index should be between 19-25. Your Body mass index is 40.51 kg/m. If this is out of the aformentioned range listed, please consider follow up with your Primary Care Provider.   ________________________________________________________  The [facility] providers would like to encourage you to use [facility] to communicate with providers for non-urgent requests or questions.  Due to long hold times on the telephone, sending your provider a message by [facility] may be a faster and more efficient way to get a response.  Please allow 48 business hours for a response.  Please remember that this is for non-urgent requests.  _______________________________________________________  Your provider has requested that you go to the basement level for lab work before leaving today. Press "B" on the elevator. The lab is located at the first door on the left as you exit the elevator.  We have sent the following medications to your pharmacy for you to pick up at your convenience: Phenergan  Continue Protonix 2 times day  Do Miralax 17g daily  It has been recommended to you by your physician that you have a(n) EGD/Colon at [facility] completed. Please contact our office at [phone] by Mid February if we haven't contacted you.   You have been scheduled for an Upper GI Series and Small Bowel Follow Thru at [facility] Your appointment is on 11-27-2022 at 9am. Please arrive 30 minutes prior to your test for registration. Make certain not to have anything to eat or drink after midnight on the night before your test. If you need to reschedule, please contact radiology at  [phone]. --------------------------------------------------------------------------------------------------------------- An upper GI series uses x rays to help diagnose problems of the upper GI tract, which includes the esophagus, stomach, and duodenum. The duodenum is the first part of the small intestine. An upper GI series is conducted by a radiology technologist or a radiologist--a doctor who specializes in x-ray imaging--at a hospital or outpatient center. While sitting or standing in front of an x-ray machine, the patient drinks barium liquid, which is often white and has a chalky consistency and taste. The barium liquid coats the lining of the upper GI tract and makes signs of disease show up more clearly on x rays. X-ray video, called fluoroscopy, is used to view the barium liquid moving through the esophagus, stomach, and duodenum. Additional x rays and fluoroscopy are performed while the patient lies on an x-ray table. To fully coat the upper GI tract with barium liquid, the technologist or radiologist may press on the abdomen or ask the patient to change position. Patients hold still in various positions, allowing the technologist or radiologist to take x rays of the upper GI tract at different angles. If a technologist conducts the upper GI series, a radiologist will later examine the images to look for problems.  This test typically takes about 1 hour to complete --------------------------------------------------------------------------------------------------------------------------------------------- The Small Bowel Follow Thru examination is used to visualize the entire small bowel (intestines); specifically the connection between the small and large intestine. You will be positioned on a flat x-ray table and an image of your abdomen taken. Then the technologist will show the x-ray to the radiologist. The radiologist will instruct your technologist how  much (1-2 cups) barium sulfate you  will drink and when to begin taking the timed x-rays, usually 15-30 minutes after you begin drinking. Barium is a harmless substance that will highlight your small intestine by absorbing x-ray. The taste is chalky and it feels very heavy both in the cup and in your stomach.  After the first x-ray is taken and shown to the radiologist, he/she will determine when the next image is to be taken. This is repeated until the barium has reached the end of the small intestine and enters the beginning of the colon (cecum). At such time when the barium spills into the colon, you will be positioned on the x-ray table once again. The radiologist will use a fluoroscopic camera to take some detailed pictures of the connection between your small intestine and colon. The fluoroscope is an x-ray unit that works with a television/computer screen. The radiologist will apply pressure to your abdomen with his/her hand and a lead glove, a plastic paddle, or a paddle with an inflated rubber balloon on the end. This is to spread apart your loops of intestine so he/she can see all areas.   This test typically takes around 1 hour to complete.   Important  Drink plenty of water (8-10 cups/day) for a few days following the procedure to avoid constipation and blockage. The barium will make your stools white for a few days. --------------------------------------------------------------------------------------------------------------------------------------------

## 2022-11-25 ENCOUNTER — Encounter: Payer: Self-pay | Admitting: Gastroenterology

## 2022-11-25 ENCOUNTER — Encounter: Payer: Self-pay | Admitting: *Deleted

## 2022-11-25 ENCOUNTER — Other Ambulatory Visit: Payer: Self-pay

## 2022-11-25 DIAGNOSIS — R1013 Epigastric pain: Secondary | ICD-10-CM

## 2022-11-25 DIAGNOSIS — R748 Abnormal levels of other serum enzymes: Secondary | ICD-10-CM

## 2022-11-27 ENCOUNTER — Ambulatory Visit (HOSPITAL_COMMUNITY)
Admission: RE | Admit: 2022-11-27 | Discharge: 2022-11-27 | Disposition: A | Discharge status: Home / Self Care | Payer: Managed Care, Other (non HMO) | Source: Ambulatory Visit | Attending: Gastroenterology | Admitting: Gastroenterology

## 2022-11-27 DIAGNOSIS — K581 Irritable bowel syndrome with constipation: Secondary | ICD-10-CM | POA: Diagnosis present

## 2022-11-27 DIAGNOSIS — K219 Gastro-esophageal reflux disease without esophagitis: Secondary | ICD-10-CM | POA: Diagnosis present

## 2022-11-27 DIAGNOSIS — R1013 Epigastric pain: Secondary | ICD-10-CM | POA: Diagnosis present

## 2022-11-27 DIAGNOSIS — R1310 Dysphagia, unspecified: Secondary | ICD-10-CM | POA: Insufficient documentation

## 2022-11-27 DIAGNOSIS — R053 Chronic cough: Secondary | ICD-10-CM | POA: Diagnosis present

## 2022-11-27 DIAGNOSIS — Z800 Family history of malignant neoplasm of digestive organs: Secondary | ICD-10-CM | POA: Diagnosis present

## 2022-11-28 ENCOUNTER — Telehealth: Payer: Self-pay | Admitting: Gastroenterology

## 2022-11-28 NOTE — Telephone Encounter (Signed)
Case was denied by Carleene Overlie  We reviewed information from Dr. Jackquline Denmark, your benefit plan, and any policies and guidelines needed to reach this decision. We found the service requested is not medically necessary in your case: Based on [employer] Abdomen Imaging Guidelines Section(s): Epigastric Pain and Dyspepsia (AB 2.5) and Preface to the Imaging Guidelines, section Preface-3 Clinical Information, we cannot approve this request. Your healthcare provider told us that you have belly (abdomen) pain. The request cannot be approved because: ? Two sets of pictures (without a dye and then with a dye) are not needed to show your doctor the details needed to treat you. One picture study taken with a dye or without a dye is sufficient. ? A detailed picture study (computed tomography or CT [id number]) of your belly (abdomen) with pictures taken only after using a dye is more likely to show your doctor all they need to see in order to treat you. This study will be approved if ordered.  Please advise on next steps

## 2022-11-28 NOTE — Telephone Encounter (Signed)
Pease see notes below and advise

## 2022-11-29 ENCOUNTER — Encounter: Payer: Self-pay | Admitting: Allergy and Immunology

## 2022-11-29 ENCOUNTER — Encounter: Payer: Self-pay | Admitting: Gastroenterology

## 2022-12-01 NOTE — Progress Notes (Signed)
Please inform the patient. UGI with SB series-significantly dilated esophagus with delayed GE junction emptying.  I have reviewed the upper GI series.  Not typical for achalasia.  This is still in differential diagnoses.  She could also have narcotic induced significant esophageal dysmotility.  Rule out any masses  Plan: -Proceed with EGD/colonoscopy with 2-day prep as per last note -Note that she is also scheduled for CT Abdo.  Please also add pelvis to CT since she has significant constipation on upper GI series and lower Abdo pain. Send report to family physician

## 2022-12-01 NOTE — Telephone Encounter (Signed)
Just proceed with CT Abdo/pelvis with p.o. and IV contrast RG

## 2022-12-02 ENCOUNTER — Ambulatory Visit (HOSPITAL_COMMUNITY): Payer: Managed Care, Other (non HMO)

## 2022-12-02 ENCOUNTER — Other Ambulatory Visit: Payer: Self-pay

## 2022-12-02 DIAGNOSIS — Z800 Family history of malignant neoplasm of digestive organs: Secondary | ICD-10-CM

## 2022-12-02 DIAGNOSIS — R053 Chronic cough: Secondary | ICD-10-CM

## 2022-12-02 DIAGNOSIS — R1310 Dysphagia, unspecified: Secondary | ICD-10-CM

## 2022-12-02 DIAGNOSIS — K581 Irritable bowel syndrome with constipation: Secondary | ICD-10-CM

## 2022-12-02 DIAGNOSIS — R1013 Epigastric pain: Secondary | ICD-10-CM

## 2022-12-02 DIAGNOSIS — K219 Gastro-esophageal reflux disease without esophagitis: Secondary | ICD-10-CM

## 2022-12-02 DIAGNOSIS — R748 Abnormal levels of other serum enzymes: Secondary | ICD-10-CM

## 2022-12-02 NOTE — Telephone Encounter (Signed)
Hey Dr Lyndel Safe, the procedure [phone] is being denied by [employer], would you like me to setup a peer to peer, they are recommending [id number] Ct Abd W Contrast to be ordered instead. Thank you!

## 2022-12-02 NOTE — Telephone Encounter (Signed)
Pt made aware of Dr. Lyndel Safe recommendations:  Pt was scheduled for 12/09/2022 at 5:30 PM at [facility]. Pt to arrive at 5:00 PM. Nothing to eat or drink 4 hours prior:  Pt made aware: Pt verbalized understanding with all questions answered.

## 2022-12-03 ENCOUNTER — Encounter: Payer: Self-pay | Admitting: Allergy and Immunology

## 2022-12-03 NOTE — Addendum Note (Signed)
Addended by: Gillermina Hu on: 12/03/2022 09:47 AM   Modules accepted: Orders

## 2022-12-04 ENCOUNTER — Telehealth: Payer: Self-pay

## 2022-12-04 ENCOUNTER — Other Ambulatory Visit: Payer: Self-pay

## 2022-12-04 DIAGNOSIS — R1013 Epigastric pain: Secondary | ICD-10-CM

## 2022-12-04 DIAGNOSIS — K219 Gastro-esophageal reflux disease without esophagitis: Secondary | ICD-10-CM

## 2022-12-04 DIAGNOSIS — R1031 Right lower quadrant pain: Secondary | ICD-10-CM

## 2022-12-04 DIAGNOSIS — Z800 Family history of malignant neoplasm of digestive organs: Secondary | ICD-10-CM

## 2022-12-04 DIAGNOSIS — R748 Abnormal levels of other serum enzymes: Secondary | ICD-10-CM

## 2022-12-04 DIAGNOSIS — R1310 Dysphagia, unspecified: Secondary | ICD-10-CM

## 2022-12-04 DIAGNOSIS — K581 Irritable bowel syndrome with constipation: Secondary | ICD-10-CM

## 2022-12-04 NOTE — Telephone Encounter (Signed)
Submitted prior authorization for Pantoprazole on [email or web address]. Awaiting decision.

## 2022-12-04 NOTE — Telephone Encounter (Signed)
Spoke with pt over the phone: Pt notified to contact the provider who prescribe the medication for the PA. Pt verbalized understanding with all questions answered.   [facility] with number provide by pt. Spoke with Kristi at the Endo center. Steffanie Dunn stated to send cover sheet with pt information and request for the recent report. Request was faxed to [phone]

## 2022-12-05 ENCOUNTER — Other Ambulatory Visit: Payer: Self-pay

## 2022-12-05 MED ORDER — BUDESONIDE-FORMOTEROL FUMARATE 160-4.5 MCG/ACT IN AERO: INHALATION_SPRAY | RESPIRATORY_TRACT | 5 refills | Status: DC

## 2022-12-05 MED ORDER — PANTOPRAZOLE SODIUM 40 MG PO TBEC: 40.0000 mg | DELAYED_RELEASE_TABLET | Freq: Two times a day (BID) | ORAL | 5 refills | Status: DC

## 2022-12-05 NOTE — Telephone Encounter (Signed)
Please do CT what insurance recommends RG

## 2022-12-05 NOTE — Telephone Encounter (Signed)
Patient approved from 12/04/2022 to 12/04/2023; Case Id: [id number].  Will send new [place] to pharmacy with approval information. Will inform patient via [place].

## 2022-12-05 NOTE — Addendum Note (Signed)
Addended by: Zandra Abts on: 12/05/2022 03:59 PM   Modules accepted: Orders

## 2022-12-06 ENCOUNTER — Other Ambulatory Visit: Payer: Self-pay

## 2022-12-09 ENCOUNTER — Ambulatory Visit (HOSPITAL_COMMUNITY): Payer: Managed Care, Other (non HMO)

## 2022-12-09 NOTE — Telephone Encounter (Addendum)
Spoke with April McPeak and Ulyess Blossom through secure chat who stated that the CT scan as ordered has PA. Scan to be done at [facility]

## 2022-12-10 ENCOUNTER — Encounter: Payer: Self-pay | Admitting: Gastroenterology

## 2022-12-12 ENCOUNTER — Inpatient Hospital Stay: Admission: RE | Admit: 2022-12-12 | Payer: Managed Care, Other (non HMO) | Source: Ambulatory Visit

## 2022-12-18 ENCOUNTER — Ambulatory Visit: Payer: 59 | Admitting: Allergy and Immunology

## 2023-02-01 ENCOUNTER — Encounter: Payer: Self-pay | Admitting: Gastroenterology

## 2023-02-04 ENCOUNTER — Telehealth: Payer: Self-pay | Admitting: Gastroenterology

## 2023-02-04 NOTE — Telephone Encounter (Signed)
Inbound call from patient, states she is unsure why she has a two day prep instead of a one day prep. States she was speaking to her husband and he only had to prep for one day. Patient states she ate breakfast this morning because she forgot, her procedure is scheduled for 3/21. Please advise.

## 2023-02-04 NOTE — Telephone Encounter (Signed)
Pt stated that she did not follow her prep instructions clearly. Chart reviewed and confirmed that Dr. Lyndel Safe request her to have a 2 day prep.  After reviewing pt chart recent office visit states that pt is to have procedures done at [facility]. Pt was made aware that we will call her with an update on the date and time of the procedures.  Pt verbalized understanding with all questions answered.

## 2023-02-06 ENCOUNTER — Encounter: Payer: Managed Care, Other (non HMO) | Admitting: Gastroenterology

## 2023-02-10 IMAGING — XA DG TIBIA/FIBULA 2V*R*
1 series · 6 of 6 positions shown · non-contrast
Comparison: Ankle radiograph 02/19/2021

CLINICAL DATA: Right tibial nail.

EXAM:
RIGHT TIBIA AND FIBULA - 2 VIEW

[Series 1: unknown protocol · right · 0.30mm/px · 6 of 6 slices shown]
[im 1/6]
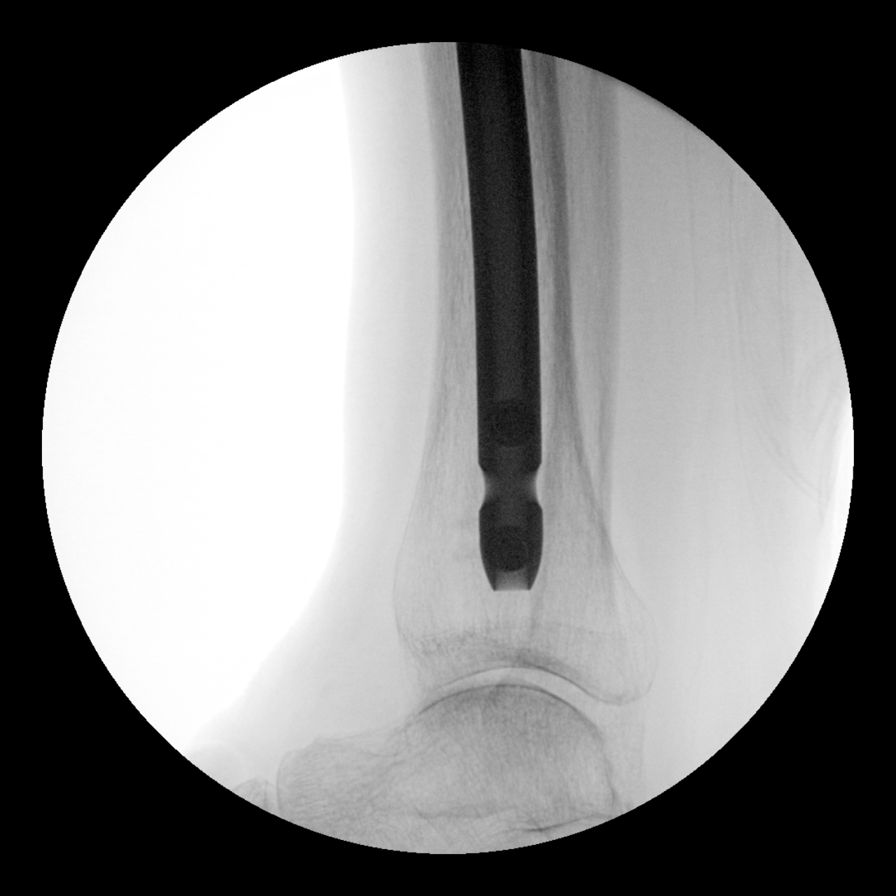
[im 2/6]
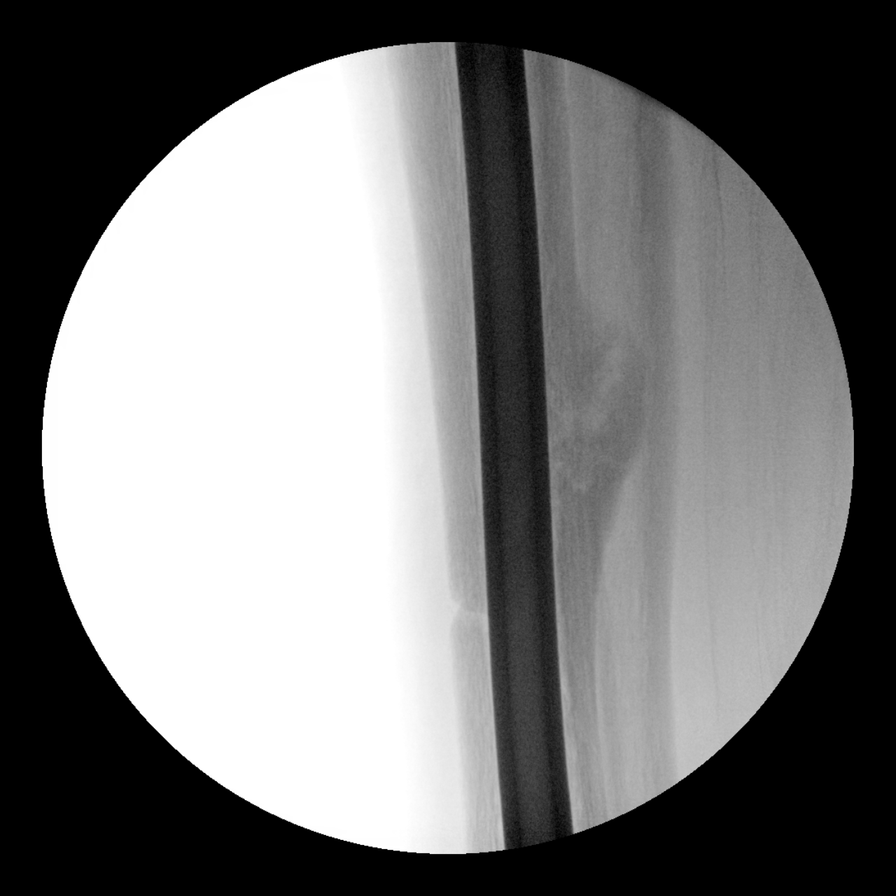
[im 3/6]
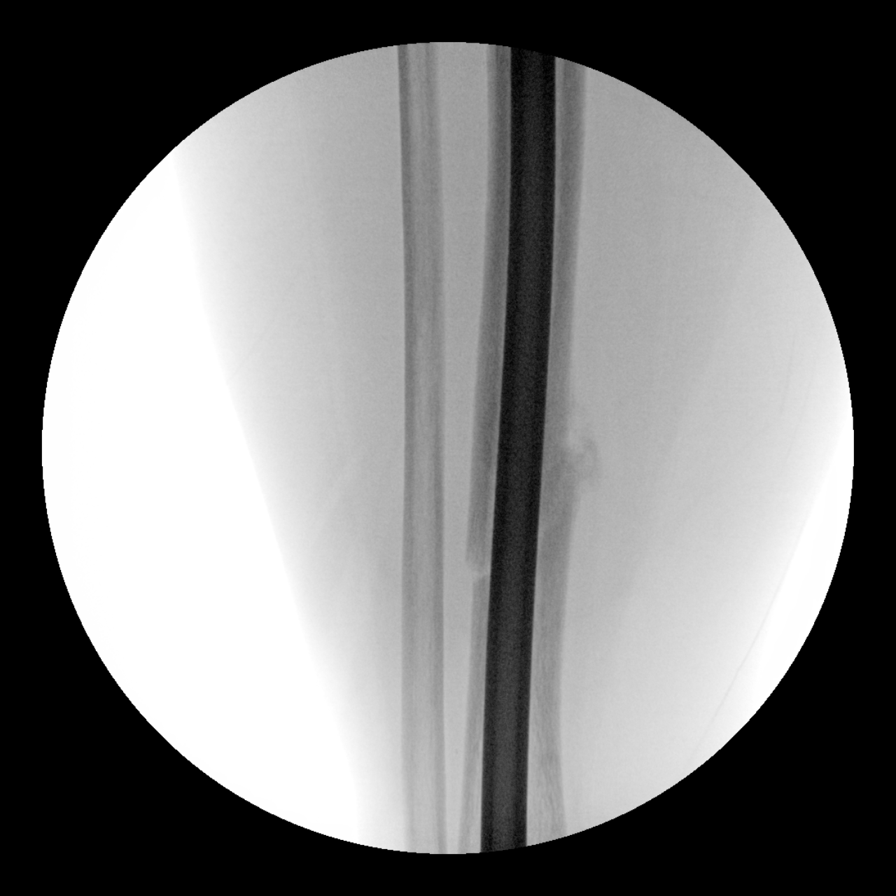
[im 4/6]
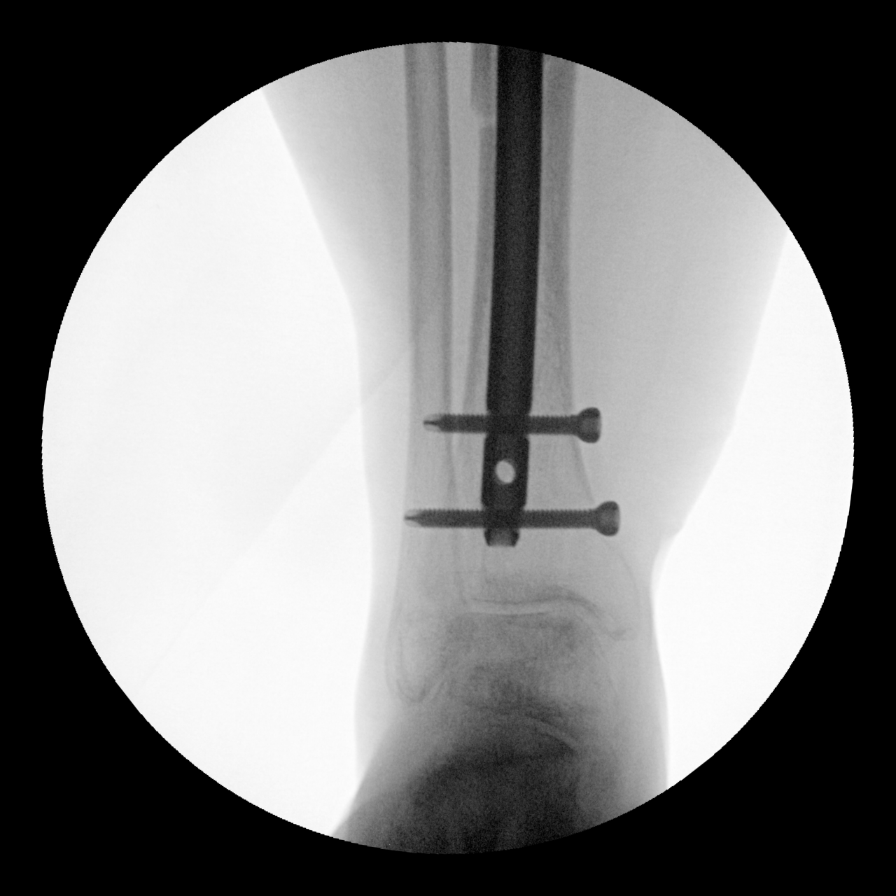
[im 5/6]
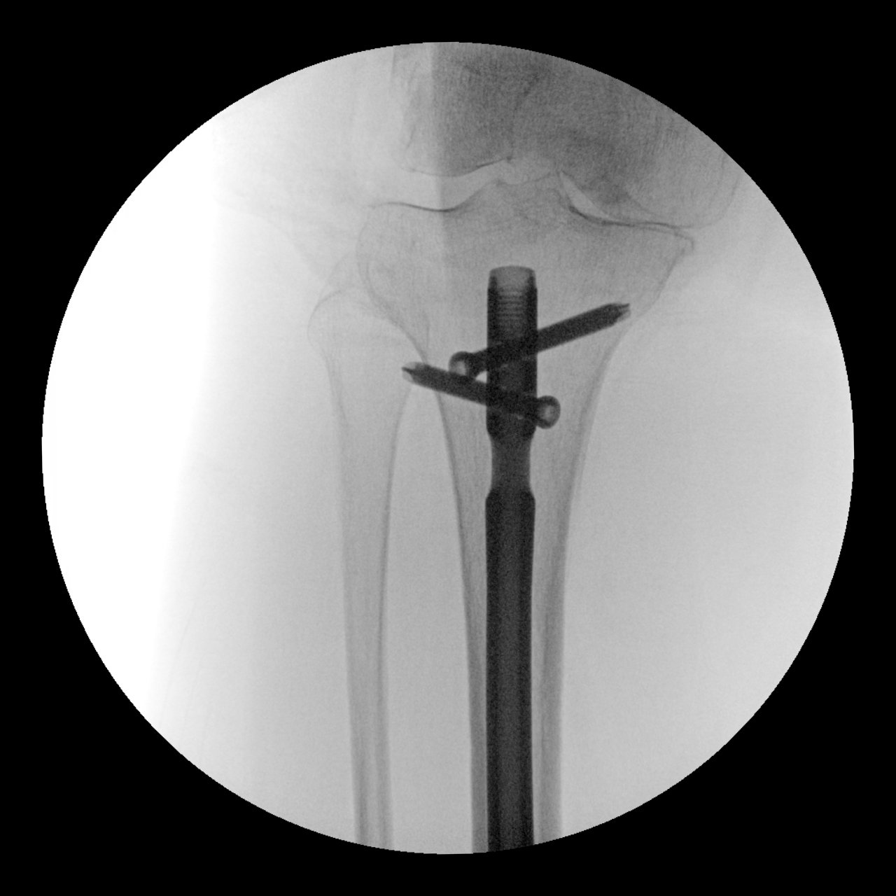
[im 6/6]
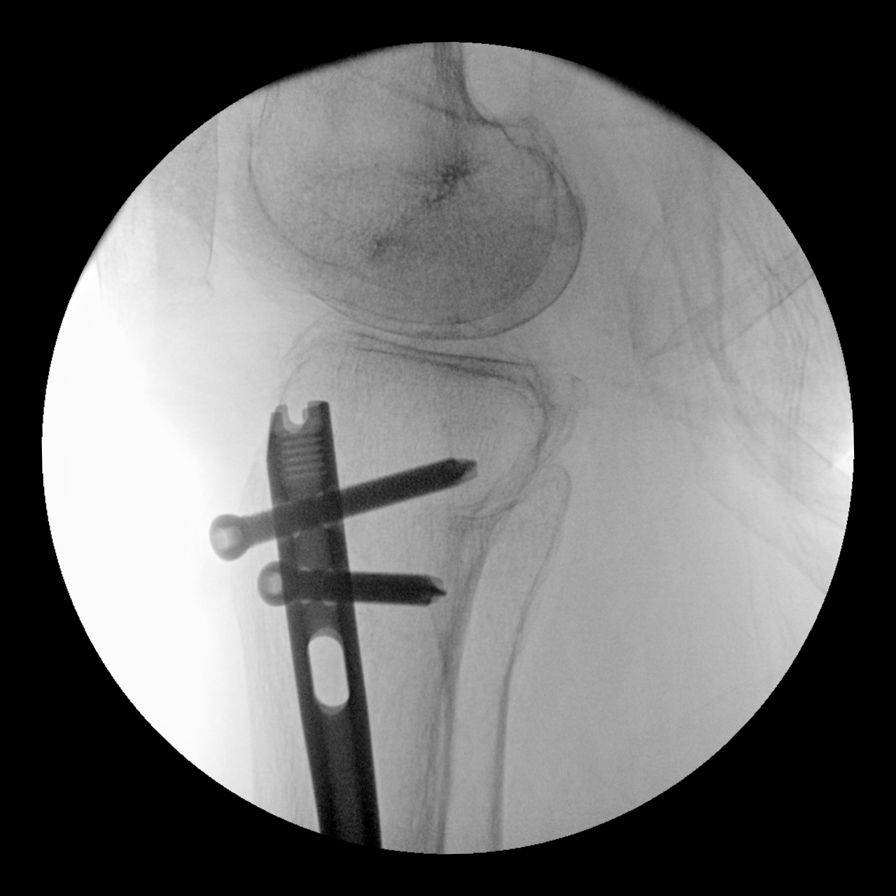

[6 of 6 positions shown; findings below may reference images not displayed]

FINDINGS: Six fluoroscopic spot views of the right tibia and fibula obtained
in the operating room. Intramedullary nail with distal and proximal
locking screw fixation traverse mid distal tibial shaft fracture.
Fluoroscopy time 1 minutes 6 seconds. Dose 3.83 mGy.
IMPRESSION: Fluoroscopic spot views during right tibial intramedullary nail
fixation.

## 2023-02-10 NOTE — Addendum Note (Signed)
Addended by: Curlene Labrum E on: 02/10/2023 05:02 PM   Modules accepted: Orders

## 2023-02-11 NOTE — Telephone Encounter (Signed)
Scheduled 7-1 at 1 with nurse. Patient was giving me a hard time about her instructions because I told her we needed to give new instructions to reflect her new appointment and location and she kept saying she got her instructions when I told her that the times will change and the NPO and medications will change as well too.

## 2023-03-08 ENCOUNTER — Encounter: Payer: Self-pay | Admitting: Gastroenterology

## 2023-03-24 NOTE — Telephone Encounter (Signed)
Audrey Cannon, Can she have OV with me- before endo procedures RG

## 2023-03-26 ENCOUNTER — Other Ambulatory Visit: Payer: Self-pay

## 2023-03-26 ENCOUNTER — Encounter: Payer: Self-pay | Admitting: Gastroenterology

## 2023-04-02 ENCOUNTER — Encounter: Payer: Self-pay | Admitting: Gastroenterology

## 2023-04-19 HISTORY — PX: SPINAL CORD STIMULATOR REMOVAL: SHX2423

## 2023-05-02 NOTE — Telephone Encounter (Signed)
Kaira, Can you please call her and see how she is doing If still with dysphagia, lets proceed with barium swallow with barium tablet Otherwise just OV RG

## 2023-05-05 NOTE — Telephone Encounter (Signed)
I have spoken to patient who states that her dysphagia has "not been too bad" recently. Therefore, she will plan to keep upcoming office visit in July with Dr Chales Abrahams.  In addition, patient states that she was recently placed on pain pills following a hospital visit and is very constipated. She states that she is taking stool softeners but is still having difficulty. I have advised that she may take miralax 1 capful once daily over the counter and can titrate as needed for soft bowel movements. Patient verbalizes understanding of this information.

## 2023-05-14 ENCOUNTER — Telehealth: Payer: Self-pay | Admitting: *Deleted

## 2023-05-14 NOTE — Telephone Encounter (Signed)
Audrey Cannon,  This pt has [address] Chiari formation which predisposes her to difficult intubation; her procedue will need to be done at the hospital

## 2023-05-19 ENCOUNTER — Ambulatory Visit (AMBULATORY_SURGERY_CENTER): Payer: Medicare Other

## 2023-05-19 VITALS — Ht 64.0 in | Wt 246.0 lb

## 2023-05-19 DIAGNOSIS — K219 Gastro-esophageal reflux disease without esophagitis: Secondary | ICD-10-CM

## 2023-05-19 DIAGNOSIS — Z1211 Encounter for screening for malignant neoplasm of colon: Secondary | ICD-10-CM

## 2023-05-19 DIAGNOSIS — R1310 Dysphagia, unspecified: Secondary | ICD-10-CM | POA: Insufficient documentation

## 2023-05-19 HISTORY — DX: Gastro-esophageal reflux disease without esophagitis: K21.9

## 2023-05-19 HISTORY — DX: Dysphagia, unspecified: R13.10

## 2023-05-19 NOTE — Progress Notes (Signed)

## 2023-05-28 ENCOUNTER — Other Ambulatory Visit (INDEPENDENT_AMBULATORY_CARE_PROVIDER_SITE_OTHER): Payer: Managed Care, Other (non HMO)

## 2023-05-28 ENCOUNTER — Ambulatory Visit (INDEPENDENT_AMBULATORY_CARE_PROVIDER_SITE_OTHER): Payer: Managed Care, Other (non HMO) | Admitting: Gastroenterology

## 2023-05-28 ENCOUNTER — Encounter: Payer: Self-pay | Admitting: Gastroenterology

## 2023-05-28 VITALS — BP 150/100 | HR 96 | Ht 64.0 in | Wt 245.0 lb

## 2023-05-28 DIAGNOSIS — R748 Abnormal levels of other serum enzymes: Secondary | ICD-10-CM

## 2023-05-28 DIAGNOSIS — K219 Gastro-esophageal reflux disease without esophagitis: Secondary | ICD-10-CM

## 2023-05-28 DIAGNOSIS — Z800 Family history of malignant neoplasm of digestive organs: Secondary | ICD-10-CM

## 2023-05-28 DIAGNOSIS — K581 Irritable bowel syndrome with constipation: Secondary | ICD-10-CM | POA: Diagnosis not present

## 2023-05-28 DIAGNOSIS — R1013 Epigastric pain: Secondary | ICD-10-CM

## 2023-05-28 DIAGNOSIS — R053 Chronic cough: Secondary | ICD-10-CM

## 2023-05-28 DIAGNOSIS — R1310 Dysphagia, unspecified: Secondary | ICD-10-CM

## 2023-05-28 DIAGNOSIS — R9389 Abnormal findings on diagnostic imaging of other specified body structures: Secondary | ICD-10-CM

## 2023-05-28 LAB — COMPREHENSIVE METABOLIC PANEL
ALT: 10 U/L (ref 0–35)
AST: 17 U/L (ref 0–37)
Albumin: 4.3 g/dL (ref 3.5–5.2)
Alkaline Phosphatase: 115 U/L (ref 39–117)
BUN: 14 mg/dL (ref 6–23)
CO2: 27 mEq/L (ref 19–32)
Calcium: 10.1 mg/dL (ref 8.4–10.5)
Chloride: 98 mEq/L (ref 96–112)
Creatinine, Ser: 0.83 mg/dL (ref 0.40–1.20)
GFR: 78.77 mL/min (ref >60.00)
Glucose, Bld: 110 mg/dL — ABNORMAL HIGH (ref 70–99)
Potassium: 4.8 mEq/L (ref 3.5–5.1)
Sodium: 134 mEq/L — ABNORMAL LOW (ref 135–145)
Total Bilirubin: 0.2 mg/dL (ref 0.2–1.2)
Total Protein: 6.9 g/dL (ref 6.0–8.3)

## 2023-05-28 LAB — CBC WITH DIFFERENTIAL/PLATELET
Basophils Absolute: 0.1 10*3/uL (ref 0.0–0.1)
Basophils Relative: 0.5 % (ref 0.0–3.0)
Eosinophils Absolute: 0.4 10*3/uL (ref 0.0–0.7)
Eosinophils Relative: 3.7 % (ref 0.0–5.0)
HCT: 44.3 % (ref 36.0–46.0)
Hemoglobin: 14.7 g/dL (ref 12.0–15.0)
Lymphocytes Relative: 38.7 % (ref 12.0–46.0)
Lymphs Abs: 3.9 10*3/uL (ref 0.7–4.0)
MCHC: 33.2 g/dL (ref 30.0–36.0)
MCV: 91.4 fl (ref 78.0–100.0)
Monocytes Absolute: 1.5 10*3/uL — ABNORMAL HIGH (ref 0.1–1.0)
Monocytes Relative: 15.2 % — ABNORMAL HIGH (ref 3.0–12.0)
Neutro Abs: 4.2 10*3/uL (ref 1.4–7.7)
Neutrophils Relative %: 41.9 % — ABNORMAL LOW (ref 43.0–77.0)
Platelets: 376 10*3/uL (ref 150.0–400.0)
RBC: 4.85 Mil/uL (ref 3.87–5.11)
RDW: 12.9 % (ref 11.5–15.5)
WBC: 10 10*3/uL (ref 4.0–10.5)

## 2023-05-28 LAB — LIPASE: Lipase: 5 U/L — ABNORMAL LOW (ref 11.0–59.0)

## 2023-05-28 LAB — GAMMA GT: GGT: 127 U/L — ABNORMAL HIGH (ref 7–51)

## 2023-05-28 MED ORDER — PANTOPRAZOLE SODIUM 40 MG PO TBEC: 40.0000 mg | DELAYED_RELEASE_TABLET | Freq: Two times a day (BID) | ORAL | 4 refills | Status: DC

## 2023-05-28 MED ORDER — DICYCLOMINE HCL 10 MG PO CAPS: 10.0000 mg | ORAL_CAPSULE | Freq: Three times a day (TID) | ORAL | 4 refills | Status: AC

## 2023-05-28 MED ORDER — PROMETHAZINE HCL 25 MG PO TABS: 25.0000 mg | ORAL_TABLET | Freq: Four times a day (QID) | ORAL | 2 refills | Status: AC | PRN

## 2023-05-28 MED ORDER — METOCLOPRAMIDE HCL 10 MG PO TABS: 10.0000 mg | ORAL_TABLET | Freq: Every day | ORAL | 3 refills | Status: DC | PRN

## 2023-05-28 MED ORDER — ESOMEPRAZOLE MAGNESIUM 20 MG PO CPDR: 20.0000 mg | DELAYED_RELEASE_CAPSULE | Freq: Two times a day (BID) | ORAL | 4 refills | Status: DC
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
## 2023-05-28 NOTE — Patient Instructions (Addendum)
_______________________________________________________  If your blood pressure at your visit was 140/90 or greater, please contact your primary care physician to follow up on this.  _______________________________________________________  If you are age 56 or older, your body mass index should be between 23-30. Your Body mass index is 42.05 kg/m. If this is out of the aforementioned range listed, please consider follow up with your Primary Care Provider.  If you are age 64 or younger, your body mass index should be between 19-25. Your Body mass index is 42.05 kg/m. If this is out of the aformentioned range listed, please consider follow up with your Primary Care Provider.   ________________________________________________________  The [facility] providers would like to encourage you to use [facility] to communicate with providers for non-urgent requests or questions.  Due to long hold times on the telephone, sending your provider a message by [facility] may be a faster and more efficient way to get a response.  Please allow 48 business hours for a response.  Please remember that this is for non-urgent requests.  _______________________________________________________  Your provider has requested that you go to the basement level for lab work before leaving today. Press "B" on the elevator. The lab is located at the first door on the left as you exit the elevator.  We have sent the following medications to your pharmacy for you to pick up at your convenience: Phenergan 25mg  4 times a day as needed Bentyl 10mg  4 times a day as needed Reglan 10mg  daily as needed  Continue Protonix 40mg  2 times a day and nexium 20mg  2 times a day  You have been scheduled for an endoscopy and colonoscopy. Please follow the written instructions given to you at your visit today.  Please pick up your prep supplies at the pharmacy within the next 1-3 days.  If you use inhalers (even only as needed), please bring them  with you on the day of your procedure.  DO NOT TAKE 7 DAYS PRIOR TO TEST- Trulicity (dulaglutide) Ozempic, Wegovy (semaglutide) Mounjaro (tirzepatide) Bydureon Bcise (exanatide extended release)  DO NOT TAKE 1 DAY PRIOR TO YOUR TEST Rybelsus (semaglutide) Adlyxin (lixisenatide) Victoza (liraglutide) Byetta (exanatide) ___________________________________________________________________________  Thank you,  Dr. Lynann Bologna

## 2023-06-02 ENCOUNTER — Telehealth: Payer: Self-pay

## 2023-06-02 ENCOUNTER — Encounter: Payer: Self-pay | Admitting: Gastroenterology

## 2023-06-02 NOTE — Telephone Encounter (Signed)
-----   Message from Lynann Bologna sent at 05/30/2023 10:13 PM EDT ----- Please inform the patient. All results normal or at baseline. GGT is mildly elevated I do not think she is drinking any alcohol.  If she is, stop Need to reduce weight. Send report to family physician

## 2023-06-02 NOTE — Telephone Encounter (Signed)
Called patient regarding results and she said that she haven't dranked alcohol in years and that she hasn't taken more than 3 bites a day because her stomach is messed up and you know this. Please advise

## 2023-06-04 NOTE — Telephone Encounter (Signed)
Plan as per last note Proceed with EGD/colon at [facility] (per Jonny Ruiz, not a candidate for [facility] d/t Arnold-Chiari malformation and multiple comorbidities)  RG

## 2023-06-04 NOTE — Telephone Encounter (Signed)
GGT likely elevated due to fatty liver. No ETOH Patient having significant GI problems including abdominal pain/cramps   Plan: -Proceed with CT Abdo/pelvis with contrast ASAP.  -Proceed with EGD/colon as scheduled  RG

## 2023-06-05 ENCOUNTER — Encounter (HOSPITAL_COMMUNITY): Payer: Self-pay | Admitting: Gastroenterology

## 2023-06-05 NOTE — Telephone Encounter (Signed)
See patient message. Already scheduled 7-30 for procedure

## 2023-06-05 NOTE — Progress Notes (Signed)
Attempted to obtain medical history via telephone, unable to reach at this time. HIPAA compliant voicemail message left requesting return call to pre surgical testing department. 

## 2023-06-09 NOTE — Telephone Encounter (Signed)
Called patient and she said that she has been taking medication for her hair and she researched it and it says that it could affect her liver so she said that this was something that cause the change in her lab work so she stopped taking it. So she is fine now

## 2023-06-17 ENCOUNTER — Encounter (HOSPITAL_COMMUNITY): Admission: RE | Payer: Self-pay | Source: Home / Self Care

## 2023-06-17 ENCOUNTER — Ambulatory Visit (HOSPITAL_COMMUNITY)
Admission: RE | Admit: 2023-06-17 | Payer: Managed Care, Other (non HMO) | Source: Home / Self Care | Admitting: Gastroenterology

## 2023-06-17 SURGERY — ESOPHAGOGASTRODUODENOSCOPY (EGD) WITH PROPOFOL
Anesthesia: Monitor Anesthesia Care

## 2023-06-17 NOTE — Progress Notes (Signed)
Patient called this am and stated that she was going to just have an egd and not finish the colon prep for her procedure. Patient stated she drank too much of the prep and now started going to the bathroom too much and can not drive here. Patient cancelling appointment and will call office to reschedule. Dr. Chales Abrahams made aware.Marland Kitchen

## 2023-06-19 DIAGNOSIS — Z01818 Encounter for other preprocedural examination: Secondary | ICD-10-CM | POA: Insufficient documentation

## 2023-06-20 ENCOUNTER — Encounter: Payer: Self-pay | Admitting: Gastroenterology

## 2023-06-27 NOTE — Telephone Encounter (Signed)
Lets proceed with EGD only at [facility] for now RG

## 2023-07-02 ENCOUNTER — Other Ambulatory Visit: Payer: Self-pay

## 2023-07-02 DIAGNOSIS — R1310 Dysphagia, unspecified: Secondary | ICD-10-CM

## 2023-07-02 DIAGNOSIS — R1013 Epigastric pain: Secondary | ICD-10-CM

## 2023-07-02 DIAGNOSIS — K219 Gastro-esophageal reflux disease without esophagitis: Secondary | ICD-10-CM

## 2023-07-02 DIAGNOSIS — R053 Chronic cough: Secondary | ICD-10-CM

## 2023-07-02 NOTE — Telephone Encounter (Signed)
Left message for pt to call back  °

## 2023-07-02 NOTE — Telephone Encounter (Signed)
Pt made aware of Dr. Chales Abrahams recommendations: Pt was scheduled for an EGD at [facility] on 08/28/2023 at 10:15 AM. Pt made aware. Case ID [id number] Ambulatory referral to GI placed in Epic. Pt made aware. Pt not on blood thinner, not diabetic. Pt stated that she is on Phentermine. Pt notified to hold for 10 days prior to procedure. Prep instructions were sent to pt via my chart. Pt made aware. Pt verbalized understanding with all questions answered.

## 2023-07-10 ENCOUNTER — Other Ambulatory Visit: Payer: Self-pay

## 2023-07-10 DIAGNOSIS — I10 Essential (primary) hypertension: Secondary | ICD-10-CM | POA: Insufficient documentation

## 2023-07-10 DIAGNOSIS — Z9889 Other specified postprocedural states: Secondary | ICD-10-CM | POA: Insufficient documentation

## 2023-07-10 DIAGNOSIS — T8859XA Other complications of anesthesia, initial encounter: Secondary | ICD-10-CM | POA: Insufficient documentation

## 2023-07-11 ENCOUNTER — Ambulatory Visit: Payer: Medicare Other | Admitting: Cardiology

## 2023-07-16 ENCOUNTER — Encounter: Payer: Self-pay | Admitting: Cardiology

## 2023-07-16 ENCOUNTER — Ambulatory Visit: Payer: Managed Care, Other (non HMO) | Attending: Cardiology | Admitting: Cardiology

## 2023-07-16 VITALS — BP 120/66 | HR 84 | Ht 64.0 in | Wt 237.0 lb

## 2023-07-16 DIAGNOSIS — R0609 Other forms of dyspnea: Secondary | ICD-10-CM

## 2023-07-16 DIAGNOSIS — Z01810 Encounter for preprocedural cardiovascular examination: Secondary | ICD-10-CM | POA: Diagnosis not present

## 2023-07-16 DIAGNOSIS — E782 Mixed hyperlipidemia: Secondary | ICD-10-CM

## 2023-07-16 DIAGNOSIS — I10 Essential (primary) hypertension: Secondary | ICD-10-CM

## 2023-07-16 DIAGNOSIS — I351 Nonrheumatic aortic (valve) insufficiency: Secondary | ICD-10-CM | POA: Insufficient documentation

## 2023-07-16 MED ORDER — METOPROLOL TARTRATE 100 MG PO TABS: 100.0000 mg | ORAL_TABLET | Freq: Once | ORAL | 0 refills | Status: DC

## 2023-07-16 MED ORDER — METOLAZONE 2.5 MG PO TABS: 2.5000 mg | ORAL_TABLET | Freq: Every day | ORAL | 3 refills | Status: DC

## 2023-07-16 NOTE — Progress Notes (Addendum)
Cardiology Office Note:    Date:  07/16/2023   ID:  Audrey Cannon, DOB [birth year], MRN [id number]  PCP:  Casper Harrison, NP  Cardiologist:  Garwin Brothers, MD   Referring MD: Francee Piccolo I, NP    ASSESSMENT:    1. Preop cardiovascular exam   2. Primary hypertension   3. Morbid obesity (HCC)   4. Mixed dyslipidemia    PLAN:    In order of problems listed above:  Primary prevention stressed with the patient.  Importance of compliance with diet medication stressed and patient verbalized standing. Essential hypertension: Blood pressure stable and diet was emphasized.  Lifestyle modification urged. Dyspnea on exertion: These are concerning symptoms.  Also in view of aortic aneurysm repair and aortic regurgitation: I recommended CT coronary angiography with FFR.  She is agreeable.  If this is unremarkable then she could proceed with the surgery at low risk. Mild aortic regurgitation: Stable at this time.  Echocardiogram will be done to assess this. Pedal edema: This is bothering her.  She ambulates with a walker.  I will switch her medication from losartan to Zaroxolyn at this time.  She will be back in 1 week for a pulse blood pressure check and a Chem-7. Patient will be seen in follow-up appointment in 6 months or earlier if the patient has any concerns.   Addendum 08/15/2023: Patient CT scan was unremarkable.  Echocardiogram was unremarkable except moderate aortic regurgitation.  Based on these patient is not at high risk for coronary events during the aforementioned surgery.  Medical hemodynamic monitoring will further reduce the risk of coronary events.    Medication Adjustments/Labs and Tests Ordered: Current medicines are reviewed at length with the patient today.  Concerns regarding medicines are outlined above.  Orders Placed This Encounter  Procedures   EKG 12-Lead   No orders of the defined types were placed in this encounter.    History of Present Illness:     Audrey Cannon is a 57 y.o. female who is being seen today for the evaluation of preoperative cardiovascular evaluation and dyspnea on exertion at the request of Francee Piccolo I, NP.  Patient is a pleasant 57 year old female.  She has a past medical history of essential hypertension, mixed dyslipidemia, morbid obesity.  She has had aortic aneurysm repaired.  She has mild aortic regurgitation.  Her aortic valve was not changed.  She denies any chest pain orthopnea or PND.  She gives history of dyspnea on exertion.  She gives history of significant pedal edema.  At the time of my evaluation, the patient is alert awake oriented and in no distress.  She is planning to undergo knee replacement surgery and here for evaluation.  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 DAVID DOMINGUEZ, NP [phone]) [place] [facility] of this note might be different from the original. 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 C5-6 fusion 10/2010 [facility];(hx [facility] BOTOX INJECTIONS, CHIROPRACTER, MASSAGE [place]) Formatting of this note might be different from the original. C5-6 fusion 10/2010 [facility];(hx [facility] BOTOX INJECTIONS, CHIROPRACTER, MASSAGE [place])   Dysphagia 05/19/2023    Elevated white blood cell count, unspecified 07/16/2016   Fibromyalgia 06/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 colonoscopy 2007 negative, repeat Nov 2011 negative (fam hx, repeat 2016)   High risk medication use 04/18/2022   High risk medications (not anticoagulants) long-term use 05/08/2011   Formatting of this note might be different from the original. [facility] (contract scanned into [employer]);#120 Oxycodone/month;Most recent UDS: 04/2011 (appropriate) Formatting of this note might be different from the original. [facility] (contract scanned into [employer]CIS);#120 Oxycodone/month;Most recent UDS: 04/2011 (appropriate)   Hypertension    Hyponatremia 01/15/2019   Hypothyroidism 09/10/2011   MDD (major depressive disorder), recurrent episode, severe (HCC) 03/16/2019   Migraine 06/26/2009   Mixed dyslipidemia 03/17/2020   Morbid obesity (HCC) 04/19/2022   Opioid-induced hyperalgesia 04/18/2022   Oral aphthae 08/15/2011   Polyarthralgia 12/23/2018   PONV (postoperative nausea and vomiting)    Post concussion syndrome 03/16/2019   Post traumatic stress disorder (PTSD) 03/16/2019   Preoperative clearance 06/19/2023   Pyelonephritis 04/18/2022   Recurrent major depressive disorder, in partial remission (HCC) 09/01/2017   Rheumatoid arthritis (HCC) 04/18/2022   States associated with artificial menopause 06/26/2009   TBI (traumatic brain injury) (HCC) 11/01/2015   Thoracic aortic aneurysm without rupture (HCC) 09/01/2017   Formatting of this note might be different from the  original. H/o repair 2/15 outside records indicate moderate aortic regurgitation and aneurysm measuring 4.6 cm by CT.  Patient had significant dyspnea on exertion and had replacement of the ascending aorta with 26 Hemashield graft.  The aortic valve was "healthy "with minor calcification small median raphae with slight right-left cusp fusion but    Unspecified osteoarthritis, unspecified site 06/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 R knee microfracture surgery/lateral patellar release 01/2013 [facility]   Upper GI bleeding 06/26/2009    Past Surgical History:  Procedure Laterality Date   APPENDECTOMY  1993   WITH HYSTERECTOMY   CARDIAC VALVE SURGERY  2015   Ascending aortic anuerysm   Discectomy Fusion C3/4  03/2010   ESOPHAGOGASTRODUODENOSCOPY  09/03/2016   [facility].   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   amphetamine-dextroamphetamine (ADDERALL) 30 MG tablet Take 15-30 mg by mouth See admin instructions. 30 mg in the morning,  15 mg at Noon   cetirizine (ZYRTEC) 10 MG tablet Take 10 mg by mouth 2 (two) times daily.   cloNIDine (CATAPRES) 0.1 MG tablet Take 0.1 mg by mouth 2 (two) times daily. 1600 and at Bedtime   cyclobenzaprine (FLEXERIL) 10 MG tablet Take 10 mg by mouth 3 (three) times daily as needed for muscle spasms.   dicyclomine (BENTYL) 10 MG capsule Take 1 capsule (10 mg total) by mouth 4 (four) times daily -  before meals and at bedtime. (Patient  taking differently: Take 10 mg by mouth as needed for spasms.)   Doxepin HCl 6 MG TABS Take 12 mg by mouth at bedtime.   esomeprazole (NEXIUM) 20 MG capsule Take 1 capsule (20 mg total) by mouth 2 (two) times daily.   gabapentin (NEURONTIN) 300 MG capsule Take 900 mg by mouth at bedtime.   HUMIRA PEN 40 MG/0.4ML PNKT Inject 40 mg into the skin every 14 (fourteen) days.   hydrOXYzine (VISTARIL) 50 MG capsule Take 1 capsule by mouth 2 (two) times daily. Morning, and 1600   Ketorolac Tromethamine (SPRIX) 15.75 MG/SPRAY SOLN Place 1 spray into the nose as needed (pain).   lamoTRIgine (LAMICTAL) 200 MG tablet Take 200 mg by mouth 2 (two) times daily.   lidocaine (LIDODERM) 5 % Place 1 patch onto the skin daily as needed (pain).   losartan (COZAAR) 50 MG tablet Take 50 mg by mouth daily.   metoCLOPramide (REGLAN) 10 MG tablet Take 1 tablet (10 mg total) by mouth daily as needed for nausea.   ondansetron (ZOFRAN) 8 MG tablet Take 8 mg by mouth every 8 (eight) hours as needed for nausea or vomiting.   Oxcarbazepine (TRILEPTAL) 300 MG tablet Take 300 mg by mouth 2 (two) times daily.   Oxycodone HCl 10 MG TABS Take 10 mg by mouth 5 (five) times daily as needed (Pain).   pantoprazole (PROTONIX) 40 MG tablet Take 1 tablet (40 mg total) by mouth 2 (two) times daily.   phentermine (ADIPEX-P) 37.5 MG tablet Take 37.5 mg by mouth daily.   promethazine (PHENERGAN) 25 MG tablet Take 1 tablet (25 mg total) by mouth every 6 (six) hours as needed for nausea or vomiting.   traZODone (DESYREL) 150 MG tablet Take 150 mg by mouth at bedtime.   Vitamin D, Ergocalciferol, (DRISDOL) 1.25 MG (50000 UNIT) CAPS capsule Take 50,000 Units by mouth once a week.   XTAMPZA ER 36 MG C12A Take 36 mg by mouth in the morning and at bedtime.     Allergies:   Celebrex [celecoxib], Hydrochlorothiazide, Cefpodoxime, Cefuroxime, Cymbalta [duloxetine hcl], and Serotonin reuptake inhibitors (ssris)   Social History   Socioeconomic  History   Marital status: Married    Spouse name: Not on file   Number of children: 2   Years of education: Not on file   Highest education level: Not on file  Occupational History   Occupation: retired Runner, broadcasting/film/video  Tobacco Use   Smoking status: Never   Smokeless tobacco: Never  Vaping Use   Vaping status: Never Used  Substance and Sexual Activity   Alcohol use: Never   Drug use: Never   Sexual activity: Not on file  Other Topics Concern   Not on file  Social History Narrative   Not on file   Social Determinants of Health   Financial Resource Strain: Not on file  Food Insecurity: Low Risk  (06/18/2023)   Received from [facility]   Food vital sign  Within the past 12 months, you worried that your food would run out before you got money to buy more: Never true    Within the past 12 months, the food you bought just didn't last and you didn't have money to get more. : Never true  Transportation Needs: Not on file (06/18/2023)  Physical Activity: Not on file  Stress: Not on file  Social Connections: Not on file     Family History: The patient's family history includes Arthritis in her father; Cancer in her father; Colon cancer in her mother; Hypertension in her mother; Seizures in her sister; Stroke in her mother. There is no history of Esophageal cancer, Liver disease, Colon polyps, Rectal cancer, or Stomach cancer.  ROS:   Please see the history of present illness.    All other systems reviewed and are negative.  EKGs/Labs/Other Studies Reviewed:    The following studies were reviewed today:  EKG Interpretation Date/Time:  Wednesday July 16 2023 09:47:22 EDT Ventricular Rate:  84 PR Interval:  190 QRS Duration:  122 QT Interval:  382 QTC Calculation: 451 R Axis:   -35  Text Interpretation: Normal sinus rhythm Left axis deviation Left ventricular hypertrophy with QRS widening ( R in aVL , Cornell product ) T wave abnormality, consider lateral ischemia When  compared with ECG of 13-Jul-2021 08:55, Nonspecific T wave abnormality now evident in Inferior leads T wave inversion more evident in Lateral leads Confirmed by Belva Crome [phone]) on 07/16/2023 10:07:19 AM     Recent Labs: 05/28/2023: ALT 10; BUN 14; Creatinine, Ser 0.83; Hemoglobin 14.7; Platelets 376.0; Potassium 4.8; Sodium 134  Recent Lipid Panel No results found for: "CHOL", "TRIG", "HDL", "CHOLHDL", "VLDL", "LDLCALC", "LDLDIRECT"  Physical Exam:    VS:  BP 120/66   Pulse 84   Ht 5\' 4"  (1.626 m)   Wt 237 lb 0.6 oz (107.5 kg)   SpO2 93%   BMI 40.69 kg/m     Wt Readings from Last 3 Encounters:  07/16/23 237 lb 0.6 oz (107.5 kg)  05/28/23 245 lb (111.1 kg)  05/19/23 246 lb (111.6 kg)     GEN: Patient is in no acute distress HEENT: Normal NECK: No JVD; No carotid bruits LYMPHATICS: No lymphadenopathy CARDIAC: S1 S2 regular, 2/6 systolic murmur at the apex. RESPIRATORY:  Clear to auscultation without rales, wheezing or rhonchi  ABDOMEN: Soft, non-tender, non-distended MUSCULOSKELETAL:  No edema; No deformity  SKIN: Warm and dry NEUROLOGIC:  Alert and oriented x 3 PSYCHIATRIC:  Normal affect    Signed, Garwin Brothers, MD  07/16/2023 10:30 AM    [facility]

## 2023-07-16 NOTE — Patient Instructions (Addendum)
Please keep a BP log for 2 weeks and send by MyChart or mail.  Blood Pressure Record Sheet To take your blood pressure, you will need a blood pressure machine. You can buy a blood pressure machine (blood pressure monitor) at your clinic, drug store, or online. When choosing one, consider: An automatic monitor that has an arm cuff. A cuff that wraps snugly around your upper arm. You should be able to fit only one finger between your arm and the cuff. A device that stores blood pressure reading results. Do not choose a monitor that measures your blood pressure from your wrist or finger. Follow your health care provider's instructions for how to take your blood pressure. To use this form: Get one reading in the morning (a.m.) 1-2 hours after you take any medicines. Get one reading in the evening (p.m.) before supper.   Blood pressure log Date: _______________________  a.m. _____________________(1st reading) HR___________            p.m. _____________________(2nd reading) HR__________  Date: _______________________  a.m. _____________________(1st reading) HR___________            p.m. _____________________(2nd reading) HR__________  Date: _______________________  a.m. _____________________(1st reading) HR___________            p.m. _____________________(2nd reading) HR__________  Date: _______________________  a.m. _____________________(1st reading) HR___________            p.m. _____________________(2nd reading) HR__________  Date: _______________________  a.m. _____________________(1st reading) HR___________            p.m. _____________________(2nd reading) HR__________  Date: _______________________  a.m. _____________________(1st reading) HR___________            p.m. _____________________(2nd reading) HR__________  Date: _______________________  a.m. _____________________(1st reading) HR___________            p.m. _____________________(2nd reading)  HR__________   This information is not intended to replace advice given to you by your health care provider. Make sure you discuss any questions you have with your health care provider. Document Revised: 02/23/2020 Document Reviewed: 02/23/2020 Elsevier Patient Education  2021 Elsevier Inc.   Medication Instructions:  Your physician has recommended you make the following change in your medication:   Stop Cozaar  Start Zaroxolyn 2.5 mg daily  *If you need a refill on your cardiac medications before your next appointment, please call your pharmacy*   Lab Work: Your physician recommends that you have a BMP today in the office.  Your physician recommends that you return for lab work in: 1 week for nurse visit and labs. [facility] lab is located on the 3rd floor, [address]. Hours are Monday - Friday 8 am to 4 pm, closed 11:30 am to 1:00 pm. You do NOT need an appointment.    If you have labs (blood work) drawn today and your tests are completely normal, you will receive your results only by: MyChart Message (if you have MyChart) OR A paper copy in the mail If you have any lab test that is abnormal or we need to change your treatment, we will call you to review the results.   Testing/Procedures:   Your cardiac CT will be scheduled at one of the below locations:   [facility] [address] [place], [place] [address] [phone]  If scheduled at [facility], please arrive at the [facility] (Entrance C2) of [facility] 30 minutes prior to test start time. You can use the FREE valet parking offered at entrance C (encouraged  to control the heart rate for the test)  Proceed to the [facility] Radiology Department (first floor) to check-in and test prep.  All radiology patients and guests should use entrance C2 at [facility], accessed from [facility], even though the hospital's physical address listed is [address]     Please follow these instructions carefully (unless otherwise directed):  On the Night Before the Test: Be sure to Drink plenty of water. Do not consume any caffeinated/decaffeinated beverages or chocolate 12 hours prior to your test. Do not take any antihistamines 12 hours prior to your test.  On the Day of the Test: Drink plenty of water until 1 hour prior to the test. Do not eat any food 1 hour prior to test. You may take your regular medications prior to the test.  Take metoprolol (Lopressor) two hours prior to test. This is a one time dose. FEMALES- please wear underwire-free bra if available, avoid dresses & tight clothing  After the Test: Drink plenty of water. After receiving IV contrast, you may experience a mild flushed feeling. This is normal. On occasion, you may experience a mild rash up to 24 hours after the test. This is not dangerous. If this occurs, you can take Benadryl 25 mg and increase your fluid intake. If you experience trouble breathing, this can be serious. If it is severe call 911 IMMEDIATELY. If it is mild, please call our office. If you take any of these medications: Glipizide/Metformin, Avandament, Glucavance, please do not take 48 hours after completing test unless otherwise instructed.  We will call to schedule your test 2-4 weeks out understanding that some insurance companies will need an authorization prior to the service being performed.   For non-scheduling related questions, please contact the cardiac imaging nurse navigator should you have any questions/concerns: Rockwell Alexandria, Cardiac Imaging Nurse Navigator Larey Brick, Cardiac Imaging Nurse Navigator [facility] Direct Office Dial: [phone]   For scheduling needs, including cancellations and rescheduling, please call [place], [phone].   Your next appointment:   12 month(s)  The format for your next appointment:   In  Person  Provider:   Belva Crome, MD   Other Instructions Cardiac CT Angiogram A cardiac CT angiogram is a procedure to look at the heart and the area around the heart. It may be done to help find the cause of chest pains or other symptoms of heart disease. During this procedure, a substance called contrast dye is injected into the blood vessels in the area to be checked. A large X-ray machine, called a CT scanner, then takes detailed pictures of the heart and the surrounding area. The procedure is also sometimes called a coronary CT angiogram, coronary artery scanning, or CTA. A cardiac CT angiogram allows the health care provider to see how well blood is flowing to and from the heart. The health care provider will be able to see if there are any problems, such as: Blockage or narrowing of the coronary arteries in the heart. Fluid around the heart. Signs of weakness or disease in the muscles, valves, and tissues of the heart. Tell a health care provider about: Any allergies you have. This is especially important if you have had a previous allergic reaction to contrast dye. All medicines you are taking, including vitamins, herbs, eye drops, creams, and over-the-counter medicines. Any blood disorders you have. Any surgeries you have had. Any medical conditions you have. Whether you are pregnant or  may be pregnant. Any anxiety disorders, chronic pain, or other conditions you have that may increase your stress or prevent you from lying still. What are the risks? Generally, this is a safe procedure. However, problems may occur, including: Bleeding. Infection. Allergic reactions to medicines or dyes. Damage to other structures or organs. Kidney damage from the contrast dye that is used. Increased risk of cancer from radiation exposure. This risk is low. Talk with your health care provider about: The risks and benefits of testing. How you can receive the lowest dose of radiation. What  happens before the procedure? Wear comfortable clothing and remove any jewelry, glasses, dentures, and hearing aids. Follow instructions from your health care provider about eating and drinking. This may include: For 12 hours before the procedure -- avoid caffeine. This includes tea, coffee, soda, energy drinks, and diet pills. Drink plenty of water or other fluids that do not have caffeine in them. Being well hydrated can prevent complications. For 4-6 hours before the procedure -- stop eating and drinking. The contrast dye can cause nausea, but this is less likely if your stomach is empty. Ask your health care provider about changing or stopping your regular medicines. This is especially important if you are taking diabetes medicines, blood thinners, or medicines to treat problems with erections (erectile dysfunction). What happens during the procedure?  Hair on your chest may need to be removed so that small sticky patches called electrodes can be placed on your chest. These will transmit information that helps to monitor your heart during the procedure. An IV will be inserted into one of your veins. You might be given a medicine to control your heart rate during the procedure. This will help to ensure that good images are obtained. You will be asked to lie on an exam table. This table will slide in and out of the CT machine during the procedure. Contrast dye will be injected into the IV. You might feel warm, or you may get a metallic taste in your mouth. You will be given a medicine called nitroglycerin. This will relax or dilate the arteries in your heart. The table that you are lying on will move into the CT machine tunnel for the scan. The person running the machine will give you instructions while the scans are being done. You may be asked to: Keep your arms above your head. Hold your breath. Stay very still, even if the table is moving. When the scanning is complete, you will be moved out  of the machine. The IV will be removed. The procedure may vary among health care providers and hospitals. What can I expect after the procedure? After your procedure, it is common to have: A metallic taste in your mouth from the contrast dye. A feeling of warmth. A headache from the nitroglycerin. Follow these instructions at home: Take over-the-counter and prescription medicines only as told by your health care provider. If you are told, drink enough fluid to keep your urine pale yellow. This will help to flush the contrast dye out of your body. Most people can return to their normal activities right after the procedure. Ask your health care provider what activities are safe for you. It is up to you to get the results of your procedure. Ask your health care provider, or the department that is doing the procedure, when your results will be ready. Keep all follow-up visits as told by your health care provider. This is important. Contact a health care provider if:  You have any symptoms of allergy to the contrast dye. These include: Shortness of breath. Rash or hives. A racing heartbeat. Summary A cardiac CT angiogram is a procedure to look at the heart and the area around the heart. It may be done to help find the cause of chest pains or other symptoms of heart disease. During this procedure, a large X-ray machine, called a CT scanner, takes detailed pictures of the heart and the surrounding area after a contrast dye has been injected into blood vessels in the area. Ask your health care provider about changing or stopping your regular medicines before the procedure. This is especially important if you are taking diabetes medicines, blood thinners, or medicines to treat erectile dysfunction. If you are told, drink enough fluid to keep your urine pale yellow. This will help to flush the contrast dye out of your body. This information is not intended to replace advice given to you by your health  care provider. Make sure you discuss any questions you have with your health care provider. Document Revised: 06/30/2019 Document Reviewed: 06/30/2019 Elsevier Patient Education  2020 [employer].

## 2023-07-17 LAB — BASIC METABOLIC PANEL
BUN/Creatinine Ratio: 28 — ABNORMAL HIGH (ref 9–23)
BUN: 25 mg/dL — ABNORMAL HIGH (ref 6–24)
CO2: 22 mmol/L (ref 20–29)
Calcium: 9.7 mg/dL (ref 8.7–10.2)
Chloride: 91 mmol/L — ABNORMAL LOW (ref 96–106)
Creatinine, Ser: 0.89 mg/dL (ref 0.57–1.00)
Glucose: 105 mg/dL — ABNORMAL HIGH (ref 70–99)
Potassium: 5.1 mmol/L (ref 3.5–5.2)
Sodium: 130 mmol/L — ABNORMAL LOW (ref 134–144)
eGFR: 76 mL/min/{1.73_m2} (ref >59)

## 2023-07-22 ENCOUNTER — Telehealth: Payer: Self-pay | Admitting: Cardiology

## 2023-07-22 NOTE — Telephone Encounter (Signed)
Pt called in stating she has only been taking her bp for 3 days because her pharmacy didn't have the medicine. She wants to know if she should still come in tomorrow for her nurse visit. She also stated she wanted to ask the nurse a couple questions.

## 2023-07-22 NOTE — Telephone Encounter (Signed)
Pt wants to know if she has to have the cardiac CT and echo before she is cleared for surgery as she has not had any issues and has had 3 previous surgeries without issues.   Nurse visit has been rescheduled.

## 2023-07-23 ENCOUNTER — Ambulatory Visit: Payer: Medicare Other

## 2023-07-23 ENCOUNTER — Ambulatory Visit: Payer: Medicare Other | Admitting: Cardiology

## 2023-07-24 ENCOUNTER — Ambulatory Visit: Payer: Medicare Other | Attending: Cardiology

## 2023-07-24 VITALS — BP 132/80 | HR 88 | Resp 18 | Ht 64.0 in | Wt 227.0 lb

## 2023-07-24 DIAGNOSIS — I10 Essential (primary) hypertension: Secondary | ICD-10-CM

## 2023-07-24 NOTE — Progress Notes (Signed)
   Nurse Visit   Date of Encounter: 07/24/2023 ID: Lowella Fairy, DOB 12/12/1965, MRN [id number]  PCP:  Leane Call, PA-C   [facility] Providers Cardiologist:  Revanker     Visit Details   VS:  BP 132/80 (BP Location: Right Arm, Patient Position: Sitting, Cuff Size: Normal)   Pulse 88   Resp 18   Wt 227 lb (103 kg)   SpO2 97%   BMI 38.96 kg/m  , BMI Body mass index is 38.96 kg/m.  Wt Readings from Last 3 Encounters:  07/24/23 227 lb (103 kg)  07/16/23 237 lb 0.6 oz (107.5 kg)  05/28/23 245 lb (111.1 kg)     Reason for visit: vital signs, lab and review BP log Performed today: Vitals, Provider consulted:Revankar, and Education Changes (medications, testing, etc.) : none Length of Visit: 15 minutes    Medications Adjustments/Labs and Tests Ordered: No orders of the defined types were placed in this encounter.  No orders of the defined types were placed in this encounter.    Signed, Eleonore Chiquito, RN  07/24/2023 10:37 AM

## 2023-07-25 LAB — BASIC METABOLIC PANEL
BUN/Creatinine Ratio: 19 (ref 9–23)
BUN: 15 mg/dL (ref 6–24)
CO2: 28 mmol/L (ref 20–29)
Calcium: 10.3 mg/dL — ABNORMAL HIGH (ref 8.7–10.2)
Chloride: 87 mmol/L — ABNORMAL LOW (ref 96–106)
Creatinine, Ser: 0.78 mg/dL (ref 0.57–1.00)
Glucose: 107 mg/dL — ABNORMAL HIGH (ref 70–99)
Potassium: 4.3 mmol/L (ref 3.5–5.2)
Sodium: 130 mmol/L — ABNORMAL LOW (ref 134–144)
eGFR: 89 mL/min/{1.73_m2} (ref >59)

## 2023-07-31 ENCOUNTER — Telehealth (HOSPITAL_COMMUNITY): Payer: Self-pay | Admitting: *Deleted

## 2023-07-31 NOTE — Telephone Encounter (Signed)
Reaching out to patient to offer assistance regarding upcoming cardiac imaging study; pt verbalizes understanding of appt date/time, parking situation and where to check in, pre-test NPO status and medications ordered, and verified current allergies; name and call back number provided for further questions should they arise Hayley Sharpe RN Navigator Cardiac Imaging [facility] [phone] office [phone] cell  

## 2023-08-01 ENCOUNTER — Ambulatory Visit (HOSPITAL_COMMUNITY)
Admission: RE | Admit: 2023-08-01 | Discharge: 2023-08-01 | Disposition: A | Discharge status: Home / Self Care | Payer: Managed Care, Other (non HMO) | Source: Ambulatory Visit | Attending: Cardiology | Admitting: Cardiology

## 2023-08-01 DIAGNOSIS — Z9889 Other specified postprocedural states: Secondary | ICD-10-CM | POA: Insufficient documentation

## 2023-08-01 DIAGNOSIS — R0609 Other forms of dyspnea: Secondary | ICD-10-CM | POA: Diagnosis present

## 2023-08-01 DIAGNOSIS — I77810 Thoracic aortic ectasia: Secondary | ICD-10-CM | POA: Insufficient documentation

## 2023-08-01 DIAGNOSIS — Z01810 Encounter for preprocedural cardiovascular examination: Secondary | ICD-10-CM | POA: Diagnosis not present

## 2023-08-01 DIAGNOSIS — R9431 Abnormal electrocardiogram [ECG] [EKG]: Secondary | ICD-10-CM | POA: Diagnosis present

## 2023-08-01 DIAGNOSIS — I272 Pulmonary hypertension, unspecified: Secondary | ICD-10-CM

## 2023-08-01 DIAGNOSIS — R0789 Other chest pain: Secondary | ICD-10-CM | POA: Diagnosis not present

## 2023-08-01 DIAGNOSIS — Z01818 Encounter for other preprocedural examination: Secondary | ICD-10-CM | POA: Diagnosis present

## 2023-08-01 MED ORDER — IOHEXOL 350 MG/ML SOLN
100.0000 mL | Freq: Once | INTRAVENOUS | Status: AC | PRN
  Administered 2023-08-01: 100 mL via INTRAVENOUS

## 2023-08-01 MED ORDER — METOPROLOL TARTRATE 5 MG/5ML IV SOLN
5.0000 mg | Freq: Once | INTRAVENOUS | Status: AC
  Administered 2023-08-01: 5 mg via INTRAVENOUS

## 2023-08-01 MED ORDER — NITROGLYCERIN 0.4 MG SL SUBL
SUBLINGUAL_TABLET | SUBLINGUAL | Status: AC
  Filled 2023-08-01: qty 2

## 2023-08-01 MED ORDER — NITROGLYCERIN 0.4 MG SL SUBL
0.8000 mg | SUBLINGUAL_TABLET | Freq: Once | SUBLINGUAL | Status: AC
  Administered 2023-08-01: 0.8 mg via SUBLINGUAL

## 2023-08-01 MED ORDER — METOPROLOL TARTRATE 5 MG/5ML IV SOLN: 5.0000 mg | Freq: Once | INTRAVENOUS | Status: DC

## 2023-08-01 MED ORDER — METOPROLOL TARTRATE 5 MG/5ML IV SOLN
INTRAVENOUS | Status: AC
  Filled 2023-08-01: qty 10

## 2023-08-02 ENCOUNTER — Encounter: Payer: Self-pay | Admitting: Cardiology

## 2023-08-11 ENCOUNTER — Telehealth: Payer: Self-pay

## 2023-08-11 DIAGNOSIS — Z01810 Encounter for preprocedural cardiovascular examination: Secondary | ICD-10-CM

## 2023-08-11 DIAGNOSIS — I351 Nonrheumatic aortic (valve) insufficiency: Secondary | ICD-10-CM

## 2023-08-11 NOTE — Telephone Encounter (Addendum)
Patient Name: Audrey Cannon  DOB: 1966-07-06 MRN: [id number]  Primary Cardiologist: Dr. Tomie China  Chart reviewed as part of pre-operative protocol coverage. Patient was already seen 07/16/23 by Dr. Tomie China for pre-op evaluation for this surgery at which time he recommended coronary CTA. His note also references echocardiogram being done to assess aortic regurgitation. Coronary CTA result has been done with calcium score of zero, pending MD result note. Do not see echocardiogram ordered or completed so will route to Dr. Tomie China to forward their finalized clearance decision to requesting party below upon completion of testing and recommendations.  Will route this message to surgeon as Lorain Childes that final MD recommendation is forthcoming. Will remove this message from the pre-op box as separate preop APP input is not needed.  Laurann Montana, PA-C 08/11/2023, 3:43 PM

## 2023-08-11 NOTE — Telephone Encounter (Signed)
Pre-operative Risk Assessment    Patient Name: Audrey Cannon  DOB: 1966/07/29 MRN: [id number]      Request for Surgical Clearance    Procedure:   Lt TKA  Date of Surgery:  Clearance TBD                                 Surgeon:   Surgeon's Group or Practice Name:  [facility] Phone number:  [phone] Fax number:  [phone]   Type of Clearance Requested:   - Medical    Type of Anesthesia:  Spinal   Additional requests/questions:    SignedDione Housekeeper   08/11/2023, 10:16 AM

## 2023-08-13 NOTE — Addendum Note (Signed)
Addended by: Eleonore Chiquito on: 08/13/2023 10:32 AM   Modules accepted: Orders

## 2023-08-14 ENCOUNTER — Encounter: Payer: Self-pay | Admitting: Cardiology

## 2023-08-14 DIAGNOSIS — Z01810 Encounter for preprocedural cardiovascular examination: Secondary | ICD-10-CM

## 2023-08-14 DIAGNOSIS — I351 Nonrheumatic aortic (valve) insufficiency: Secondary | ICD-10-CM

## 2023-08-15 ENCOUNTER — Ambulatory Visit (HOSPITAL_COMMUNITY): Payer: Managed Care, Other (non HMO) | Attending: Cardiology

## 2023-08-15 DIAGNOSIS — I351 Nonrheumatic aortic (valve) insufficiency: Secondary | ICD-10-CM | POA: Diagnosis present

## 2023-08-15 DIAGNOSIS — Z01810 Encounter for preprocedural cardiovascular examination: Secondary | ICD-10-CM

## 2023-08-15 LAB — ECHOCARDIOGRAM COMPLETE
AR max vel: 2.84 cm2
AV Area VTI: 2.74 cm2
AV Area mean vel: 2.71 cm2
AV Mean grad: 4.5 mm[Hg]
AV Peak grad: 8 mm[Hg]
Ao pk vel: 1.41 m/s
Area-P 1/2: 3.12 cm2
P 1/2 time: 219 ms
S' Lateral: 2.4 cm

## 2023-08-25 ENCOUNTER — Telehealth: Payer: Self-pay | Admitting: Cardiology

## 2023-08-25 NOTE — Telephone Encounter (Signed)
  Audrey Cannon said they did not receive clearance from Dr. Tomie China and she requested if it can be refax to her. She gave fax# [phone] and ATTN: Audrey Cannon

## 2023-08-25 NOTE — Telephone Encounter (Signed)
Sent via EPIC fax

## 2023-08-28 ENCOUNTER — Other Ambulatory Visit: Payer: Self-pay

## 2023-08-28 ENCOUNTER — Ambulatory Visit (HOSPITAL_COMMUNITY): Payer: Managed Care, Other (non HMO) | Admitting: Anesthesiology

## 2023-08-28 ENCOUNTER — Encounter (HOSPITAL_COMMUNITY)
Admission: RE | Disposition: A | Discharge status: Home / Self Care | Payer: Self-pay | Source: Home / Self Care | Attending: Gastroenterology

## 2023-08-28 ENCOUNTER — Ambulatory Visit (HOSPITAL_COMMUNITY)
Admission: RE | Admit: 2023-08-28 | Discharge: 2023-08-28 | Disposition: A | Discharge status: Home / Self Care | Payer: Managed Care, Other (non HMO) | Attending: Gastroenterology | Admitting: Gastroenterology

## 2023-08-28 DIAGNOSIS — M797 Fibromyalgia: Secondary | ICD-10-CM | POA: Insufficient documentation

## 2023-08-28 DIAGNOSIS — E039 Hypothyroidism, unspecified: Secondary | ICD-10-CM | POA: Insufficient documentation

## 2023-08-28 DIAGNOSIS — Z6838 Body mass index (BMI) 38.0-38.9, adult: Secondary | ICD-10-CM | POA: Diagnosis not present

## 2023-08-28 DIAGNOSIS — F319 Bipolar disorder, unspecified: Secondary | ICD-10-CM | POA: Diagnosis not present

## 2023-08-28 DIAGNOSIS — K224 Dyskinesia of esophagus: Secondary | ICD-10-CM | POA: Insufficient documentation

## 2023-08-28 DIAGNOSIS — K581 Irritable bowel syndrome with constipation: Secondary | ICD-10-CM | POA: Insufficient documentation

## 2023-08-28 DIAGNOSIS — R059 Cough, unspecified: Secondary | ICD-10-CM | POA: Insufficient documentation

## 2023-08-28 DIAGNOSIS — E669 Obesity, unspecified: Secondary | ICD-10-CM | POA: Insufficient documentation

## 2023-08-28 DIAGNOSIS — R933 Abnormal findings on diagnostic imaging of other parts of digestive tract: Secondary | ICD-10-CM | POA: Diagnosis not present

## 2023-08-28 DIAGNOSIS — G43909 Migraine, unspecified, not intractable, without status migrainosus: Secondary | ICD-10-CM | POA: Insufficient documentation

## 2023-08-28 DIAGNOSIS — M069 Rheumatoid arthritis, unspecified: Secondary | ICD-10-CM | POA: Insufficient documentation

## 2023-08-28 DIAGNOSIS — Z79899 Other long term (current) drug therapy: Secondary | ICD-10-CM | POA: Insufficient documentation

## 2023-08-28 DIAGNOSIS — R1310 Dysphagia, unspecified: Secondary | ICD-10-CM | POA: Diagnosis present

## 2023-08-28 DIAGNOSIS — K3184 Gastroparesis: Secondary | ICD-10-CM | POA: Insufficient documentation

## 2023-08-28 DIAGNOSIS — K219 Gastro-esophageal reflux disease without esophagitis: Secondary | ICD-10-CM | POA: Insufficient documentation

## 2023-08-28 DIAGNOSIS — M1990 Unspecified osteoarthritis, unspecified site: Secondary | ICD-10-CM | POA: Diagnosis not present

## 2023-08-28 DIAGNOSIS — I10 Essential (primary) hypertension: Secondary | ICD-10-CM | POA: Diagnosis not present

## 2023-08-28 DIAGNOSIS — F419 Anxiety disorder, unspecified: Secondary | ICD-10-CM | POA: Diagnosis not present

## 2023-08-28 DIAGNOSIS — J45909 Unspecified asthma, uncomplicated: Secondary | ICD-10-CM | POA: Insufficient documentation

## 2023-08-28 DIAGNOSIS — Z79620 Long term (current) use of immunosuppressive biologic: Secondary | ICD-10-CM | POA: Insufficient documentation

## 2023-08-28 DIAGNOSIS — Q399 Congenital malformation of esophagus, unspecified: Secondary | ICD-10-CM

## 2023-08-28 DIAGNOSIS — K2289 Other specified disease of esophagus: Secondary | ICD-10-CM | POA: Insufficient documentation

## 2023-08-28 DIAGNOSIS — K2100 Gastro-esophageal reflux disease with esophagitis, without bleeding: Secondary | ICD-10-CM | POA: Diagnosis not present

## 2023-08-28 DIAGNOSIS — R1013 Epigastric pain: Secondary | ICD-10-CM

## 2023-08-28 DIAGNOSIS — K449 Diaphragmatic hernia without obstruction or gangrene: Secondary | ICD-10-CM | POA: Insufficient documentation

## 2023-08-28 DIAGNOSIS — G8929 Other chronic pain: Secondary | ICD-10-CM | POA: Insufficient documentation

## 2023-08-28 DIAGNOSIS — R053 Chronic cough: Secondary | ICD-10-CM

## 2023-08-28 DIAGNOSIS — K3189 Other diseases of stomach and duodenum: Secondary | ICD-10-CM | POA: Insufficient documentation

## 2023-08-28 DIAGNOSIS — Z79624 Long term (current) use of inhibitors of nucleotide synthesis: Secondary | ICD-10-CM | POA: Insufficient documentation

## 2023-08-28 HISTORY — PX: ESOPHAGOGASTRODUODENOSCOPY (EGD) WITH PROPOFOL: SHX5813

## 2023-08-28 HISTORY — PX: BIOPSY: SHX5522

## 2023-08-28 HISTORY — PX: MALONEY DILATION: SHX5535

## 2023-08-28 SURGERY — ESOPHAGOGASTRODUODENOSCOPY (EGD) WITH PROPOFOL
Anesthesia: Monitor Anesthesia Care

## 2023-08-28 MED ORDER — SODIUM CHLORIDE 0.9 % IV SOLN: INTRAVENOUS | Status: DC

## 2023-08-28 MED ORDER — FENTANYL CITRATE (PF) 100 MCG/2ML IJ SOLN
INTRAMUSCULAR | Status: AC
  Filled 2023-08-28: qty 2

## 2023-08-28 MED ORDER — FENTANYL CITRATE (PF) 100 MCG/2ML IJ SOLN
100.0000 ug | Freq: Once | INTRAMUSCULAR | Status: AC
  Administered 2023-08-28: 100 ug via INTRAVENOUS

## 2023-08-28 MED ORDER — PROPOFOL 10 MG/ML IV BOLUS
INTRAVENOUS | Status: DC | PRN
  Administered 2023-08-28 (×4): 50 mg via INTRAVENOUS

## 2023-08-28 MED ORDER — LIDOCAINE 2% (20 MG/ML) 5 ML SYRINGE
INTRAMUSCULAR | Status: DC | PRN
  Administered 2023-08-28: 100 mg via INTRAVENOUS

## 2023-08-28 SURGICAL SUPPLY — 15 items

## 2023-08-28 NOTE — Transfer of Care (Addendum)
Immediate Anesthesia Transfer of Care Note  Patient: Audrey Cannon  Procedure(s) Performed: ESOPHAGOGASTRODUODENOSCOPY (EGD) WITH PROPOFOL BIOPSY MALONEY DILATION  Patient Location: PACU  Anesthesia Type:MAC  Level of Consciousness: awake, alert , and oriented  Airway & Oxygen Therapy: Patient Spontanous Breathing and Patient connected to nasal cannula oxygen  Post-op Assessment: Report given to RN and Post -op Vital signs reviewed and stable  Post vital signs: Reviewed and stable  Last Vitals:  Vitals Value Taken Time  BP 131/85 1026  Temp 37   Pulse 80   Resp 14   SpO2 100     Last Pain:  Vitals:   08/28/23 0920  TempSrc: Temporal  PainSc: 10-Worst pain ever         Complications: No notable events documented.

## 2023-08-28 NOTE — Op Note (Signed)
[facility] Patient Name: Audrey Cannon Procedure Date : 08/28/2023 MRN: [id number] Attending MD: Lynann Bologna , MD, [id number] Date of Birth: 02-24-1966 CSN: [id number] Age: 57 Admit Type: Outpatient Procedure:                Upper GI endoscopy Indications:              Dysphagia with abnormal upper GI series. Providers:                Lynann Bologna, MD, Adin Hector, RN, Doristine Mango,                            RN, Kandice Robinsons, Technician Referring MD:              Medicines:                Monitored Anesthesia Care Complications:            No immediate complications. Estimated Blood Loss:     Estimated blood loss: none. Procedure:                Pre-Anesthesia Assessment:                           - Prior to the procedure, a History and Physical                            was performed, and patient medications and                            allergies were reviewed. The patient's tolerance of                            previous anesthesia was also reviewed. The risks                            and benefits of the procedure and the sedation                            options and risks were discussed with the patient.                            All questions were answered, and informed consent                            was obtained. Prior Anticoagulants: The patient has                            taken no anticoagulant or antiplatelet agents. ASA                            Grade Assessment: III - A patient with severe                            systemic disease. After reviewing the risks and  benefits, the patient was deemed in satisfactory                            condition to undergo the procedure.                           After obtaining informed consent, the endoscope was                            passed under direct vision. Throughout the                            procedure, the patient's blood pressure, pulse, and                             oxygen saturations were monitored continuously. The                            [id number]-H190 ([id number]) Olympus endoscope was introduced                            through the mouth, and advanced to the second part                            of duodenum. The upper GI endoscopy was                            accomplished without difficulty. The patient                            tolerated the procedure well. Scope In: Scope Out: Findings:      The esophagus was mildly dilated and tortuous with well-defined Z-line       at 35 cm. Biopsies were obtained from the proximal and distal esophagus       with cold forceps for histology to r/o eosinophilic esophagitis.       Estimated blood loss: none.      No endoscopic evidence of achalasia. It was decided, however, to proceed       with dilation of the entire esophagus. The scope was withdrawn. Dilation       was performed with a Maloney dilator with mild resistance at 52 Fr and       54 Fr.      A medium amount of food (residue) was found in the gastric fundus and in       the gastric body limiting examination. Antrum was normal. No outlet       obstruction. Biopsies were taken with a cold forceps for histology.       Estimated blood loss: none.      The examined duodenum was normal. Impression:               - Mildly dilated and tortuous esophagus s/p                            esophageal dilatation 54 Fr.                           -  A medium amount of food (residue) in the stomach                            without outlet obstruction. Biopsied.                           - Normal examined duodenum. Recommendation:           - Patient has a contact number available for                            emergencies. The signs and symptoms of potential                            delayed complications were discussed with the                            patient. Return to normal activities tomorrow.                            Written discharge  instructions were provided to the                            patient.                           - Post dilatation diet.                           - Continue present medications including Protonix                            40 mg p.o. daily. Stop Nexium.                           - Await pathology results.                           - If still with problems, consider esophageal                            manometry +/- GES. 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 No                            endoscopic evidence of achalasia.                           - Minimize narcotics.                           - The findings and recommendations were discussed  with the patient's family. Procedure Code(s):        --- Professional ---                           [phone], Esophagogastroduodenoscopy, flexible,                            transoral; with biopsy, single or multiple                           43450, Dilation of esophagus, by unguided sound or                            bougie, single or multiple passes Diagnosis Code(s):        --- Professional ---                           K22.89, Other specified disease of esophagus                           R13.10, Dysphagia, unspecified CPT copyright 2022 American Medical Association. All rights reserved. The codes documented in this report are preliminary and upon coder review may  be revised to meet current compliance requirements. Lynann Bologna, MD 08/28/2023 10:33:11 AM This report has been signed electronically. Number of Addenda: 0
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
## 2023-08-28 NOTE — Anesthesia Preprocedure Evaluation (Addendum)
Anesthesia Evaluation  Patient identified by MRN, date of birth, ID band Patient awake    Reviewed: Allergy & Precautions, H&P , NPO status , Patient's Chart, lab work & pertinent test results  History of Anesthesia Complications (+) PONV and history of anesthetic complications  Airway Mallampati: III  TM Distance: >3 FB Neck ROM: Full    Dental no notable dental hx. (+) Teeth Intact, Dental Advisory Given   Pulmonary neg pulmonary ROS   Pulmonary exam normal breath sounds clear to auscultation       Cardiovascular hypertension, Pt. on medications  Rhythm:Regular Rate:Normal     Neuro/Psych  Headaches  Anxiety Depression Bipolar Disorder      GI/Hepatic Neg liver ROS,GERD  Medicated,,  Endo/Other  Hypothyroidism    Renal/GU negative Renal ROS  negative genitourinary   Musculoskeletal  (+) Arthritis , Osteoarthritis,  Fibromyalgia -  Abdominal   Peds  Hematology negative hematology ROS (+)   Anesthesia Other Findings   Reproductive/Obstetrics negative OB ROS                             Anesthesia Physical Anesthesia Plan  ASA: 3  Anesthesia Plan: MAC   Post-op Pain Management: Minimal or no pain anticipated   Induction: Intravenous  PONV Risk Score and Plan: 3 and Propofol infusion  Airway Management Planned: Natural Airway and Simple Face Mask  Additional Equipment:   Intra-op Plan:   Post-operative Plan:   Informed Consent: I have reviewed the patients History and Physical, chart, labs and discussed the procedure including the risks, benefits and alternatives for the proposed anesthesia with the patient or authorized representative who has indicated his/her understanding and acceptance.     Dental advisory given  Plan Discussed with: CRNA  Anesthesia Plan Comments:        Anesthesia Quick Evaluation

## 2023-08-28 NOTE — Anesthesia Postprocedure Evaluation (Signed)
Anesthesia Post Note  Patient: Audrey Cannon  Procedure(s) Performed: ESOPHAGOGASTRODUODENOSCOPY (EGD) WITH PROPOFOL BIOPSY MALONEY DILATION     Patient location during evaluation: PACU Anesthesia Type: MAC Level of consciousness: awake and alert Pain management: pain level controlled Vital Signs Assessment: post-procedure vital signs reviewed and stable Respiratory status: spontaneous breathing, nonlabored ventilation and respiratory function stable Cardiovascular status: stable and blood pressure returned to baseline Postop Assessment: no apparent nausea or vomiting Anesthetic complications: no  There were no known notable events for this encounter.  Last Vitals:  Vitals:   08/28/23 1030 08/28/23 1045  BP: 127/66 111/67  Pulse: 76 75  Resp: 16 19  Temp:  36.7 C  SpO2: 97% 96%    Last Pain:  Vitals:   08/28/23 1030  TempSrc:   PainSc: 0-No pain                 Nalani Andreen,W. EDMOND

## 2023-08-29 ENCOUNTER — Other Ambulatory Visit: Payer: Self-pay

## 2023-08-29 ENCOUNTER — Encounter (HOSPITAL_BASED_OUTPATIENT_CLINIC_OR_DEPARTMENT_OTHER): Payer: Self-pay | Admitting: Orthopedic Surgery

## 2023-08-29 ENCOUNTER — Other Ambulatory Visit (HOSPITAL_COMMUNITY): Payer: Self-pay | Admitting: Orthopedic Surgery

## 2023-08-29 LAB — SURGICAL PATHOLOGY

## 2023-08-31 ENCOUNTER — Encounter (HOSPITAL_COMMUNITY): Payer: Self-pay | Admitting: Gastroenterology

## 2023-09-03 ENCOUNTER — Encounter (HOSPITAL_BASED_OUTPATIENT_CLINIC_OR_DEPARTMENT_OTHER): Payer: Self-pay | Admitting: Orthopedic Surgery

## 2023-09-04 ENCOUNTER — Ambulatory Visit (HOSPITAL_BASED_OUTPATIENT_CLINIC_OR_DEPARTMENT_OTHER)
Admission: RE | Admit: 2023-09-04 | Discharge: 2023-09-04 | Disposition: A | Discharge status: Home / Self Care | Payer: Managed Care, Other (non HMO) | Attending: Orthopedic Surgery | Admitting: Orthopedic Surgery

## 2023-09-04 ENCOUNTER — Other Ambulatory Visit: Payer: Self-pay

## 2023-09-04 ENCOUNTER — Encounter (HOSPITAL_BASED_OUTPATIENT_CLINIC_OR_DEPARTMENT_OTHER)
Admission: RE | Disposition: A | Discharge status: Home / Self Care | Payer: Self-pay | Source: Home / Self Care | Attending: Orthopedic Surgery

## 2023-09-04 ENCOUNTER — Encounter: Payer: Self-pay | Admitting: Gastroenterology

## 2023-09-04 ENCOUNTER — Encounter (HOSPITAL_BASED_OUTPATIENT_CLINIC_OR_DEPARTMENT_OTHER): Payer: Self-pay | Admitting: Orthopedic Surgery

## 2023-09-04 ENCOUNTER — Ambulatory Visit (HOSPITAL_BASED_OUTPATIENT_CLINIC_OR_DEPARTMENT_OTHER): Payer: Managed Care, Other (non HMO) | Admitting: Anesthesiology

## 2023-09-04 ENCOUNTER — Ambulatory Visit (HOSPITAL_COMMUNITY): Payer: Managed Care, Other (non HMO)

## 2023-09-04 DIAGNOSIS — Y831 Surgical operation with implant of artificial internal device as the cause of abnormal reaction of the patient, or of later complication, without mention of misadventure at the time of the procedure: Secondary | ICD-10-CM | POA: Diagnosis not present

## 2023-09-04 DIAGNOSIS — T8484XA Pain due to internal orthopedic prosthetic devices, implants and grafts, initial encounter: Secondary | ICD-10-CM | POA: Diagnosis present

## 2023-09-04 DIAGNOSIS — Z969 Presence of functional implant, unspecified: Secondary | ICD-10-CM

## 2023-09-04 DIAGNOSIS — I10 Essential (primary) hypertension: Secondary | ICD-10-CM | POA: Diagnosis not present

## 2023-09-04 DIAGNOSIS — X58XXXD Exposure to other specified factors, subsequent encounter: Secondary | ICD-10-CM | POA: Diagnosis not present

## 2023-09-04 DIAGNOSIS — E039 Hypothyroidism, unspecified: Secondary | ICD-10-CM | POA: Diagnosis not present

## 2023-09-04 DIAGNOSIS — Z01818 Encounter for other preprocedural examination: Secondary | ICD-10-CM

## 2023-09-04 DIAGNOSIS — S82201D Unspecified fracture of shaft of right tibia, subsequent encounter for closed fracture with routine healing: Secondary | ICD-10-CM | POA: Diagnosis not present

## 2023-09-04 HISTORY — PX: HARDWARE REMOVAL: SHX979

## 2023-09-04 SURGERY — REMOVAL, HARDWARE
Anesthesia: General | Site: Leg Lower | Laterality: Right

## 2023-09-04 MED ORDER — PROPOFOL 500 MG/50ML IV EMUL
INTRAVENOUS | Status: DC | PRN
Start: 2023-09-04 — End: 2023-09-04
  Administered 2023-09-04: 150 ug/kg/min via INTRAVENOUS

## 2023-09-04 MED ORDER — MIDAZOLAM HCL 2 MG/2ML IJ SOLN
INTRAMUSCULAR | Status: AC
  Filled 2023-09-04: qty 2

## 2023-09-04 MED ORDER — ACETAMINOPHEN 10 MG/ML IV SOLN
1000.0000 mg | Freq: Once | INTRAVENOUS | Status: DC | PRN
  Administered 2023-09-04: 1000 mg via INTRAVENOUS

## 2023-09-04 MED ORDER — VANCOMYCIN HCL IN DEXTROSE 1-5 GM/200ML-% IV SOLN
INTRAVENOUS | Status: AC
  Filled 2023-09-04: qty 200

## 2023-09-04 MED ORDER — FENTANYL CITRATE (PF) 100 MCG/2ML IJ SOLN
INTRAMUSCULAR | Status: AC
  Filled 2023-09-04: qty 2

## 2023-09-04 MED ORDER — VANCOMYCIN HCL 500 MG IV SOLR
INTRAVENOUS | Status: DC | PRN
  Administered 2023-09-04: 500 mg via TOPICAL

## 2023-09-04 MED ORDER — ONDANSETRON HCL 4 MG/2ML IJ SOLN
INTRAMUSCULAR | Status: AC
  Filled 2023-09-04: qty 2

## 2023-09-04 MED ORDER — DEXMEDETOMIDINE HCL IN NACL 80 MCG/20ML IV SOLN
INTRAVENOUS | Status: DC | PRN
Start: 2023-09-04 — End: 2023-09-04
  Administered 2023-09-04: 12 ug via INTRAVENOUS

## 2023-09-04 MED ORDER — OXYCODONE HCL 5 MG/5ML PO SOLN: 5.0000 mg | Freq: Once | ORAL | Status: AC | PRN

## 2023-09-04 MED ORDER — LIDOCAINE HCL (CARDIAC) PF 100 MG/5ML IV SOSY
PREFILLED_SYRINGE | INTRAVENOUS | Status: DC | PRN
  Administered 2023-09-04: 50 mg via INTRAVENOUS

## 2023-09-04 MED ORDER — DEXAMETHASONE SODIUM PHOSPHATE 10 MG/ML IJ SOLN
INTRAMUSCULAR | Status: AC
  Filled 2023-09-04: qty 1

## 2023-09-04 MED ORDER — PROPOFOL 500 MG/50ML IV EMUL
INTRAVENOUS | Status: AC
  Filled 2023-09-04: qty 50

## 2023-09-04 MED ORDER — ONDANSETRON HCL 4 MG/2ML IJ SOLN
INTRAMUSCULAR | Status: DC | PRN
  Administered 2023-09-04: 4 mg via INTRAVENOUS

## 2023-09-04 MED ORDER — SENNA 8.6 MG PO TABS: 2.0000 | ORAL_TABLET | Freq: Two times a day (BID) | ORAL | 0 refills | Status: DC

## 2023-09-04 MED ORDER — HYDROMORPHONE HCL 1 MG/ML IJ SOLN
INTRAMUSCULAR | Status: AC
  Filled 2023-09-04: qty 0.5

## 2023-09-04 MED ORDER — BUPIVACAINE-EPINEPHRINE (PF) 0.5% -1:200000 IJ SOLN
INTRAMUSCULAR | Status: AC
  Filled 2023-09-04: qty 30

## 2023-09-04 MED ORDER — DOCUSATE SODIUM 100 MG PO CAPS: 100.0000 mg | ORAL_CAPSULE | Freq: Two times a day (BID) | ORAL | 0 refills | Status: DC

## 2023-09-04 MED ORDER — ONDANSETRON HCL 4 MG/2ML IJ SOLN: 4.0000 mg | Freq: Once | INTRAMUSCULAR | Status: DC | PRN

## 2023-09-04 MED ORDER — ACETAMINOPHEN 10 MG/ML IV SOLN
INTRAVENOUS | Status: AC
  Filled 2023-09-04: qty 100

## 2023-09-04 MED ORDER — FENTANYL CITRATE (PF) 100 MCG/2ML IJ SOLN
25.0000 ug | INTRAMUSCULAR | Status: DC | PRN
  Administered 2023-09-04 (×2): 50 ug via INTRAVENOUS

## 2023-09-04 MED ORDER — OXYCODONE HCL 5 MG PO TABS
5.0000 mg | ORAL_TABLET | Freq: Four times a day (QID) | ORAL | 0 refills | Status: AC | PRN
Start: 2023-09-04 — End: 2023-09-07

## 2023-09-04 MED ORDER — FENTANYL CITRATE (PF) 100 MCG/2ML IJ SOLN
100.0000 ug | Freq: Once | INTRAMUSCULAR | Status: AC
  Administered 2023-09-04: 100 ug via INTRAVENOUS

## 2023-09-04 MED ORDER — 0.9 % SODIUM CHLORIDE (POUR BTL) OPTIME
TOPICAL | Status: DC | PRN
  Administered 2023-09-04: 300 mL

## 2023-09-04 MED ORDER — OXYCODONE HCL 5 MG PO TABS
ORAL_TABLET | ORAL | Status: AC
  Filled 2023-09-04: qty 1

## 2023-09-04 MED ORDER — DEXMEDETOMIDINE HCL IN NACL 80 MCG/20ML IV SOLN
INTRAVENOUS | Status: AC
  Filled 2023-09-04: qty 20

## 2023-09-04 MED ORDER — HYDROMORPHONE HCL 1 MG/ML IJ SOLN
INTRAMUSCULAR | Status: DC | PRN
Start: 2023-09-04 — End: 2023-09-04
  Administered 2023-09-04 (×2): .5 mg via INTRAVENOUS

## 2023-09-04 MED ORDER — LEVOFLOXACIN IN D5W 500 MG/100ML IV SOLN
500.0000 mg | INTRAVENOUS | Status: AC
  Administered 2023-09-04: 500 mg via INTRAVENOUS

## 2023-09-04 MED ORDER — VANCOMYCIN HCL 500 MG IV SOLR
INTRAVENOUS | Status: AC
  Filled 2023-09-04: qty 10

## 2023-09-04 MED ORDER — LEVOFLOXACIN IN D5W 500 MG/100ML IV SOLN
INTRAVENOUS | Status: AC
  Filled 2023-09-04: qty 100

## 2023-09-04 MED ORDER — LACTATED RINGERS IV SOLN: INTRAVENOUS | Status: DC

## 2023-09-04 MED ORDER — PROPOFOL 10 MG/ML IV BOLUS
INTRAVENOUS | Status: DC | PRN
  Administered 2023-09-04: 170 mg via INTRAVENOUS

## 2023-09-04 MED ORDER — DEXAMETHASONE SODIUM PHOSPHATE 4 MG/ML IJ SOLN
INTRAMUSCULAR | Status: DC | PRN
  Administered 2023-09-04: 5 mg via INTRAVENOUS

## 2023-09-04 MED ORDER — MIDAZOLAM HCL 2 MG/2ML IJ SOLN
2.0000 mg | Freq: Once | INTRAMUSCULAR | Status: AC
  Administered 2023-09-04: 2 mg via INTRAVENOUS

## 2023-09-04 MED ORDER — ASPIRIN 81 MG PO TBEC: 81.0000 mg | DELAYED_RELEASE_TABLET | Freq: Two times a day (BID) | ORAL | 0 refills | Status: DC

## 2023-09-04 MED ORDER — VANCOMYCIN HCL IN DEXTROSE 1-5 GM/200ML-% IV SOLN
1000.0000 mg | INTRAVENOUS | Status: AC
  Administered 2023-09-04: 1000 mg via INTRAVENOUS

## 2023-09-04 MED ORDER — SODIUM CHLORIDE 0.9 % IV SOLN: INTRAVENOUS | Status: DC

## 2023-09-04 MED ORDER — HYDROMORPHONE HCL 1 MG/ML IJ SOLN: 0.5000 mg | INTRAMUSCULAR | Status: DC | PRN

## 2023-09-04 MED ORDER — OXYCODONE HCL 5 MG PO TABS
5.0000 mg | ORAL_TABLET | Freq: Once | ORAL | Status: AC | PRN
  Administered 2023-09-04: 5 mg via ORAL

## 2023-09-04 MED ORDER — FENTANYL CITRATE (PF) 100 MCG/2ML IJ SOLN
INTRAMUSCULAR | Status: DC | PRN
  Administered 2023-09-04 (×4): 50 ug via INTRAVENOUS

## 2023-09-04 SURGICAL SUPPLY — 68 items
APL PRP STRL LF DISP 70% ISPRP (MISCELLANEOUS) ×1
APL SKNCLS STERI-STRIP NONHPOA (GAUZE/BANDAGES/DRESSINGS)
BANDAGE ESMARK 6X9 LF (GAUZE/BANDAGES/DRESSINGS) IMPLANT
BENZOIN TINCTURE PRP APPL 2/3 (GAUZE/BANDAGES/DRESSINGS) IMPLANT
BLADE SURG 15 STRL LF DISP TIS (BLADE) ×2 IMPLANT
BLADE SURG 15 STRL SS (BLADE) ×2
BNDG CMPR 5X4 KNIT ELC UNQ LF (GAUZE/BANDAGES/DRESSINGS) ×1
BNDG CMPR 6 X 5 YARDS HK CLSR (GAUZE/BANDAGES/DRESSINGS)
BNDG CMPR 9X4 STRL LF SNTH (GAUZE/BANDAGES/DRESSINGS)
BNDG CMPR 9X6 STRL LF SNTH (GAUZE/BANDAGES/DRESSINGS)
BNDG CMPR MED 10X6 ELC LF (GAUZE/BANDAGES/DRESSINGS) ×1
BNDG ELASTIC 4INX 5YD STR LF (GAUZE/BANDAGES/DRESSINGS) IMPLANT
BNDG ELASTIC 6INX 5YD STR LF (GAUZE/BANDAGES/DRESSINGS) IMPLANT
BNDG ELASTIC 6X10 VLCR STRL LF (GAUZE/BANDAGES/DRESSINGS) IMPLANT
BNDG ESMARK 4X9 LF (GAUZE/BANDAGES/DRESSINGS) IMPLANT
BNDG ESMARK 6X9 LF (GAUZE/BANDAGES/DRESSINGS)
CHLORAPREP W/TINT 26 (MISCELLANEOUS) ×1 IMPLANT
COVER BACK TABLE 60X90IN (DRAPES) ×1 IMPLANT
CUFF TOURN SGL QUICK 34 (TOURNIQUET CUFF)
CUFF TOURN SGL QUICK 42 (TOURNIQUET CUFF) IMPLANT
CUFF TRNQT CYL 34X4.125X (TOURNIQUET CUFF) IMPLANT
DRAPE C-ARM 42X72 X-RAY (DRAPES) ×1 IMPLANT
DRAPE C-ARMOR (DRAPES) ×1 IMPLANT
DRAPE EXTREMITY T 121X128X90 (DISPOSABLE) ×1 IMPLANT
DRAPE SURG 17X23 STRL (DRAPES) IMPLANT
DRAPE U-SHAPE 47X51 STRL (DRAPES) ×1 IMPLANT
DRSG MEPITEL 4X7.2 (GAUZE/BANDAGES/DRESSINGS) ×1 IMPLANT
ELECT REM PT RETURN 9FT ADLT (ELECTROSURGICAL) ×1
ELECTRODE REM PT RTRN 9FT ADLT (ELECTROSURGICAL) ×1 IMPLANT
GAUZE PAD ABD 8X10 STRL (GAUZE/BANDAGES/DRESSINGS) IMPLANT
GAUZE SPONGE 4X4 12PLY STRL (GAUZE/BANDAGES/DRESSINGS) ×1 IMPLANT
GLOVE BIO SURGEON STRL SZ8 (GLOVE) ×1 IMPLANT
GLOVE BIOGEL PI IND STRL 7.0 (GLOVE) IMPLANT
GLOVE BIOGEL PI IND STRL 8 (GLOVE) ×2 IMPLANT
GLOVE ECLIPSE 8.0 STRL XLNG CF (GLOVE) ×1 IMPLANT
GLOVE SURG SS PI 7.0 STRL IVOR (GLOVE) IMPLANT
GOWN STRL REUS W/ TWL LRG LVL3 (GOWN DISPOSABLE) ×1 IMPLANT
GOWN STRL REUS W/ TWL XL LVL3 (GOWN DISPOSABLE) ×2 IMPLANT
GOWN STRL REUS W/TWL LRG LVL3 (GOWN DISPOSABLE) ×2
GOWN STRL REUS W/TWL XL LVL3 (GOWN DISPOSABLE) ×2
NDL HYPO 22X1.5 SAFETY MO (MISCELLANEOUS) IMPLANT
NEEDLE HYPO 22X1.5 SAFETY MO (MISCELLANEOUS) IMPLANT
PACK BASIN DAY SURGERY FS (CUSTOM PROCEDURE TRAY) ×1 IMPLANT
PAD CAST 4YDX4 CTTN HI CHSV (CAST SUPPLIES) ×1 IMPLANT
PADDING CAST COTTON 4X4 STRL (CAST SUPPLIES) ×1
PADDING CAST COTTON 6X4 STRL (CAST SUPPLIES) IMPLANT
PENCIL SMOKE EVACUATOR (MISCELLANEOUS) ×1 IMPLANT
SANITIZER HAND PURELL FF 515ML (MISCELLANEOUS) ×1 IMPLANT
SHEET MEDIUM DRAPE 40X70 STRL (DRAPES) ×1 IMPLANT
SLEEVE SCD COMPRESS KNEE MED (STOCKING) ×1 IMPLANT
SPIKE FLUID TRANSFER (MISCELLANEOUS) IMPLANT
SPLINT PLASTER CAST FAST 5X30 (CAST SUPPLIES) IMPLANT
SPONGE T-LAP 18X18 ~~LOC~~+RFID (SPONGE) ×1 IMPLANT
STOCKINETTE 6 STRL (DRAPES) ×1 IMPLANT
STRIP CLOSURE SKIN 1/2X4 (GAUZE/BANDAGES/DRESSINGS) IMPLANT
SUCTION TUBE FRAZIER 10FR DISP (SUCTIONS) IMPLANT
SUT ETHILON 3 0 PS 1 (SUTURE) IMPLANT
SUT MNCRL AB 3-0 PS2 18 (SUTURE) ×1 IMPLANT
SUT VIC AB 2-0 SH 18 (SUTURE) IMPLANT
SUT VIC AB 2-0 SH 27 (SUTURE)
SUT VIC AB 2-0 SH 27XBRD (SUTURE) IMPLANT
SUT VICRYL 0 SH 27 (SUTURE) IMPLANT
SYR BULB EAR ULCER 3OZ GRN STR (SYRINGE) ×1 IMPLANT
SYR CONTROL 10ML LL (SYRINGE) IMPLANT
TOWEL GREEN STERILE FF (TOWEL DISPOSABLE) ×1 IMPLANT
TUBE CONNECTING 20X1/4 (TUBING) IMPLANT
UNDERPAD 30X36 HEAVY ABSORB (UNDERPADS AND DIAPERS) ×1 IMPLANT
YANKAUER SUCT BULB TIP NO VENT (SUCTIONS) IMPLANT

## 2023-09-04 NOTE — H&P (Signed)
Audrey Cannon is an 57 y.o. female.   Chief Complaint: right leg pain HPI: 57 y/o female c/o R leg pain after tibial nailing for a tibia fracture in the remote past.  She has painful hardware of the right ankle and proximal leg as well as the knee.  She presents today for hardware removal from the medial leg and ankle and the knee.  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 DAVID DOMINGUEZ, NP [phone]) [place] [facility] of this note might be different from the original. 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 C5-6 fusion 10/2010 [facility] Neurosurgery;(hx [place] [facility] BOTOX INJECTIONS, CHIROPRACTER, MASSAGE [place]) Formatting of this note might be different from the original. C5-6 fusion 10/2010 [facility] Neurosurgery;(hx [place] [facility] BOTOX INJECTIONS, CHIROPRACTER, MASSAGE [place])   Dysphagia 05/19/2023   Elevated white blood cell count, unspecified 07/16/2016   Fibromyalgia 06/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 colonoscopy 2007 negative, repeat Nov 2011 negative (fam hx, repeat 2016)   High risk medication use 04/18/2022   High risk medications (not anticoagulants) long-term use 05/08/2011   Formatting of this note might be different from the original. [facility] Medicine (contract scanned into [employer]);#120 Oxycodone/month;Most recent UDS: 04/2011 (appropriate) Formatting of this note might be different from the original. [facility] Medicine (contract scanned into [employer]);#120 Oxycodone/month;Most recent UDS: 04/2011 (appropriate)   Hypertension    Hyponatremia 01/15/2019   Hypothyroidism 09/10/2011   MDD (major depressive disorder), recurrent episode, severe (HCC) 03/16/2019   Migraine 06/26/2009   Mixed dyslipidemia 03/17/2020   Morbid obesity (HCC) 04/19/2022   Opioid-induced hyperalgesia 04/18/2022   Oral aphthae 08/15/2011   Polyarthralgia 12/23/2018   PONV (postoperative nausea and vomiting)    Post concussion syndrome 03/16/2019   Post traumatic stress disorder (PTSD) 03/16/2019   Preoperative clearance 06/19/2023   Pyelonephritis 04/18/2022   Recurrent major depressive disorder, in partial remission (HCC) 09/01/2017   Rheumatoid arthritis (HCC) 04/18/2022   States associated with artificial menopause 06/26/2009   TBI (traumatic brain injury) (HCC) 11/01/2015   Thoracic aortic aneurysm without rupture (HCC) 09/01/2017   Formatting of this note might be different from the original. H/o repair 2/15 outside records indicate moderate aortic regurgitation and aneurysm measuring 4.6 cm by CT.  Patient had significant dyspnea on exertion and had replacement of the ascending aorta with 26 Hemashield graft.  The aortic valve was "healthy "with minor calcification small median raphae with  slight right-left cusp fusion but    Unspecified osteoarthritis, unspecified site  06/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 R knee microfracture surgery/lateral patellar release 01/2013 [facility]   Upper GI bleeding 06/26/2009    Past Surgical History:  Procedure Laterality Date   APPENDECTOMY  1993   WITH HYSTERECTOMY   BIOPSY  08/28/2023   Procedure: BIOPSY;  Surgeon: Lynann Bologna, MD;  Location: [facility] ENDOSCOPY;  Service: Gastroenterology;;   CARDIAC VALVE SURGERY  2015   Ascending aortic anuerysm   Discectomy Fusion C3/4  03/2010   ESOPHAGOGASTRODUODENOSCOPY  09/03/2016   [facility].   ESOPHAGOGASTRODUODENOSCOPY (EGD) WITH PROPOFOL N/A 08/28/2023   Procedure: ESOPHAGOGASTRODUODENOSCOPY (EGD) WITH PROPOFOL;  Surgeon: Lynann Bologna, MD;  Location: [facility] ENDOSCOPY;  Service: Gastroenterology;  Laterality: N/A;   KNEE ARTHROSCOPY Right 2004   KNEE ARTHROSCOPY Left 2014   MALONEY DILATION  08/28/2023   Procedure: MALONEY DILATION;  Surgeon: Lynann Bologna, MD;  Location: [facility] ENDOSCOPY;  Service: Gastroenterology;;   SPINAL CORD STIMULATOR REMOVAL  04/2023   TIBIA IM NAIL INSERTION Right 07/19/2021   Procedure: 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;  Surgeon: Toni Arthurs, MD;  Location: [facility];  Service: Orthopedics;  Laterality: Right;   TONSILLECTOMY  1976    Family History  Problem Relation Age of Onset   Hypertension Mother    Stroke Mother    Colon cancer Mother    Cancer Father    Arthritis Father    Seizures Sister    Esophageal cancer Neg Hx    Liver disease Neg Hx    Colon polyps Neg Hx    Rectal cancer Neg Hx    Stomach cancer Neg Hx    Social History:  reports that she has never smoked. She has never used smokeless tobacco. She reports that she does not drink alcohol and does not use drugs.  Allergies:  Allergies  Allergen Reactions   Hydrochlorothiazide Other (See Comments)    Extremely severe headache    Cefpodoxime  Other (See Comments)    Trouble breathing, fast/pounding heartbeat, nausea, blurry vision, slowed speech, stomach cramps, confusion, inability to lift head off pillow   Cefuroxime Other (See Comments)    Tachycardia, dark urine, nausea, severe diarrhea, stomach pain and cramping, severe listlessness, weakness, slurred speech, confusion, slept 14 hours, unable to walk alone   Cymbalta [Duloxetine Hcl] Other (See Comments)    No appetite, inability to stay awake, nausea, severe itching, dark urine, extremelty weak, shaky, severe headache   Serotonin Reuptake Inhibitors (Ssris) Other (See Comments)    Nausea, dizziness, severe fatigue, anxiety, lethargy, HA, severe agitation, excessive sleeping    No medications prior to admission.    No results found for this or any previous visit (from the past 48 hour(s)). No results found.  Review of Systems  no recent f/c/n/v/wt loss  Height 5\' 4"  (1.626 m), weight 102.1 kg. Physical Exam  Wn wd woman in nad.  A and O.  EOMI.  Resp unlabored.  R knee ttp at the nail entry site anteriorly.  Also tender at the medial leg at the interlock screws and the medial ankle at the distal interlock screws.  Skin is healthy.  Intact sens to LT at the medial leg and anterior knee.  5/5 strength in PF and DF of the ankle and toes.  Assessment/Plan Painful hardware at the knee, proximal leg and ankle.  To  the OR today for removal of deep implants from all three sites.  The risks and benefits of the alternative treatment options have been discussed in detail.  The patient wishes to proceed with surgery and specifically understands risks of bleeding, infection, nerve damage, blood clots, need for additional surgery, amputation and death.   Toni Arthurs, MD 09/27/2023, 7:23 AM

## 2023-09-04 NOTE — Op Note (Signed)
09/04/2023  10:20 AM  PATIENT:  Audrey Cannon  57 y.o. female  PRE-OPERATIVE DIAGNOSIS: Painful hardware right knee, right proximal leg and right medial ankle  POST-OPERATIVE DIAGNOSIS: Same  Procedure(s): 1.  Removal of deep implants from the right proximal leg 2.  Removal of deep implants from the right medial ankle through separate incisions 3.  Removal of deep implants from the right knee through separate incisions  SURGEON:  Toni Arthurs, MD  ASSISTANT: Alfredo Martinez, PA-C  ANESTHESIA:   General, regional  EBL:  minimal   TOURNIQUET:   Total Tourniquet Time Documented: Thigh (Right) - 60 minutes Total: Thigh (Right) - 60 minutes  COMPLICATIONS:  None apparent  DISPOSITION:  Extubated, awake and stable to recovery.  INDICATION FOR PROCEDURE: 57 year old female without significant past medical history complains of pain at her right knee, leg and ankle.  She sustained a tibia fracture several years ago and underwent open treatment with intramedullary nailing.  She has painful hardware at the right knee, right proximal leg and right medial ankle.  She presents today for hardware removal having failed all nonoperative treatment to date.  Her fracture is clinically and radiographically healed.  PROCEDURE IN DETAIL: After preoperative consent was obtained and the correct operative site was identified, the patient was brought to the operating room and placed supine on the operating table.  General anesthesia was induced.  Preoperative antibiotics were administered.  Surgical timeout was taken.  The right lower extremity was prepped and draped in standard sterile fashion with a tourniquet around the thigh.  The extremity was elevated, and the tourniquet was inflated to 350 mmHg.  The previous surgical incision at the proximal leg medially was identified.  It was opened again sharply.  The screw head was identified.  It was cleaned of all fibrous tissue.  Screwdriver was inserted down  through the subcutaneous tissues to the screw head.  The screw was removed without difficulty.  The lateral screw in the proximal leg was removed in the same fashion.  Attention was then turned to the knee.  After consultation with Dr. Sherlean Foot, a medial parapatellar incision was made.  Dissection was carried sharply down through the subcutaneous tissues.  The patella tendon was protected.  The medial retinaculum was partially released allowing exposure of the proximal anterior tibial bone.  AP and lateral radiographs were used to confirm the position of the nail.  Increasing sizes of curettes were used to expose the nail head.  The patella could not be translated laterally sufficiently to allow insertion of the extraction bolt.  A superolateral portal was established.  The extraction bolt was then inserted down through the knee joint and into the nail.  It was tightened securely.  Attention was then turned to the medial ankle.  The previous incision was opened sharply.  The curette was used to remove fibrous tissue from the screw head.  The screw was removed without difficulty.  The second screw at the medial ankle was removed in the same fashion.  Attention was returned to the knee.  The slaphammer was attached to the extraction bolt.  AP and lateral fluoroscopy views confirmed that the nail was not impinging on the proximal tibia or the patella as it was carefully removed with a slap hammer.  AP and lateral radiographs of the knee, ankle and fracture site were obtained confirming complete removal of all metallic implants.  There is no evidence of acute injuries.  The wounds were irrigated copiously.  The knee was  sprinkled with vancomycin powder.  The retinaculum was repaired with figure-of-eight sutures of 0 Vicryl.  The subcutaneous tissues were closed with Monocryl.  All of the skin incisions were closed with 3-0 nylon.  Sterile dressings were applied followed by a compression wrap.  The tourniquet was  released after application of the dressings at 1 hour.  The patient was awakened from anesthesia and transported to the recovery room in stable condition.   FOLLOW UP PLAN: Weightbearing as tolerated on the right lower extremity.  Cam boot for comfort as needed.  Aspirin for DVT prophylaxis.  Follow-up in the office in 2 weeks for suture removal.    Alfredo Martinez PA-C was present and scrubbed for the duration of the operative case. His assistance was essential in positioning the patient, prepping and draping, gaining and maintaining exposure, performing the operation, closing and dressing the wounds and applying the splint.

## 2023-09-04 NOTE — Progress Notes (Signed)
Assisted Dr. Ace Gins with right, adductor canal, popliteal, ultrasound guided block. Side rails up, monitors on throughout procedure. See vital signs in flow sheet. Tolerated Procedure well.

## 2023-09-04 NOTE — Transfer of Care (Signed)
Immediate Anesthesia Transfer of Care Note  Patient: Audrey Cannon  Procedure(s) Performed: HARDWARE REMOVAL OF RIGHT KNEE, MEDIAL PROXIMAL TIBIA AND MEDIAL DISTAL TIBIA (Right: Leg Lower)  Patient Location: PACU  Anesthesia Type:General  Level of Consciousness: awake, alert , and oriented  Airway & Oxygen Therapy: Patient Spontanous Breathing and Patient connected to face mask oxygen  Post-op Assessment: Report given to RN and Post -op Vital signs reviewed and stable  Post vital signs: Reviewed and stable  Last Vitals:  Vitals Value Taken Time  BP 114/61 09/04/23 1020  Temp    Pulse 67 09/04/23 1023  Resp 13 09/04/23 1023  SpO2 98 % 09/04/23 1023  Vitals shown include unfiled device data.  Last Pain:  Vitals:   09/04/23 0757  TempSrc: Temporal  PainSc: 0-No pain         Complications: No notable events documented.

## 2023-09-04 NOTE — Discharge Instructions (Addendum)
Toni Arthurs, MD [facility]  Please read the following information regarding your care after surgery.  Medications  You only need a prescription for the narcotic pain medicine (ex. oxycodone, Percocet, Norco).  All of the other medicines listed below are available over the counter. ? Aleve 2 pills twice a day for the first 3 days after surgery. ? acetominophen (Tylenol) 650 mg every 4-6 hours as you need for minor to moderate pain ? oxycodone as prescribed for severe pain  Narcotic pain medicine (ex. oxycodone, Percocet, Vicodin) will cause constipation.  To prevent this problem, take the following medicines while you are taking any pain medicine. ? docusate sodium (Colace) 100 mg twice a day ? senna (Senokot) 2 tablets twice a day  ? To help prevent blood clots, take a baby aspirin (81 mg) twice a day for two weeks after surgery.  You should also get up every hour while you are awake to move around.    Weight Bearing ? Bear weight when you are able on your operated leg or foot.   Cast / Splint / Dressing ? Keep your splint, cast or dressing clean and dry.  Don't put anything (coat hanger, pencil, etc) down inside of it.  If it gets damp, use a hair dryer on the cool setting to dry it.  If it gets soaked, call the office to schedule an appointment for a cast change.   After your dressing, cast or splint is removed; you may shower, but do not soak or scrub the wound.  Allow the water to run over it, and then gently pat it dry.  Swelling It is normal for you to have swelling where you had surgery.  To reduce swelling and pain, keep your toes above your nose for at least 3 days after surgery.  It may be necessary to keep your foot or leg elevated for several weeks.  If it hurts, it should be elevated.  Follow Up Call my office at [phone] when you are discharged from the hospital or surgery center to schedule an appointment to be seen two weeks after surgery.  Call my office at  [phone] if you develop a fever >101.5 F, nausea, vomiting, bleeding from the surgical site or severe pain.     Post Anesthesia Home Care Instructions  Activity: Get plenty of rest for the remainder of the day. A responsible individual must stay with you for 24 hours following the procedure.  For the next 24 hours, DO NOT: -Drive a car -Advertising copywriter -Drink alcoholic beverages -Take any medication unless instructed by your physician -Make any legal decisions or sign important papers.  Meals: Start with liquid foods such as gelatin or soup. Progress to regular foods as tolerated. Avoid greasy, spicy, heavy foods. If nausea and/or vomiting occur, drink only clear liquids until the nausea and/or vomiting subsides. Call your physician if vomiting continues.  Special Instructions/Symptoms: Your throat may feel dry or sore from the anesthesia or the breathing tube placed in your throat during surgery. If this causes discomfort, gargle with warm salt water. The discomfort should disappear within 24 hours.  If you had a scopolamine patch placed behind your ear for the management of post- operative nausea and/or vomiting:  1. The medication in the patch is effective for 72 hours, after which it should be removed.  Wrap patch in a tissue and discard in the trash. Wash hands thoroughly with soap and water. 2. You may remove the patch earlier than 72 hours if  you experience unpleasant side effects which may include dry mouth, dizziness or visual disturbances. 3. Avoid touching the patch. Wash your hands with soap and water after contact with the patch.

## 2023-09-04 NOTE — Progress Notes (Signed)
Please inform the patient. 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  Must swallow.  Do not eat or drink or rinse for 20-30 minutes thereafter.  Then can rinse with warm water.  Food allergy testing from Dr. Kathyrn Lass office.  Continue PPIs.  Send report to family physician

## 2023-09-04 NOTE — Anesthesia Postprocedure Evaluation (Signed)
Anesthesia Post Note  Patient: Audrey Cannon  Procedure(s) Performed: HARDWARE REMOVAL OF RIGHT KNEE, MEDIAL PROXIMAL TIBIA AND MEDIAL DISTAL TIBIA (Right: Leg Lower)     Patient location during evaluation: PACU Anesthesia Type: General Level of consciousness: awake and alert Pain management: pain level controlled Vital Signs Assessment: post-procedure vital signs reviewed and stable Respiratory status: spontaneous breathing, nonlabored ventilation, respiratory function stable and patient connected to nasal cannula oxygen Cardiovascular status: blood pressure returned to baseline and stable Postop Assessment: no apparent nausea or vomiting Anesthetic complications: no   No notable events documented.  Last Vitals:  Vitals:   09/04/23 1100 09/04/23 1116  BP: (!) 168/112 (!) 153/85  Pulse: 80 80  Resp: 17 16  Temp:  36.6 C  SpO2: 99% 97%    Last Pain:  Vitals:   09/04/23 1116  TempSrc: Temporal  PainSc: 5                  Mariann Barter

## 2023-09-04 NOTE — Anesthesia Procedure Notes (Signed)
Procedure Name: LMA Insertion Date/Time: 09/04/2023 8:55 AM  Performed by: Cleda Clarks, CRNAPre-anesthesia Checklist: Patient identified, Emergency Drugs available, Suction available and Patient being monitored Patient Re-evaluated:Patient Re-evaluated prior to induction Oxygen Delivery Method: Circle system utilized Preoxygenation: Pre-oxygenation with 100% oxygen Induction Type: IV induction Ventilation: Mask ventilation without difficulty LMA: LMA inserted LMA Size: 4.0 Number of attempts: 1 Placement Confirmation: positive ETCO2 Tube secured with: Tape Dental Injury: Teeth and Oropharynx as per pre-operative assessment

## 2023-09-04 NOTE — Anesthesia Preprocedure Evaluation (Signed)
Anesthesia Evaluation  Patient identified by MRN, date of birth, ID band Patient awake    Reviewed: Allergy & Precautions, NPO status , Patient's Chart, lab work & pertinent test results, reviewed documented beta blocker date and time   History of Anesthesia Complications (+) PONV and history of anesthetic complications  Airway Mallampati: II  TM Distance: >3 FB Neck ROM: Full    Dental no notable dental hx.    Pulmonary neg COPD, neg PE   breath sounds clear to auscultation       Cardiovascular hypertension, + Valvular Problems/Murmurs (mod AI) AI  Rhythm:Regular Rate:Normal + Diastolic murmurs Normal LV/RV function   Neuro/Psych  Headaches, neg Seizures PSYCHIATRIC DISORDERS Anxiety Depression Bipolar Disorder    Neuromuscular disease    GI/Hepatic ,GERD  ,,(+) neg Cirrhosis        Endo/Other  Hypothyroidism    Renal/GU Renal disease     Musculoskeletal  (+) Arthritis ,  Fibromyalgia -  Abdominal   Peds  Hematology   Anesthesia Other Findings   Reproductive/Obstetrics                             Anesthesia Physical Anesthesia Plan  ASA: 3  Anesthesia Plan: General   Post-op Pain Management:    Induction: Intravenous  PONV Risk Score and Plan: 3 and Propofol infusion, Dexamethasone and Ondansetron  Airway Management Planned: LMA  Additional Equipment:   Intra-op Plan:   Post-operative Plan: Extubation in OR  Informed Consent: I have reviewed the patients History and Physical, chart, labs and discussed the procedure including the risks, benefits and alternatives for the proposed anesthesia with the patient or authorized representative who has indicated his/her understanding and acceptance.     Dental advisory given  Plan Discussed with: CRNA  Anesthesia Plan Comments:        Anesthesia Quick Evaluation

## 2023-09-05 ENCOUNTER — Other Ambulatory Visit: Payer: Self-pay

## 2023-09-08 ENCOUNTER — Encounter (HOSPITAL_BASED_OUTPATIENT_CLINIC_OR_DEPARTMENT_OTHER): Payer: Self-pay | Admitting: Orthopedic Surgery

## 2023-09-08 NOTE — Plan of Care (Signed)
[facility] Tonsillectomy/Adenoidectomy, Postoperative PEDS care plan entered in error.

## 2023-09-09 ENCOUNTER — Other Ambulatory Visit: Payer: Self-pay

## 2023-09-09 DIAGNOSIS — K200 Eosinophilic esophagitis: Secondary | ICD-10-CM

## 2023-09-09 MED ORDER — FLUTICASONE-SALMETEROL 250-50 MCG/ACT IN AEPB: INHALATION_SPRAY | RESPIRATORY_TRACT | 2 refills | Status: DC

## 2023-10-08 NOTE — Progress Notes (Signed)
Surgery orders requested via Epic inbox. °

## 2023-10-13 ENCOUNTER — Other Ambulatory Visit: Payer: Self-pay | Admitting: Orthopedic Surgery

## 2023-10-13 DIAGNOSIS — G8929 Other chronic pain: Secondary | ICD-10-CM

## 2023-10-13 NOTE — Progress Notes (Signed)
Second request for pre op orders in [facility]: left voicemail at Dr. Tobin Chad office

## 2023-10-14 NOTE — Patient Instructions (Signed)
SURGICAL WAITING ROOM VISITATION  Patients having surgery or a procedure may have no more than 2 support people in the waiting area - these visitors may rotate.    Children under the age of 80 must have an adult with them who is not the patient.  Due to an increase in RSV and influenza rates and associated hospitalizations, children ages 103 and under may not visit patients in [facility] hospitals.  If the patient needs to stay at the hospital during part of their recovery, the visitor guidelines for inpatient rooms apply. Pre-op nurse will coordinate an appropriate time for 1 support person to accompany patient in pre-op.  This support person may not rotate.    Please refer to the [facility] website for the visitor guidelines for Inpatients (after your surgery is over and you are in a regular room).       Your procedure is scheduled on: 10/20/23   Report to [facility] Main Entrance    Report to admitting at  6:50 AM   Call this number if you have problems the morning of surgery [phone]   Do not eat food :After Midnight.   After Midnight you may have the following liquids until 6:20 AM DAY OF SURGERY  Water Non-Citrus Juices (without pulp, NO RED-Apple, White grape, White cranberry) Black Coffee (NO MILK/CREAM OR CREAMERS, sugar ok)  Clear Tea (NO MILK/CREAM OR CREAMERS, sugar ok) regular and decaf                             Plain Jell-O (NO RED)                                           Fruit ices (not with fruit pulp, NO RED)                                     Popsicles (NO RED)                                                               Sports drinks like Gatorade (NO RED)                  The day of surgery:  Drink ONE (1) Pre-Surgery Clear Ensure at 6:20 AM the morning of surgery. Drink in one sitting. Do not sip.  This drink was given to you during your hospital  pre-op appointment visit. Nothing else to drink after completing the  Pre-Surgery Clear  Ensure       Oral Hygiene is also important to reduce your risk of infection.                                    Remember - BRUSH YOUR TEETH THE MORNING OF SURGERY WITH YOUR REGULAR TOOTHPASTE   Stop all vitamins and herbal supplements 7 days before surgery.   Take these medicines the morning of surgery with A SIP OF WATER: Adderall, Zyrtec, Clonidine, Esomeprazole, Gabapentin,  Lamictal, Trileptal, Pantoprazole             You may not have any metal on your body including hair pins, jewelry, and body piercing             Do not wear make-up, lotions, powders, perfumes/cologne, or deodorant  Do not wear nail polish including gel and S&S, artificial/acrylic nails, or any other type of covering on natural nails including finger and toenails. If you have artificial nails, gel coating, etc. that needs to be removed by a nail salon please have this removed prior to surgery or surgery may need to be canceled/ delayed if the surgeon/ anesthesia feels like they are unable to be safely monitored.   Do not shave  48 hours prior to surgery.    Do not bring valuables to the hospital. [facility] IS NOT             RESPONSIBLE   FOR VALUABLES.   Contacts, glasses, dentures or bridgework may not be worn into surgery.  DO NOT BRING YOUR HOME MEDICATIONS TO THE HOSPITAL. PHARMACY WILL DISPENSE MEDICATIONS LISTED ON YOUR MEDICATION LIST TO YOU DURING YOUR ADMISSION IN THE HOSPITAL!    Patients discharged on the day of surgery will not be allowed to drive home.  Someone NEEDS to stay with you for the first 24 hours after anesthesia.   Special Instructions: Bring a copy of your healthcare power of attorney and living will documents the day of surgery if you haven't scanned them before.              Please read over the following fact sheets you were given: IF YOU HAVE QUESTIONS ABOUT YOUR PRE-OP INSTRUCTIONS PLEASE CALL [phone] [facility]   If you received a COVID test during your pre-op visit  it is  requested that you wear a mask when out in public, stay away from anyone that may not be feeling well and notify your surgeon if you develop symptoms. If you test positive for Covid or have been in contact with anyone that has tested positive in the last 10 days please notify you surgeon.      Pre-operative 5 CHG Bath Instructions   You can play a key role in reducing the risk of infection after surgery. Your skin needs to be as free of germs as possible. You can reduce the number of germs on your skin by washing with CHG (chlorhexidine gluconate) soap before surgery. CHG is an antiseptic soap that kills germs and continues to kill germs even after washing.   DO NOT use if you have an allergy to chlorhexidine/CHG or antibacterial soaps. If your skin becomes reddened or irritated, stop using the CHG and notify one of our RNs at [phone].   Please shower with the CHG soap starting 4 days before surgery using the following schedule:     Please keep in mind the following:  DO NOT shave, including legs and underarms, starting the day of your first shower.   You may shave your face at any point before/day of surgery.  Place clean sheets on your bed the day you start using CHG soap. Use a clean washcloth (not used since being washed) for each shower. DO NOT sleep with pets once you start using the CHG.   CHG Shower Instructions:  If you choose to wash your hair and private area, wash first with your normal shampoo/soap.  After you use shampoo/soap, rinse your hair and body thoroughly to remove  shampoo/soap residue.  Turn the water OFF and apply about 3 tablespoons (45 ml) of CHG soap to a CLEAN washcloth.  Apply CHG soap ONLY FROM YOUR NECK DOWN TO YOUR TOES (washing for 3-5 minutes)  DO NOT use CHG soap on face, private areas, open wounds, or sores.  Pay special attention to the area where your surgery is being performed.  If you are having back surgery, having someone wash your back for  you may be helpful. Wait 2 minutes after CHG soap is applied, then you may rinse off the CHG soap.  Pat dry with a clean towel  Put on clean clothes/pajamas   If you choose to wear lotion, please use ONLY the CHG-compatible lotions on the back of this paper.     Additional instructions for the day of surgery: DO NOT APPLY any lotions, deodorants, cologne, or perfumes.   Put on clean/comfortable clothes.  Brush your teeth.  Ask your nurse before applying any prescription medications to the skin.      CHG Compatible Lotions   Aveeno Moisturizing lotion  Cetaphil Moisturizing Cream  Cetaphil Moisturizing Lotion  Clairol Herbal Essence Moisturizing Lotion, Dry Skin  Clairol Herbal Essence Moisturizing Lotion, Extra Dry Skin  Clairol Herbal Essence Moisturizing Lotion, Normal Skin  Curel Age Defying Therapeutic Moisturizing Lotion with Alpha Hydroxy  Curel Extreme Care Body Lotion  Curel Soothing Hands Moisturizing Hand Lotion  Curel Therapeutic Moisturizing Cream, Fragrance-Free  Curel Therapeutic Moisturizing Lotion, Fragrance-Free  Curel Therapeutic Moisturizing Lotion, Original Formula  Eucerin Daily Replenishing Lotion  Eucerin Dry Skin Therapy Plus Alpha Hydroxy Crme  Eucerin Dry Skin Therapy Plus Alpha Hydroxy Lotion  Eucerin Original Crme  Eucerin Original Lotion  Eucerin Plus Crme Eucerin Plus Lotion  Eucerin TriLipid Replenishing Lotion  Keri Anti-Bacterial Hand Lotion  Keri Deep Conditioning Original Lotion Dry Skin Formula Softly Scented  Keri Deep Conditioning Original Lotion, Fragrance Free Sensitive Skin Formula  Keri Lotion Fast Absorbing Fragrance Free Sensitive Skin Formula  Keri Lotion Fast Absorbing Softly Scented Dry Skin Formula  Keri Original Lotion  Keri Skin Renewal Lotion Keri Silky Smooth Lotion  Keri Silky Smooth Sensitive Skin Lotion  Nivea Body Creamy Conditioning Oil  Nivea Body Extra Enriched Lotion  Nivea Body Original Lotion  Nivea Body  Sheer Moisturizing Lotion Nivea Crme  Nivea Skin Firming Lotion  NutraDerm 30 Skin Lotion  NutraDerm Skin Lotion  NutraDerm Therapeutic Skin Cream  NutraDerm Therapeutic Skin Lotion  ProShield Protective Hand Cream   Incentive Spirometer (Watch this video at home: [email or web address])  An incentive spirometer is a tool that can help keep your lungs clear and active. This tool measures how well you are filling your lungs with each breath. Taking long deep breaths may help reverse or decrease the chance of developing breathing (pulmonary) problems (especially infection) following: A long period of time when you are unable to move or be active. BEFORE THE PROCEDURE  If the spirometer includes an indicator to show your best effort, your nurse or respiratory therapist will set it to a desired goal. If possible, sit up straight or lean slightly forward. Try not to slouch. Hold the incentive spirometer in an upright position. INSTRUCTIONS FOR USE  Sit on the edge of your bed if possible, or sit up as far as you can in bed or on a chair. Hold the incentive spirometer in an upright position. Breathe out normally. Place the mouthpiece in your mouth and seal your lips tightly around it. Breathe in slowly and  as deeply as possible, raising the piston or the ball toward the top of the column. Hold your breath for 3-5 seconds or for as long as possible. Allow the piston or ball to fall to the bottom of the column. Remove the mouthpiece from your mouth and breathe out normally. Rest for a few seconds and repeat Steps 1 through 7 at least 10 times every 1-2 hours when you are awake. Take your time and take a few normal breaths between deep breaths. The spirometer may include an indicator to show your best effort. Use the indicator as a goal to work toward during each repetition. After each set of 10 deep breaths, practice coughing to be sure your lungs are clear. If you have an  incision (the cut made at the time of surgery), support your incision when coughing by placing a pillow or rolled up towels firmly against it. Once you are able to get out of bed, walk around indoors and cough well. You may stop using the incentive spirometer when instructed by your caregiver.  RISKS AND COMPLICATIONS Take your time so you do not get dizzy or light-headed. If you are in pain, you may need to take or ask for pain medication before doing incentive spirometry. It is harder to take a deep breath if you are having pain. AFTER USE Rest and breathe slowly and easily. It can be helpful to keep track of a log of your progress. Your caregiver can provide you with a simple table to help with this. If you are using the spirometer at home, follow these instructions: SEEK MEDICAL CARE IF:  You are having difficultly using the spirometer. You have trouble using the spirometer as often as instructed. Your pain medication is not giving enough relief while using the spirometer. You develop fever of 100.5 F (38.1 C) or higher. SEEK IMMEDIATE MEDICAL CARE IF:  You cough up bloody sputum that had not been present before. You develop fever of 102 F (38.9 C) or greater. You develop worsening pain at or near the incision site. MAKE SURE YOU:  Understand these instructions. Will watch your condition. Will get help right away if you are not doing well or get worse. Document Released: 03/17/2007 Document Revised: 01/27/2012 Document Reviewed: 05/18/2007 [facility] Patient Information 2014 [place], [place].

## 2023-10-14 NOTE — Progress Notes (Signed)
COVID Vaccine received:  []  No [x]  Yes Date of any COVID positive Test in last 90 days: no PCP - Renaldo Nodal PA-C Cardiologist - DR. Ravenkar in [place]  Chest x-ray -  EKG -  07/16/23 Epic Stress Test -  ECHO - 08/15/23 Epic Cardiac Cath -   Bowel Prep - [x]  No  []   Yes ______  Pacemaker / ICD device [x]  No []  Yes   Spinal Cord Stimulator:[x]  No []  Yes       History of Sleep Apnea? [x]  No []  Yes   CPAP used?- [x]  No []  Yes    Does the patient monitor blood sugar?          [x]  No []  Yes  []  N/A  Patient has: [x]  NO Hx DM   []  Pre-DM                 []  DM1  []   DM2 Does patient have a [employer] or Dexacom? []  No []  Yes   Fasting Blood Sugar Ranges-  Checks Blood Sugar _____ times a day  GLP1 agonist / usual dose - no GLP1 instructions:  SGLT-2 inhibitors / usual dose - no SGLT-2 instructions:   Blood Thinner / Instructions:no Aspirin Instructions:no  Comments:   Activity level: Patient is unable to climb a flight of stairs without difficulty; [x]  No CP  [x]  No SOB, but would have __Knee pain_   Patient  can not perform ADLs without assistance.  Thoracic aortic aneurysm requiring open heart surgery. HTN, Mild aortic regurg., [place] chiari malformation  Patient denies shortness of breath, fever, cough and chest pain at PAT appointment.  Patient verbalized understanding and agreement to the Pre-Surgical Instructions that were given to them at this PAT appointment. Patient was also educated of the need to review these PAT instructions again prior to his/her surgery.I reviewed the appropriate phone numbers to call if they have any and questions or concerns.

## 2023-10-15 ENCOUNTER — Other Ambulatory Visit: Payer: Self-pay

## 2023-10-15 ENCOUNTER — Encounter (HOSPITAL_COMMUNITY): Payer: Self-pay

## 2023-10-15 ENCOUNTER — Encounter (HOSPITAL_COMMUNITY)
Admission: RE | Admit: 2023-10-15 | Discharge: 2023-10-15 | Disposition: A | Discharge status: Home / Self Care | Payer: Managed Care, Other (non HMO) | Source: Ambulatory Visit | Attending: Orthopedic Surgery | Admitting: Orthopedic Surgery

## 2023-10-15 VITALS — BP 131/93 | HR 97 | Temp 98.3°F | Resp 16 | Ht 64.0 in | Wt 213.0 lb

## 2023-10-15 DIAGNOSIS — I082 Rheumatic disorders of both aortic and tricuspid valves: Secondary | ICD-10-CM | POA: Diagnosis not present

## 2023-10-15 DIAGNOSIS — Z87820 Personal history of traumatic brain injury: Secondary | ICD-10-CM | POA: Insufficient documentation

## 2023-10-15 DIAGNOSIS — Z86718 Personal history of other venous thrombosis and embolism: Secondary | ICD-10-CM | POA: Diagnosis not present

## 2023-10-15 DIAGNOSIS — M25562 Pain in left knee: Secondary | ICD-10-CM | POA: Insufficient documentation

## 2023-10-15 DIAGNOSIS — M069 Rheumatoid arthritis, unspecified: Secondary | ICD-10-CM | POA: Diagnosis not present

## 2023-10-15 DIAGNOSIS — M797 Fibromyalgia: Secondary | ICD-10-CM | POA: Diagnosis not present

## 2023-10-15 DIAGNOSIS — G8929 Other chronic pain: Secondary | ICD-10-CM | POA: Insufficient documentation

## 2023-10-15 DIAGNOSIS — Z01812 Encounter for preprocedural laboratory examination: Secondary | ICD-10-CM | POA: Diagnosis present

## 2023-10-15 DIAGNOSIS — K219 Gastro-esophageal reflux disease without esophagitis: Secondary | ICD-10-CM | POA: Diagnosis not present

## 2023-10-15 DIAGNOSIS — F1120 Opioid dependence, uncomplicated: Secondary | ICD-10-CM | POA: Diagnosis not present

## 2023-10-15 DIAGNOSIS — I10 Essential (primary) hypertension: Secondary | ICD-10-CM | POA: Insufficient documentation

## 2023-10-15 DIAGNOSIS — Z01818 Encounter for other preprocedural examination: Secondary | ICD-10-CM

## 2023-10-15 LAB — COMPREHENSIVE METABOLIC PANEL
ALT: 21 U/L (ref 0–44)
AST: 20 U/L (ref 15–41)
Albumin: 4 g/dL (ref 3.5–5.0)
Alkaline Phosphatase: 137 U/L — ABNORMAL HIGH (ref 38–126)
Anion gap: 9 (ref 5–15)
BUN: 21 mg/dL — ABNORMAL HIGH (ref 6–20)
CO2: 28 mmol/L (ref 22–32)
Calcium: 9.2 mg/dL (ref 8.9–10.3)
Chloride: 94 mmol/L — ABNORMAL LOW (ref 98–111)
Creatinine, Ser: 0.78 mg/dL (ref 0.44–1.00)
GFR, Estimated: >60 mL/min (ref >60)
Glucose, Bld: 101 mg/dL — ABNORMAL HIGH (ref 70–99)
Potassium: 4.1 mmol/L (ref 3.5–5.1)
Sodium: 131 mmol/L — ABNORMAL LOW (ref 135–145)
Total Bilirubin: 0.4 mg/dL (ref <1.2)
Total Protein: 6.9 g/dL (ref 6.5–8.1)

## 2023-10-15 LAB — CBC WITH DIFFERENTIAL/PLATELET
Abs Immature Granulocytes: 0 10*3/uL (ref 0.00–0.07)
Basophils Absolute: 0 10*3/uL (ref 0.0–0.1)
Basophils Relative: 0 %
Eosinophils Absolute: 0.3 10*3/uL (ref 0.0–0.5)
Eosinophils Relative: 2 %
HCT: 43.9 % (ref 36.0–46.0)
Hemoglobin: 15 g/dL (ref 12.0–15.0)
Lymphocytes Relative: 39 %
Lymphs Abs: 6.2 10*3/uL — ABNORMAL HIGH (ref 0.7–4.0)
MCH: 30.7 pg (ref 26.0–34.0)
MCHC: 34.2 g/dL (ref 30.0–36.0)
MCV: 89.8 fL (ref 80.0–100.0)
Monocytes Absolute: 2.4 10*3/uL — ABNORMAL HIGH (ref 0.1–1.0)
Monocytes Relative: 15 %
Neutro Abs: 7 10*3/uL (ref 1.7–7.7)
Neutrophils Relative %: 44 %
Platelets: 358 10*3/uL (ref 150–400)
RBC: 4.89 MIL/uL (ref 3.87–5.11)
RDW: 12.7 % (ref 11.5–15.5)
WBC: 15.9 10*3/uL — ABNORMAL HIGH (ref 4.0–10.5)
nRBC: 0 % (ref 0.0–0.2)

## 2023-10-15 LAB — SURGICAL PCR SCREEN
MRSA, PCR: NEGATIVE
Staphylococcus aureus: NEGATIVE

## 2023-10-15 NOTE — Progress Notes (Signed)
DISCUSSION: Audrey Cannon is a 57 yo female who presents to PAT prior to L TKA on 10/20/23 with Dr. Sherlean Foot. 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  Prior anesthesia complications include PONV. Patient also states that she has severe depression after she wakes up from surgery. She states that her psychiatrist recommended potentially giving ketamine to help during her surgery.  Patient follows with Cardiology for hx of TAA repair in 2015 and moderate AR. TAA repair was reportedly done in [place] years ago. Patient was last seen on 07/16/23 for preop exam. She reported DOE and was recommended to undergo a CTA of coronaries. This was normal and echo was done which showed normal EF and moderate AR. She was cleared for surgery: "Patient CT scan was unremarkable. Echocardiogram was unremarkable except moderate aortic regurgitation. Based on these patient is not at high risk for coronary events during the aforementioned surgery. Medical hemodynamic monitoring will further reduce the risk of coronary events."  Patient follows with Rheumatology for RA. She takes Humira. Last seen on 06/11/23 and per Dr. Deanne Coffer: "she is plannign for knee surgery in the future, advised to hold humira at 2 weeks before and 3 weeks after the surgery. "  Of note patient's WBC was 15.9. Differential with increase in lymphocytes and monocytes. Message relayed to scheduler for Dr. Tobin Chad office who will let surgeon or his PA know  VS: BP (!) 131/93   Pulse 97   Temp 36.8 C (Oral)   Resp 16   Ht 5\' 4"  (1.626 m)   Wt 96.6 kg   SpO2 100%   BMI 36.56 kg/m   PROVIDERS: Nodal, Joline Salt, PA-C Cardiology: Belva Crome, MD  LABS: Labs reviewed: Repeat CBC.  (all labs ordered are listed, but only abnormal results are displayed)  Labs Reviewed   CBC WITH DIFFERENTIAL/PLATELET - Abnormal; Notable for the following components:      Result Value   WBC 15.9 (*)    All other components within normal limits  SURGICAL PCR SCREEN  COMPREHENSIVE METABOLIC PANEL     IMAGES:   EKG:   CV:  CTA coronaries 08/01/23:  IMPRESSION: 1. Coronary calcium score of 0.   2. Normal coronary origin with right dominance.   3. Normal coronary arteries.   4. S/p aortic ascending repair. 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   5. Dilated pulmonary artery suggestive of pulmonary hypertension.   RECOMMENDATIONS: 1. No evidence of CAD (0%). Consider non-atherosclerotic causes of chest pain.  Echo 08/15/23:  IMPRESSIONS    1. Aortic dilatation noted. There is mild dilatation of the aortic root, measuring 40 mm. The ascending aorta has been surgically replaced.  2. Left ventricular ejection fraction, by estimation, is 55 to 60%. The left ventricle has normal function. The left ventricle has no regional wall motion abnormalities. Left ventricular diastolic parameters are consistent with Grade I diastolic dysfunction (impaired relaxation).  3. Right ventricular systolic function is normal. The right ventricular size is normal. Tricuspid regurgitation signal is inadequate for assessing PA pressure.  4. The aortic valve is tricuspid. Aortic valve regurgitation is moderate. No aortic stenosis is present.  5. The mitral valve is normal in structure. No evidence of mitral valve regurgitation. No evidence of mitral stenosis.  6. The inferior vena cava is normal in size with greater than 50% respiratory variability,  suggesting right atrial pressure of 3 mmHg.  Right heart cath 03/19/2019 ([facility]):  Impressions:   Normal coronary arteries.  Normal right heart pressures.  Normal cardiac output.   Recommendations:   No cardiac abnormalities identified.   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 DAVID DOMINGUEZ, NP ([phone]) IN [facility] of this note might be different from the original. 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 C5-6 fusion 10/2010 [facility];(hx [facility] BOTOX INJECTIONS, CHIROPRACTER, MASSAGE [place]) Formatting of this note might be different from the original. C5-6 fusion 10/2010 [facility];(hx [place] [facility] BOTOX INJECTIONS, CHIROPRACTER, MASSAGE [place])   Dysphagia 05/19/2023   Elevated white blood cell count, unspecified 07/16/2016   Fibromyalgia 06/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 colonoscopy 2007 negative, repeat Nov 2011 negative (fam hx, repeat 2016)   High risk medication use 04/18/2022   Hypertension    Hyponatremia 01/15/2019   Hypothyroidism 09/10/2011   MDD (major depressive disorder), recurrent episode, severe (HCC) 03/16/2019   Migraine 06/26/2009   Mixed  dyslipidemia 03/17/2020   Morbid obesity (HCC) 04/19/2022   Opioid-induced hyperalgesia 04/18/2022   Oral aphthae 08/15/2011   Polyarthralgia 12/23/2018   PONV (postoperative nausea and vomiting)    Post concussion syndrome 03/16/2019   Post traumatic stress disorder (PTSD) 03/16/2019   Pyelonephritis 04/18/2022   Recurrent major depressive disorder, in partial remission (HCC) 09/01/2017   Rheumatoid arthritis (HCC) 04/18/2022   States associated with artificial menopause 06/26/2009   TBI (traumatic brain injury) (HCC) 11/01/2015   Thoracic aortic aneurysm without rupture (HCC) 09/01/2017   Formatting of this note might be different from the original. H/o repair 2/15 outside records indicate moderate aortic regurgitation and aneurysm measuring 4.6 cm by CT.  Patient had significant dyspnea on exertion and had replacement of the ascending aorta with 26 Hemashield graft.  The aortic valve was "healthy "with minor calcification small median raphae with slight right-left cusp fusion but    Unspecified osteoarthritis, unspecified site 06/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 R knee microfracture surgery/lateral patellar release 01/2013 [facility]   Upper GI bleeding 06/26/2009    Past Surgical History:  Procedure Laterality Date   APPENDECTOMY  1993   WITH HYSTERECTOMY   BIOPSY  08/28/2023   Procedure: BIOPSY;  Surgeon: Lynann Bologna, MD;  Location: [facility] ENDOSCOPY;  Service: Gastroenterology;;   CARDIAC VALVE SURGERY  2015   Ascending aortic anuerysm   Discectomy Fusion C3/4  03/2010   ESOPHAGOGASTRODUODENOSCOPY  09/03/2016   [facility]   ESOPHAGOGASTRODUODENOSCOPY (EGD) WITH PROPOFOL N/A 08/28/2023   Procedure: ESOPHAGOGASTRODUODENOSCOPY (EGD) WITH PROPOFOL;  Surgeon: Lynann Bologna, MD;  Location[facility];  Service: Gastroenterology;  Laterality: N/A;   HARDWARE REMOVAL Right 09/04/2023   Procedure: HARDWARE REMOVAL  OF RIGHT KNEE, MEDIAL PROXIMAL TIBIA AND MEDIAL DISTAL TIBIA;  Surgeon: Toni Arthurs, MD;  Location: [facility];  Service: Orthopedics;  Laterality: Right;   KNEE ARTHROSCOPY Right 2004   KNEE ARTHROSCOPY Left 2014   MALONEY DILATION  08/28/2023   Procedure: MALONEY DILATION;  Surgeon: Lynann Bologna, MD;  Location: [facility] ENDOSCOPY;  Service: Gastroenterology;;   SPINAL CORD STIMULATOR REMOVAL  04/2023   TIBIA IM NAIL INSERTION Right 07/19/2021   Procedure: 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;  Surgeon: Toni Arthurs, MD;  Location: [facility];  Service: Orthopedics;  Laterality: Right;   TONSILLECTOMY  1976    MEDICATIONS:  acyclovir (ZOVIRAX) 400 MG tablet   amphetamine-dextroamphetamine (ADDERALL) 30 MG tablet   cetirizine (ZYRTEC) 10 MG tablet   cloNIDine (CATAPRES) 0.1 MG tablet   cyclobenzaprine (FLEXERIL) 10 MG tablet   dicyclomine (BENTYL) 10 MG capsule   Doxepin HCl 6 MG TABS   esomeprazole (NEXIUM) 20 MG capsule   gabapentin (NEURONTIN) 300 MG capsule   HUMIRA PEN 40 MG/0.4ML PNKT   hydrOXYzine (VISTARIL) 50 MG capsule   Ketorolac Tromethamine (SPRIX) 15.75 MG/SPRAY SOLN   lamoTRIgine (LAMICTAL) 200 MG tablet   lidocaine (LIDODERM) 5 %   naloxone (NARCAN) nasal spray 4 mg/0.1 mL   ondansetron (ZOFRAN) 8 MG tablet   Oxcarbazepine (TRILEPTAL) 300 MG tablet   Oxycodone HCl 10 MG TABS   pantoprazole (PROTONIX) 40 MG tablet   phentermine (ADIPEX-P) 37.5 MG tablet   promethazine (PHENERGAN) 25 MG tablet   traZODone (DESYREL) 150 MG tablet   Vitamin D, Ergocalciferol, (DRISDOL) 1.25 MG (50000 UNIT) CAPS capsule   XTAMPZA ER 36 MG C12A   zolpidem (AMBIEN) 10 MG tablet   No current facility-administered medications for this encounter.   Marcille Blanco [facility] Surgical Short Stay/Anesthesiology [facility] Phone [phone] 10/15/2023 10:25 AM

## 2023-10-20 ENCOUNTER — Encounter (HOSPITAL_COMMUNITY): Payer: Self-pay | Admitting: Orthopedic Surgery

## 2023-10-20 ENCOUNTER — Other Ambulatory Visit: Payer: Self-pay

## 2023-10-20 ENCOUNTER — Ambulatory Visit (HOSPITAL_COMMUNITY): Payer: Managed Care, Other (non HMO) | Admitting: Anesthesiology

## 2023-10-20 ENCOUNTER — Encounter (HOSPITAL_COMMUNITY)
Admission: RE | Disposition: A | Discharge status: Home / Self Care | Payer: Self-pay | Source: Home / Self Care | Attending: Orthopedic Surgery

## 2023-10-20 ENCOUNTER — Observation Stay (HOSPITAL_COMMUNITY)
Admission: RE | Admit: 2023-10-20 | Discharge: 2023-10-21 | Disposition: A | Discharge status: Home / Self Care | Payer: Managed Care, Other (non HMO) | Attending: Orthopedic Surgery | Admitting: Orthopedic Surgery

## 2023-10-20 DIAGNOSIS — M1712 Unilateral primary osteoarthritis, left knee: Secondary | ICD-10-CM

## 2023-10-20 DIAGNOSIS — I10 Essential (primary) hypertension: Secondary | ICD-10-CM | POA: Insufficient documentation

## 2023-10-20 DIAGNOSIS — Z79899 Other long term (current) drug therapy: Secondary | ICD-10-CM | POA: Insufficient documentation

## 2023-10-20 DIAGNOSIS — E039 Hypothyroidism, unspecified: Secondary | ICD-10-CM | POA: Diagnosis not present

## 2023-10-20 DIAGNOSIS — Z96659 Presence of unspecified artificial knee joint: Principal | ICD-10-CM

## 2023-10-20 HISTORY — PX: TOTAL KNEE ARTHROPLASTY: SHX125

## 2023-10-20 SURGERY — ARTHROPLASTY, KNEE, TOTAL
Anesthesia: Spinal | Site: Knee | Laterality: Left

## 2023-10-20 MED ORDER — LAMOTRIGINE 100 MG PO TABS
200.0000 mg | ORAL_TABLET | Freq: Two times a day (BID) | ORAL | Status: DC
  Administered 2023-10-20 – 2023-10-21 (×2): 200 mg via ORAL
  Filled 2023-10-20 (×2): qty 2

## 2023-10-20 MED ORDER — PANTOPRAZOLE SODIUM 40 MG PO TBEC
40.0000 mg | DELAYED_RELEASE_TABLET | Freq: Two times a day (BID) | ORAL | Status: DC
  Administered 2023-10-20 – 2023-10-21 (×2): 40 mg via ORAL
  Filled 2023-10-20 (×2): qty 1

## 2023-10-20 MED ORDER — HYDROMORPHONE HCL 2 MG PO TABS
2.0000 mg | ORAL_TABLET | ORAL | Status: DC | PRN
  Administered 2023-10-20 – 2023-10-21 (×3): 2 mg via ORAL
  Filled 2023-10-20 (×3): qty 1

## 2023-10-20 MED ORDER — WATER FOR IRRIGATION, STERILE IR SOLN
Status: DC | PRN
  Administered 2023-10-20: 1000 mL

## 2023-10-20 MED ORDER — CHLORHEXIDINE GLUCONATE 0.12 % MT SOLN
15.0000 mL | Freq: Once | OROMUCOSAL | Status: AC
  Administered 2023-10-20: 15 mL via OROMUCOSAL

## 2023-10-20 MED ORDER — OXYCODONE HCL 5 MG/5ML PO SOLN: 5.0000 mg | Freq: Once | ORAL | Status: DC | PRN

## 2023-10-20 MED ORDER — HYDROMORPHONE HCL 1 MG/ML IJ SOLN
0.5000 mg | INTRAMUSCULAR | Status: DC | PRN
Start: 2023-10-20 — End: 2023-10-21

## 2023-10-20 MED ORDER — ONDANSETRON HCL 4 MG/2ML IJ SOLN
INTRAMUSCULAR | Status: AC
  Filled 2023-10-20: qty 2

## 2023-10-20 MED ORDER — PHENOL 1.4 % MT LIQD
1.0000 | OROMUCOSAL | Status: DC | PRN
  Administered 2023-10-21: 1 via OROMUCOSAL
  Filled 2023-10-20: qty 177

## 2023-10-20 MED ORDER — SODIUM CHLORIDE (PF) 0.9 % IJ SOLN
INTRAMUSCULAR | Status: DC | PRN
  Administered 2023-10-20: 70 mL

## 2023-10-20 MED ORDER — SODIUM CHLORIDE 0.9 % IR SOLN
Status: DC | PRN
  Administered 2023-10-20: 1000 mL

## 2023-10-20 MED ORDER — MIDAZOLAM HCL 2 MG/2ML IJ SOLN
INTRAMUSCULAR | Status: AC
  Filled 2023-10-20: qty 2

## 2023-10-20 MED ORDER — ACETAMINOPHEN 10 MG/ML IV SOLN: 1000.0000 mg | Freq: Once | INTRAVENOUS | Status: DC | PRN

## 2023-10-20 MED ORDER — DIPHENHYDRAMINE HCL 12.5 MG/5ML PO ELIX: 12.5000 mg | ORAL_SOLUTION | ORAL | Status: DC | PRN

## 2023-10-20 MED ORDER — OXYCODONE HCL ER 20 MG PO T12A
40.0000 mg | EXTENDED_RELEASE_TABLET | Freq: Two times a day (BID) | ORAL | Status: DC
  Administered 2023-10-20 – 2023-10-21 (×2): 40 mg via ORAL
  Filled 2023-10-20 (×2): qty 2

## 2023-10-20 MED ORDER — ASPIRIN 81 MG PO CHEW
81.0000 mg | CHEWABLE_TABLET | Freq: Two times a day (BID) | ORAL | Status: DC
  Administered 2023-10-21: 81 mg via ORAL
  Filled 2023-10-20: qty 1

## 2023-10-20 MED ORDER — FENTANYL CITRATE PF 50 MCG/ML IJ SOSY
50.0000 ug | PREFILLED_SYRINGE | INTRAMUSCULAR | Status: DC
  Filled 2023-10-20: qty 2

## 2023-10-20 MED ORDER — PHENYLEPHRINE 80 MCG/ML (10ML) SYRINGE FOR IV PUSH (FOR BLOOD PRESSURE SUPPORT)
PREFILLED_SYRINGE | INTRAVENOUS | Status: AC
  Filled 2023-10-20: qty 10

## 2023-10-20 MED ORDER — OXCARBAZEPINE 150 MG PO TABS
300.0000 mg | ORAL_TABLET | Freq: Two times a day (BID) | ORAL | Status: DC
  Administered 2023-10-20 – 2023-10-21 (×2): 300 mg via ORAL
  Filled 2023-10-20 (×2): qty 2

## 2023-10-20 MED ORDER — 0.9 % SODIUM CHLORIDE (POUR BTL) OPTIME
TOPICAL | Status: DC | PRN
  Administered 2023-10-20: 1000 mL

## 2023-10-20 MED ORDER — ROPIVACAINE HCL 5 MG/ML IJ SOLN
INTRAMUSCULAR | Status: DC | PRN
  Administered 2023-10-20: 20 mL via PERINEURAL

## 2023-10-20 MED ORDER — BUPIVACAINE LIPOSOME 1.3 % IJ SUSP: 20.0000 mL | Freq: Once | INTRAMUSCULAR | Status: DC

## 2023-10-20 MED ORDER — SENNOSIDES-DOCUSATE SODIUM 8.6-50 MG PO TABS
1.0000 | ORAL_TABLET | Freq: Every evening | ORAL | Status: DC | PRN
Start: 2023-10-20 — End: 2023-10-21

## 2023-10-20 MED ORDER — GLYCOPYRROLATE 0.2 MG/ML IJ SOLN
INTRAMUSCULAR | Status: DC | PRN
  Administered 2023-10-20: .2 mg via INTRAVENOUS

## 2023-10-20 MED ORDER — PROMETHAZINE HCL 25 MG PO TABS: 25.0000 mg | ORAL_TABLET | Freq: Four times a day (QID) | ORAL | Status: DC | PRN

## 2023-10-20 MED ORDER — DEXAMETHASONE SODIUM PHOSPHATE 10 MG/ML IJ SOLN
8.0000 mg | Freq: Once | INTRAMUSCULAR | Status: AC
  Administered 2023-10-20: 10 mg via INTRAVENOUS

## 2023-10-20 MED ORDER — LACTATED RINGERS IV SOLN: INTRAVENOUS | Status: DC

## 2023-10-20 MED ORDER — ORAL CARE MOUTH RINSE: 15.0000 mL | Freq: Once | OROMUCOSAL | Status: AC

## 2023-10-20 MED ORDER — GLYCOPYRROLATE 0.2 MG/ML IJ SOLN
INTRAMUSCULAR | Status: AC
Start: 2023-10-20 — End: ?
  Filled 2023-10-20: qty 1

## 2023-10-20 MED ORDER — GABAPENTIN 300 MG PO CAPS
300.0000 mg | ORAL_CAPSULE | Freq: Once | ORAL | Status: AC
  Administered 2023-10-20: 300 mg via ORAL
  Filled 2023-10-20: qty 1

## 2023-10-20 MED ORDER — ALUM & MAG HYDROXIDE-SIMETH 200-200-20 MG/5ML PO SUSP: 30.0000 mL | ORAL | Status: DC | PRN

## 2023-10-20 MED ORDER — ZOLPIDEM TARTRATE 5 MG PO TABS
5.0000 mg | ORAL_TABLET | Freq: Every day | ORAL | Status: DC
  Administered 2023-10-20: 5 mg via ORAL
  Filled 2023-10-20: qty 1

## 2023-10-20 MED ORDER — POVIDONE-IODINE 10 % EX SWAB: 2.0000 | Freq: Once | CUTANEOUS | Status: DC

## 2023-10-20 MED ORDER — PHENYLEPHRINE HCL (PRESSORS) 10 MG/ML IV SOLN
INTRAVENOUS | Status: DC | PRN
  Administered 2023-10-20: 120 ug via INTRAVENOUS
  Administered 2023-10-20: 40 ug via INTRAVENOUS
  Administered 2023-10-20 (×3): 80 ug via INTRAVENOUS

## 2023-10-20 MED ORDER — BUPIVACAINE-EPINEPHRINE 0.25% -1:200000 IJ SOLN
INTRAMUSCULAR | Status: AC
  Filled 2023-10-20: qty 1

## 2023-10-20 MED ORDER — FLEET ENEMA RE ENEM: 1.0000 | ENEMA | Freq: Once | RECTAL | Status: DC | PRN

## 2023-10-20 MED ORDER — OXYCODONE HCL 5 MG PO TABS: 5.0000 mg | ORAL_TABLET | Freq: Once | ORAL | Status: DC | PRN

## 2023-10-20 MED ORDER — TRAZODONE HCL 50 MG PO TABS
150.0000 mg | ORAL_TABLET | Freq: Every day | ORAL | Status: DC
  Administered 2023-10-20: 150 mg via ORAL
  Filled 2023-10-20: qty 1

## 2023-10-20 MED ORDER — ONDANSETRON HCL 4 MG/2ML IJ SOLN
INTRAMUSCULAR | Status: DC | PRN
  Administered 2023-10-20: 4 mg via INTRAVENOUS

## 2023-10-20 MED ORDER — MENTHOL 3 MG MT LOZG: 1.0000 | LOZENGE | OROMUCOSAL | Status: DC | PRN

## 2023-10-20 MED ORDER — METHOCARBAMOL 500 MG PO TABS
500.0000 mg | ORAL_TABLET | Freq: Four times a day (QID) | ORAL | Status: DC | PRN
  Administered 2023-10-20: 500 mg via ORAL
  Filled 2023-10-20: qty 1

## 2023-10-20 MED ORDER — HYDROMORPHONE HCL 1 MG/ML IJ SOLN: 0.5000 mg | INTRAMUSCULAR | Status: DC | PRN

## 2023-10-20 MED ORDER — BUPIVACAINE IN DEXTROSE 0.75-8.25 % IT SOLN
INTRATHECAL | Status: DC | PRN
Start: 2023-10-20 — End: 2023-10-20
  Administered 2023-10-20: 1.8 mL via INTRATHECAL

## 2023-10-20 MED ORDER — DEXAMETHASONE SODIUM PHOSPHATE 10 MG/ML IJ SOLN
10.0000 mg | Freq: Once | INTRAMUSCULAR | Status: AC
  Administered 2023-10-21: 10 mg via INTRAVENOUS
  Filled 2023-10-20: qty 1

## 2023-10-20 MED ORDER — KETAMINE HCL 10 MG/ML IJ SOLN
INTRAMUSCULAR | Status: DC | PRN
  Administered 2023-10-20 (×2): 10 mg via INTRAVENOUS

## 2023-10-20 MED ORDER — MIDAZOLAM HCL 2 MG/2ML IJ SOLN
INTRAMUSCULAR | Status: DC | PRN
  Administered 2023-10-20: 2 mg via INTRAVENOUS

## 2023-10-20 MED ORDER — DEXAMETHASONE SODIUM PHOSPHATE 10 MG/ML IJ SOLN
INTRAMUSCULAR | Status: AC
  Filled 2023-10-20: qty 1

## 2023-10-20 MED ORDER — DOCUSATE SODIUM 100 MG PO CAPS
100.0000 mg | ORAL_CAPSULE | Freq: Two times a day (BID) | ORAL | Status: DC
  Administered 2023-10-20 – 2023-10-21 (×2): 100 mg via ORAL
  Filled 2023-10-20 (×2): qty 1

## 2023-10-20 MED ORDER — PANTOPRAZOLE SODIUM 40 MG PO TBEC: 40.0000 mg | DELAYED_RELEASE_TABLET | Freq: Every day | ORAL | Status: DC

## 2023-10-20 MED ORDER — ONDANSETRON HCL 4 MG/2ML IJ SOLN: 4.0000 mg | Freq: Once | INTRAMUSCULAR | Status: DC | PRN

## 2023-10-20 MED ORDER — VANCOMYCIN HCL IN DEXTROSE 1-5 GM/200ML-% IV SOLN
1000.0000 mg | Freq: Two times a day (BID) | INTRAVENOUS | Status: AC
  Administered 2023-10-20: 1000 mg via INTRAVENOUS
  Filled 2023-10-20: qty 200

## 2023-10-20 MED ORDER — VANCOMYCIN HCL IN DEXTROSE 1-5 GM/200ML-% IV SOLN
1000.0000 mg | INTRAVENOUS | Status: AC
  Administered 2023-10-20: 1000 mg via INTRAVENOUS
  Filled 2023-10-20: qty 200

## 2023-10-20 MED ORDER — MIDAZOLAM HCL 2 MG/2ML IJ SOLN
1.0000 mg | INTRAMUSCULAR | Status: AC
Start: 2023-10-20 — End: 2023-10-20
  Administered 2023-10-20: 1 mg via INTRAVENOUS
  Filled 2023-10-20 (×2): qty 2

## 2023-10-20 MED ORDER — SODIUM CHLORIDE (PF) 0.9 % IJ SOLN
INTRAMUSCULAR | Status: AC
  Filled 2023-10-20: qty 20

## 2023-10-20 MED ORDER — ACETAMINOPHEN 500 MG PO TABS
1000.0000 mg | ORAL_TABLET | Freq: Once | ORAL | Status: AC
  Administered 2023-10-20: 1000 mg via ORAL
  Filled 2023-10-20: qty 2

## 2023-10-20 MED ORDER — ACYCLOVIR 400 MG PO TABS
400.0000 mg | ORAL_TABLET | Freq: Two times a day (BID) | ORAL | Status: DC
  Administered 2023-10-20 – 2023-10-21 (×3): 400 mg via ORAL
  Filled 2023-10-20 (×3): qty 1

## 2023-10-20 MED ORDER — KETAMINE HCL 50 MG/5ML IJ SOSY
PREFILLED_SYRINGE | INTRAMUSCULAR | Status: AC
  Filled 2023-10-20: qty 5

## 2023-10-20 MED ORDER — FERROUS SULFATE 325 (65 FE) MG PO TABS
325.0000 mg | ORAL_TABLET | Freq: Three times a day (TID) | ORAL | Status: DC
  Administered 2023-10-21: 325 mg via ORAL
  Filled 2023-10-20: qty 1

## 2023-10-20 MED ORDER — BUPIVACAINE LIPOSOME 1.3 % IJ SUSP
INTRAMUSCULAR | Status: AC
Start: 2023-10-20 — End: ?
  Filled 2023-10-20: qty 20

## 2023-10-20 MED ORDER — DEXAMETHASONE SODIUM PHOSPHATE 10 MG/ML IJ SOLN
INTRAMUSCULAR | Status: DC | PRN
Start: 2023-10-20 — End: 2023-10-20
  Administered 2023-10-20: 10 mg

## 2023-10-20 MED ORDER — TRANEXAMIC ACID-NACL 1000-0.7 MG/100ML-% IV SOLN
1000.0000 mg | INTRAVENOUS | Status: AC
  Administered 2023-10-20: 1000 mg via INTRAVENOUS
  Filled 2023-10-20: qty 100

## 2023-10-20 MED ORDER — PROPOFOL 1000 MG/100ML IV EMUL
INTRAVENOUS | Status: AC
  Filled 2023-10-20: qty 100

## 2023-10-20 MED ORDER — CLONIDINE HCL 0.1 MG PO TABS
0.2000 mg | ORAL_TABLET | Freq: Every day | ORAL | Status: DC
Start: 2023-10-20 — End: 2023-10-21
  Administered 2023-10-20: 0.2 mg via ORAL
  Filled 2023-10-20: qty 2

## 2023-10-20 MED ORDER — GABAPENTIN 300 MG PO CAPS
900.0000 mg | ORAL_CAPSULE | Freq: Every day | ORAL | Status: DC
Start: 2023-10-20 — End: 2023-10-21
  Administered 2023-10-20: 900 mg via ORAL
  Filled 2023-10-20: qty 3

## 2023-10-20 MED ORDER — METHOCARBAMOL 1000 MG/10ML IJ SOLN: 500.0000 mg | Freq: Four times a day (QID) | INTRAMUSCULAR | Status: DC | PRN

## 2023-10-20 MED ORDER — SODIUM CHLORIDE 0.9 % IV SOLN: INTRAVENOUS | Status: DC

## 2023-10-20 MED ORDER — PROPOFOL 500 MG/50ML IV EMUL
INTRAVENOUS | Status: DC | PRN
  Administered 2023-10-20: 50 ug/kg/min via INTRAVENOUS

## 2023-10-20 MED ORDER — HYDROXYZINE HCL 25 MG PO TABS
50.0000 mg | ORAL_TABLET | Freq: Four times a day (QID) | ORAL | Status: DC
  Administered 2023-10-20 – 2023-10-21 (×4): 50 mg via ORAL
  Filled 2023-10-20 (×4): qty 2

## 2023-10-20 MED ORDER — FENTANYL CITRATE PF 50 MCG/ML IJ SOSY: 25.0000 ug | PREFILLED_SYRINGE | INTRAMUSCULAR | Status: DC | PRN

## 2023-10-20 MED ORDER — BISACODYL 5 MG PO TBEC: 5.0000 mg | DELAYED_RELEASE_TABLET | Freq: Every day | ORAL | Status: DC | PRN

## 2023-10-20 MED ORDER — ACETAMINOPHEN 500 MG PO TABS
1000.0000 mg | ORAL_TABLET | Freq: Four times a day (QID) | ORAL | Status: AC
  Administered 2023-10-20 – 2023-10-21 (×4): 1000 mg via ORAL
  Filled 2023-10-20 (×4): qty 2

## 2023-10-20 MED ORDER — OXYCODONE HCL 5 MG PO TABS
10.0000 mg | ORAL_TABLET | Freq: Every day | ORAL | Status: DC
  Administered 2023-10-20 – 2023-10-21 (×5): 10 mg via ORAL
  Filled 2023-10-20 (×5): qty 2

## 2023-10-20 MED ORDER — CLONIDINE HCL (ANALGESIA) 100 MCG/ML EP SOLN
EPIDURAL | Status: DC | PRN
  Administered 2023-10-20: 100 ug

## 2023-10-20 SURGICAL SUPPLY — 46 items
ARTISURF 11M PLY L 6-9CD KNEE (Knees) IMPLANT
BAG COUNTER SPONGE SURGICOUNT (BAG) IMPLANT
BAG ZIPLOCK 12X15 (MISCELLANEOUS) ×1 IMPLANT
BLADE SAGITTAL 13X1.27X60 (BLADE) ×1 IMPLANT
BLADE SAW SGTL 18X1.27X75 (BLADE) ×1 IMPLANT
BLADE SURG 15 STRL LF DISP TIS (BLADE) ×1 IMPLANT
BNDG ELASTIC 6INX 5YD STR LF (GAUZE/BANDAGES/DRESSINGS) ×1 IMPLANT
BOWL SMART MIX CTS (DISPOSABLE) ×1 IMPLANT
CEMENT BONE R 1X40 (Cement) ×2 IMPLANT
COVER SURGICAL LIGHT HANDLE (MISCELLANEOUS) ×1 IMPLANT
CUFF TRNQT CYL 34X4.125X (TOURNIQUET CUFF) ×1 IMPLANT
DRAPE INCISE IOBAN 66X45 STRL (DRAPES) ×2 IMPLANT
DRAPE U-SHAPE 47X51 STRL (DRAPES) ×1 IMPLANT
DRSG AQUACEL AG ADV 3.5X10 (GAUZE/BANDAGES/DRESSINGS) ×1 IMPLANT
DURAPREP 26ML APPLICATOR (WOUND CARE) ×2 IMPLANT
ELECT REM PT RETURN 15FT ADLT (MISCELLANEOUS) ×1 IMPLANT
FEMUR CMT CCR STD SZ7 L KNEE (Knees) ×1 IMPLANT
FEMUR CMTD CCR STD SZ7 L KNEE (Knees) IMPLANT
GLOVE BIOGEL M 7.0 STRL (GLOVE) IMPLANT
GLOVE BIOGEL PI IND STRL 7.5 (GLOVE) IMPLANT
GLOVE BIOGEL PI IND STRL 8.5 (GLOVE) ×1 IMPLANT
GLOVE SURG ORTHO 8.0 STRL STRW (GLOVE) ×2 IMPLANT
GOWN STRL REUS W/ TWL XL LVL3 (GOWN DISPOSABLE) ×2 IMPLANT
HOLDER FOLEY CATH W/STRAP (MISCELLANEOUS) ×1 IMPLANT
HOOD PEEL AWAY T7 (MISCELLANEOUS) ×3 IMPLANT
KIT TURNOVER KIT A (KITS) IMPLANT
MANIFOLD NEPTUNE II (INSTRUMENTS) ×1 IMPLANT
NS IRRIG 1000ML POUR BTL (IV SOLUTION) ×1 IMPLANT
PACK TOTAL KNEE CUSTOM (KITS) ×1 IMPLANT
PROTECTOR NERVE ULNAR (MISCELLANEOUS) ×1 IMPLANT
SET HNDPC FAN SPRY TIP SCT (DISPOSABLE) ×1 IMPLANT
SPIKE FLUID TRANSFER (MISCELLANEOUS) ×2 IMPLANT
STEM POLY PAT PLY 32M KNEE (Knees) IMPLANT
STEM TIBIA 5 DEG SZ D L KNEE (Knees) IMPLANT
STRIP CLOSURE SKIN 1/2X4 (GAUZE/BANDAGES/DRESSINGS) ×1 IMPLANT
SUT BONE WAX W31G (SUTURE) ×1 IMPLANT
SUT MNCRL AB 3-0 PS2 18 (SUTURE) ×1 IMPLANT
SUT STRATAFIX 0 PDS 27 VIOLET (SUTURE) ×1 IMPLANT
SUT STRATAFIX 1PDS 45CM VIOLET (SUTURE) ×1 IMPLANT
SUT VIC AB 1 CT1 36 (SUTURE) ×1 IMPLANT
SUTURE STRATFX 0 PDS 27 VIOLET (SUTURE) ×1 IMPLANT
TIBIA STEM 5 DEG SZ D L KNEE (Knees) ×1 IMPLANT
TRAY FOLEY MTR SLVR 16FR STAT (SET/KITS/TRAYS/PACK) ×1 IMPLANT
TUBE SUCTION HIGH CAP CLEAR NV (SUCTIONS) ×1 IMPLANT
WATER STERILE IRR 1000ML POUR (IV SOLUTION) ×2 IMPLANT
WRAP KNEE MAXI GEL POST OP (GAUZE/BANDAGES/DRESSINGS) ×1 IMPLANT

## 2023-10-20 NOTE — H&P (Signed)
Audrey Cannon MRN:  [id number] DOB/SEX:  [birth year]/female  CHIEF COMPLAINT:  Painful left Knee  HISTORY: Patient is a 57 y.o. female presented with a history of pain in the left knee. Onset of symptoms was gradual starting a few years ago with gradually worsening course since that time. Patient has been treated conservatively with over-the-counter NSAIDs and activity modification. Patient currently rates pain in the knee at 10 out of 10 with activity. There is pain at night.  PAST MEDICAL HISTORY: Patient Active Problem List   Diagnosis Date Noted   Epigastric abdominal pain 08/28/2023   Mild aortic regurgitation 07/16/2023   Complication of anesthesia    Hypertension    PONV (postoperative nausea and vomiting)    Preoperative clearance 06/19/2023   Gastroesophageal reflux disease 05/19/2023   Dysphagia 05/19/2023   Bacteremia 04/19/2022   Morbid obesity (HCC) 04/19/2022   Constipation due to opioid therapy 04/18/2022   High risk medication use 04/18/2022   Opioid-induced hyperalgesia 04/18/2022   Pyelonephritis 04/18/2022   Rheumatoid arthritis (HCC) 04/18/2022   H/O aortic aneurysm repair 03/17/2020   Mixed dyslipidemia 03/17/2020   BMI 40.0-44.9, adult (HCC) 10/13/2019   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) 03/16/2019   MDD (major depressive disorder), recurrent episode, severe (HCC) 03/16/2019   Post concussion syndrome 03/16/2019   Post traumatic stress disorder (PTSD) 03/16/2019   Chronic low back pain 01/16/2019   Hyponatremia 01/15/2019   ANA positive 12/23/2018   Bilateral dry eyes 12/23/2018   Chronic nausea 12/23/2018   Polyarthralgia 12/23/2018   Bicuspid aortic valve 10/02/2017   Recurrent major depressive disorder, in partial remission (HCC) 09/01/2017   Thoracic aortic aneurysm without rupture (HCC) 09/01/2017   Acute hemorrhagic colitis 07/16/2016   Elevated white blood cell count, unspecified 07/16/2016   TBI (traumatic brain injury) (HCC) 11/01/2015    Cervical dystonia 02/03/2013   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   Acne 10/14/2012   Hypothyroidism 09/10/2011   Oral aphthae 08/15/2011   High risk medications (not anticoagulants) long-term use 05/08/2011   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   Healthcare maintenance 09/18/2009   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 DAVID DOMINGUEZ, NP ([phone]) [place] Formatting of this note might be different from the original. 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 C5-6 fusion 10/2010 [facility];(hx [place] [facility] BOTOX INJECTIONS, CHIROPRACTER, MASSAGE [place]) Formatting of this note might be different from the original. C5-6 fusion 10/2010 [facility];(hx [place] [facility] BOTOX INJECTIONS, CHIROPRACTER, MASSAGE [place])   Dysphagia 05/19/2023   Elevated white blood  cell count, unspecified 07/16/2016   Fibromyalgia 06/26/2009   Formatting of this note might be different  from the original. 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 colonoscopy 2007 negative, repeat Nov 2011 negative (fam hx, repeat 2016)   High risk medication use 04/18/2022   Hypertension    Hyponatremia 01/15/2019   Hypothyroidism 09/10/2011   MDD (major depressive disorder), recurrent episode, severe (HCC) 03/16/2019   Migraine 06/26/2009   Mixed dyslipidemia 03/17/2020   Morbid obesity (HCC) 04/19/2022   Opioid-induced hyperalgesia 04/18/2022   Oral aphthae 08/15/2011   Polyarthralgia 12/23/2018   PONV (postoperative nausea and vomiting)    Post concussion syndrome 03/16/2019   Post traumatic stress disorder (PTSD) 03/16/2019   Pyelonephritis 04/18/2022   Recurrent major depressive disorder, in partial remission (HCC) 09/01/2017   Rheumatoid arthritis (HCC) 04/18/2022   States associated with artificial menopause 06/26/2009   TBI (traumatic brain injury) (HCC) 11/01/2015   Thoracic aortic aneurysm without rupture (HCC) 09/01/2017   Formatting of this note might be different from the original. H/o repair 2/15 outside records indicate moderate aortic regurgitation and aneurysm measuring 4.6 cm by CT.  Patient had significant dyspnea on exertion and had replacement of the ascending aorta with 26 Hemashield graft.  The aortic valve was "healthy "with minor calcification small median raphae with slight right-left cusp fusion but    Unspecified osteoarthritis, unspecified site 06/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 R knee microfracture surgery/lateral patellar release 01/2013 [facility]   Upper GI bleeding 06/26/2009   Past Surgical  History:  Procedure Laterality Date   APPENDECTOMY  1993   WITH HYSTERECTOMY   BIOPSY  08/28/2023   Procedure: BIOPSY;  Surgeon: Lynann Bologna, MD;  Location: [facility] END[facility]COPY;  Service: Gastroenterology;;   CARDIAC VALVE SURGERY  2015   Ascending aortic anuerysm   Discectomy Fusion C3/4  03/2010   ESOPHAGOGASTRODUODENOSCOPY  09/03/2016   [facility].   ESOPHAGOGASTRODUODENOSCOPY (EGD) WITH PROPOFOL N/A 08/28/2023   Procedure: ESOPHAGOGASTRODUODENOSCOPY (EGD) WITH PROPOFOL;  Surgeon: Lynann Bologna, MD;  Location: [facility] ENDOSCOPY;  Service: Gastroenterology;  Laterality: N/A;   HARDWARE REMOVAL Right 09/04/2023   Procedure: HARDWARE REMOVAL OF RIGHT KNEE, MEDIAL PROXIMAL TIBIA AND MEDIAL DISTAL TIBIA;  Surgeon: Toni Arthurs, MD;  Location: [facility];  Service: Orthopedics;  Laterality: Right;   KNEE ARTHROSCOPY Right 2004   KNEE ARTHROSCOPY Left 2014   MALONEY DILATION  08/28/2023   Procedure: MALONEY DILATION;  Surgeon: Lynann Bologna, MD;  [facility];  Service: Gastroenterology;;   SPINAL CORD STIMULATOR REMOVAL  04/2023   TIBIA IM NAIL INSERTION Right 07/19/2021   Procedure: 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      amphetamine-dextroamphetamine (ADDERALL) 30 MG tablet Take 15-30 mg by mouth 2 (two) times daily as needed (focusing). (Max  45 mg daily)      cetirizine (ZYRTEC) 10 MG tablet Take 10 mg by mouth daily.      cloNIDine (CATAPRES) 0.1 MG tablet Take 0.2 mg by mouth at bedtime.      cyclobenzaprine (FLEXERIL) 10 MG tablet Take 10 mg by mouth 3 (three) times daily as needed for muscle  spasms.      dicyclomine (BENTYL) 10 MG capsule Take 1 capsule (10 mg total) by mouth 4 (four) times daily -  before meals and at bedtime. (Patient taking differently: Take 10 mg by mouth as needed for spasms.) 120 capsule 4    Doxepin HCl 6 MG TABS Take 12 mg by mouth at bedtime. (Patient not taking: Reported on 10/15/2023)      esomeprazole (NEXIUM) 20 MG capsule Take 1 capsule (20 mg total) by mouth 2 (two) times daily. 180 capsule 4    gabapentin (NEURONTIN) 300 MG capsule Take 900 mg by mouth at bedtime.      hydrOXYzine (VISTARIL) 50 MG capsule Take 1 capsule by mouth 4 (four) times daily.      Ketorolac Tromethamine (SPRIX) 15.75 MG/SPRAY SOLN Place 1 spray into the nose as needed (pain).      lamoTRIgine (LAMICTAL) 200 MG tablet Take 200 mg by mouth 2 (two) times daily.      lidocaine (LIDODERM) 5 % Place 1 patch onto the skin daily as needed (pain).      naloxone (NARCAN) nasal spray 4 mg/0.1 mL Place 0.4 mg into the nose as needed (opioid overdose).      ondansetron (ZOFRAN) 8 MG tablet Take 8 mg by mouth every 8 (eight) hours as needed for nausea or vomiting.      Oxcarbazepine (TRILEPTAL) 300 MG tablet Take 300 mg by mouth 2 (two) times daily.      Oxycodone HCl 10 MG TABS Take 10 mg by mouth 5 (five) times daily.      pantoprazole (PROTONIX) 40 MG tablet Take 1 tablet (40 mg total) by mouth 2 (two) times daily. 180 tablet 4    promethazine (PHENERGAN) 25 MG tablet Take 1 tablet (25 mg total) by mouth every 6 (six) hours as needed for nausea or vomiting. 25 tablet 2    traZODone (DESYREL) 150 MG tablet Take 150 mg by mouth at bedtime.      Vitamin D, Ergocalciferol, (DRISDOL) 1.25 MG (50000 UNIT) CAPS capsule Take 50,000 Units by mouth every Sunday.      XTAMPZA ER 36 MG C12A Take 36 mg by mouth in the morning and at bedtime.      zolpidem (AMBIEN) 10 MG tablet Take 10 mg by mouth at bedtime.      HUMIRA PEN 40 MG/0.4ML PNKT Inject 40 mg into the skin every 14 (fourteen) days.       phentermine (ADIPEX-P) 37.5 MG tablet Take 37.5 mg by mouth daily.       ALLERGIES:   Allergies  Allergen Reactions   Hydrochlorothiazide Other (See Comments)    Extremely severe headache    Cefpodoxime Other (See Comments)    Trouble breathing, fast/pounding heartbeat, nausea, blurry vision, slowed speech, stomach cramps, confusion, inability to lift head off pillow   Cefuroxime Other (See Comments)    Tachycardia, dark urine, nausea, severe diarrhea, stomach pain and cramping, severe listlessness, weakness, slurred speech, confusion, slept 14 hours, unable to walk alone   Cymbalta [Duloxetine Hcl] Other (See Comments)    No appetite, inability to stay awake, nausea, severe itching, dark  urine, extremelty weak, shaky, severe headache   Serotonin Reuptake Inhibitors (Ssris) Other (See Comments)    Nausea, dizziness, severe fatigue, anxiety, lethargy, HA, severe agitation, excessive sleeping    REVIEW OF SYSTEMS:  A comprehensive review of systems was negative except for: Musculoskeletal: positive for arthralgias and bone pain   FAMILY HISTORY:   Family History  Problem Relation Age of Onset   Hypertension Mother    Stroke Mother    Colon cancer Mother    Cancer Father    Arthritis Father    Seizures Sister    Esophageal cancer Neg Hx    Liver disease Neg Hx    Colon polyps Neg Hx    Rectal cancer Neg Hx    Stomach cancer Neg Hx     SOCIAL HISTORY:   Social History   Tobacco Use   Smoking status: Never   Smokeless tobacco: Never  Substance Use Topics   Alcohol use: Never     EXAMINATION:  Vital signs in last 24 hours:    General appearance: alert, cooperative, and no distress Extremities: extremities normal, atraumatic, no cyanosis or edema  Musculoskeletal:  ROM 0-120, Ligaments intact,  Imaging Review Plain radiographs demonstrate severe degenerative joint disease of the left knee. The overall alignment is neutral. The bone quality appears to be good for age  and reported activity level.  Assessment/Plan: Primary osteoarthritis, left knee   The patient history, physical examination and imaging studies are consistent with advanced degenerative joint disease of the left knee. The patient has failed conservative treatment.  The clearance notes were reviewed.  After discussion with the patient it was felt that Total Knee Replacement was indicated. The procedure,  risks, and benefits of total knee arthroplasty were presented and reviewed. The risks including but not limited to aseptic loosening, infection, blood clots, vascular injury, stiffness, patella tracking problems complications among others were discussed. The patient acknowledged the explanation, agreed to proceed with the plan.  Preoperative templating of the joint replacement has been completed, documented, and submitted to the Operating Room personnel in order to optimize intra-operative equipment management.    Patient's anticipated LOS is less than 2 midnights, meeting these requirements: - Younger than 19 - Lives within 1 hour of care - Has a competent adult at home to recover with post-op recover - NO history of  - Chronic pain requiring opiods  - Diabetes  - Coronary Artery Disease  - Heart failure  - Heart attack  - Stroke  - DVT/VTE  - Cardiac arrhythmia  - Respiratory Failure/COPD  - Renal failure  - Anemia  - Advanced Liver disease     Guy Sandifer 10/20/2023, 7:05 AM

## 2023-10-20 NOTE — Anesthesia Procedure Notes (Signed)
Anesthesia Regional Block: Adductor canal block   Pre-Anesthetic Checklist: , timeout performed,  Correct Patient, Correct Site, Correct Laterality,  Correct Procedure, Correct Position, site marked,  Risks and benefits discussed,  Surgical consent,  Pre-op evaluation,  At surgeon's request and post-op pain management  Laterality: Left  Prep: Maximum Sterile Barrier Precautions used, chloraprep       Needles:  Injection technique: Single-shot  Needle Type: Echogenic Needle      Needle Gauge: 20     Additional Needles:   Procedures:,,,, ultrasound used (permanent image in chart),,    Narrative:  Start time: 10/20/2023 9:00 AM End time: 10/20/2023 9:05 AM Injection made incrementally with aspirations every 5 mL.  Performed by: Personally  Anesthesiologist: Mariann Barter, MD

## 2023-10-20 NOTE — Anesthesia Postprocedure Evaluation (Signed)
Anesthesia Post Note  Patient: Audrey Cannon  Procedure(s) Performed: LEFT TOTAL KNEE ARTHROPLASTY (Left: Knee)     Patient location during evaluation: PACU Anesthesia Type: Spinal Level of consciousness: oriented and awake and alert Pain management: pain level controlled Vital Signs Assessment: post-procedure vital signs reviewed and stable Respiratory status: spontaneous breathing, respiratory function stable and patient connected to nasal cannula oxygen Cardiovascular status: blood pressure returned to baseline and stable Postop Assessment: no headache, no backache and no apparent nausea or vomiting Anesthetic complications: no   No notable events documented.  Last Vitals:  Vitals:   10/20/23 1230 10/20/23 1312  BP: 113/60 139/73  Pulse: 74 81  Resp: 12 18  Temp: 36.7 C 36.4 C  SpO2: 96% 97%    Last Pain:  Vitals:   10/20/23 1419  TempSrc:   PainSc: 8                  Mariann Barter

## 2023-10-20 NOTE — Transfer of Care (Signed)
Immediate Anesthesia Transfer of Care Note  Patient: Audrey Cannon  Procedure(s) Performed: LEFT TOTAL KNEE ARTHROPLASTY (Left: Knee)  Patient Location: PACU  Anesthesia Type:Spinal  Level of Consciousness: awake, alert , and oriented  Airway & Oxygen Therapy: Patient Spontanous Breathing and Patient connected to face mask oxygen  Post-op Assessment: Report given to RN and Post -op Vital signs reviewed and stable  Post vital signs: Reviewed and stable  Last Vitals:  Vitals Value Taken Time  BP 109/72 10/20/23 1135  Temp    Pulse 84 10/20/23 1141  Resp 16 10/20/23 1141  SpO2 100 % 10/20/23 1141  Vitals shown include unfiled device data.  Last Pain:  Vitals:   10/20/23 0908  TempSrc:   PainSc: 0-No pain      Patients Stated Pain Goal: 4 (10/20/23 0756)  Complications: No notable events documented.

## 2023-10-20 NOTE — Progress Notes (Signed)
Orthopedic Tech Progress Note Patient Details:  Audrey Cannon 09-Aug-1966 [id number]  CPM Left Knee CPM Left Knee: On Left Knee Flexion (Degrees): 50 Left Knee Extension (Degrees): 0 Additional Comments: Unable to tolerate anything greater than 50 degrees.  Post Interventions Patient Tolerated: Fair Instructions Provided: Care of device Ortho Devices Type of Ortho Device: Bone foam zero knee Ortho Device/Splint Interventions: Ordered   Post Interventions Patient Tolerated: Fair Instructions Provided: Care of device  [place] A [name] 10/20/2023, 12:51 PM

## 2023-10-20 NOTE — Anesthesia Preprocedure Evaluation (Signed)
Anesthesia Evaluation  Patient identified by MRN, date of birth, ID band Patient awake    Reviewed: Allergy & Precautions, NPO status , Patient's Chart, lab work & pertinent test results, reviewed documented beta blocker date and time   History of Anesthesia Complications (+) PONV and history of anesthetic complications  Airway Mallampati: III  TM Distance: >3 FB Neck ROM: Limited    Dental no notable dental hx.    Pulmonary neg COPD, neg PE   breath sounds clear to auscultation       Cardiovascular hypertension, (-) angina (-) Past MI, (-) Cardiac Stents and (-) CABG + Valvular Problems/Murmurs AI  Rhythm:Regular Rate:Normal  S/p ascending ao replacement   Neuro/Psych  Headaches, neg Seizures PSYCHIATRIC DISORDERS Anxiety Depression Bipolar Disorder    Neuromuscular disease    GI/Hepatic ,GERD  ,,(+) neg Cirrhosis        Endo/Other  Hypothyroidism    Renal/GU Renal disease     Musculoskeletal  (+) Arthritis ,  Fibromyalgia -  Abdominal   Peds  Hematology   Anesthesia Other Findings   Reproductive/Obstetrics                              Anesthesia Physical Anesthesia Plan  ASA: 3  Anesthesia Plan: Spinal   Post-op Pain Management: Regional block* and Ketamine IV*   Induction: Intravenous  PONV Risk Score and Plan: 2 and Ondansetron and Propofol infusion  Airway Management Planned:   Additional Equipment:   Intra-op Plan:   Post-operative Plan:   Informed Consent: I have reviewed the patients History and Physical, chart, labs and discussed the procedure including the risks, benefits and alternatives for the proposed anesthesia with the patient or authorized representative who has indicated his/her understanding and acceptance.     Dental advisory given  Plan Discussed with:   Anesthesia Plan Comments:          Anesthesia Quick Evaluation

## 2023-10-20 NOTE — Evaluation (Signed)
Physical Therapy Evaluation Patient Details Name: Audrey Cannon MRN: [id number] DOB: 1966-01-27 Today's Date: 10/20/2023  History of Present Illness  57 yo female presents to therapy s/p L TKA on 10/20/2023 due to failure of conservative measures. Pt PMH includes but is not limited to: HTN, GERD, RA, aortic aneurysm repair, TBI, depression, bipolar, anxiety, PTSD, chronic LBP, Budd-Charari syndrome, DDD of cervical region s/p fusion, fibromyalgia, R tibial shaft fx s/p ORIF and recent hardware removal 09/04/2023.  Clinical Impression   Audrey Cannon is a 57 y.o. female POD 0 s/p L TKA. Patient reports mod I with mobility at baseline. Patient is now limited by functional impairments (see PT problem list below) and requires S for bed mobility and CGA and cues for transfers. Patient was able to ambulate 12 feet with RW and CGA level of assist. Patient instructed in exercise to facilitate ROM and circulation to manage edema.  Patient will benefit from continued skilled PT interventions to address impairments and progress towards PLOF. Acute PT will follow to progress mobility and stair training in preparation for safe discharge home with family support and OPPT services.       If plan is discharge home, recommend the following: A lot of help with walking and/or transfers;A little help with bathing/dressing/bathroom;Assistance with cooking/housework;Help with stairs or ramp for entrance;Assist for transportation   Can travel by private vehicle        Equipment Recommendations None recommended by PT  Recommendations for Other Services       Functional Status Assessment Patient has had a recent decline in their functional status and demonstrates the ability to make significant improvements in function in a reasonable and predictable amount of time.     Precautions / Restrictions Precautions Precautions: Knee;Fall Restrictions Weight Bearing Restrictions: No      Mobility  Bed  Mobility Overal bed mobility: Needs Assistance Bed Mobility: Supine to Sit     Supine to sit: Contact guard, HOB elevated, Used rails     General bed mobility comments: min cues    Transfers Overall transfer level: Needs assistance Equipment used: Rolling walker (2 wheels) Transfers: Sit to/from Stand Sit to Stand: Contact guard assist           General transfer comment: min cues    Ambulation/Gait Ambulation/Gait assistance: Contact guard assist Gait Distance (Feet): 12 Feet Assistive device: Rolling walker (2 wheels) Gait Pattern/deviations: Step-to pattern, Antalgic, Trunk flexed Gait velocity: decreased     General Gait Details: B UE support at RW to offload LLE in stance phase, cues for encouragement and step to pattern, pt reports her R knee is bad too  Careers information officer     Tilt Bed    Modified Rankin (Stroke Patients Only)       Balance Overall balance assessment: Needs assistance Sitting-balance support: Feet supported Sitting balance-Leahy Scale: Good     Standing balance support: Bilateral upper extremity supported, During functional activity, Reliant on assistive device for balance Standing balance-Leahy Scale: Poor                               Pertinent Vitals/Pain Pain Assessment Pain Assessment: 0-10 Pain Score: 9  (chronic pain at baseline) Pain Location: L knee (hx of shoulder pain) Pain Descriptors / Indicators: Aching, Constant, Discomfort, Dull, Grimacing, Operative site guarding, Crying Pain Intervention(s): Limited activity within patient's tolerance, Monitored during session,  Premedicated before session, Repositioned, Ice applied    Home Living Family/patient expects to be discharged to:: Private residence Living Arrangements: Spouse/significant other Available Help at Discharge: Family Type of Home: House Home Access: Stairs to enter;Ramped entrance Entrance Stairs-Rails:  Right;Left;Can reach both Entrance Stairs-Number of Steps: 10 small steps   Home Layout: One level Home Equipment: Agricultural consultant (2 wheels);Cane - single point;Grab bars - toilet;Shower seat;Grab bars - tub/shower;Rollator (4 wheels);Electric scooter;Wheelchair - manual;Crutches (CPM)      Prior Function Prior Level of Function : Independent/Modified Independent             Mobility Comments: rollator for household mobility and electric scooter for longer community distances, pt required A for shower transfers, mod I for dressing ADLs Comments: limited standing tolerance to 2 mins at baseline, sits in wc for ADLs and use wc for some household mobility is able to self propel with b LEs     Extremity/Trunk Assessment        Lower Extremity Assessment Lower Extremity Assessment: LLE deficits/detail LLE Deficits / Details: ankle DF 3/5 PF 4-/5 with c/o pain; SLR < 10 degree lag LLE Sensation: decreased light touch (L LE)    Cervical / Trunk Assessment Cervical / Trunk Assessment: Neck Surgery  Communication   Communication Communication: No apparent difficulties  Cognition Arousal: Alert Behavior During Therapy: WFL for tasks assessed/performed Overall Cognitive Status: Within Functional Limits for tasks assessed                                          General Comments      Exercises Total Joint Exercises Ankle Circles/Pumps: AROM, Both, 10 reps   Assessment/Plan    PT Assessment Patient needs continued PT services  PT Problem List Decreased strength;Decreased range of motion;Decreased activity tolerance;Decreased balance;Decreased mobility;Decreased coordination;Pain       PT Treatment Interventions DME instruction;Gait training;Functional mobility training;Therapeutic activities;Therapeutic exercise;Neuromuscular re-education;Balance training;Patient/family education;Modalities    PT Goals (Current goals can be found in the Care Plan section)   Acute Rehab PT Goals Patient Stated Goal: to be able to walk PT Goal Formulation: With patient Time For Goal Achievement: 11/03/23 Potential to Achieve Goals: Fair    Frequency 7X/week     Co-evaluation               AM-PAC PT "6 Clicks" Mobility  Outcome Measure Help needed turning from your back to your side while in a flat bed without using bedrails?: A Little Help needed moving from lying on your back to sitting on the side of a flat bed without using bedrails?: A Little Help needed moving to and from a bed to a chair (including a wheelchair)?: A Little Help needed standing up from a chair using your arms (e.g., wheelchair or bedside chair)?: A Little Help needed to walk in hospital room?: A Little Help needed climbing 3-5 steps with a railing? : Total 6 Click Score: 16    End of Session Equipment Utilized During Treatment: Gait belt       PT Visit Diagnosis: Unsteadiness on feet (R26.81);Other abnormalities of gait and mobility (R26.89);Muscle weakness (generalized) (M62.81);Difficulty in walking, not elsewhere classified (R26.2);Pain Pain - Right/Left: Left Pain - part of body: Knee;Leg    Time: 1324-4010 PT Time Calculation (min) (ACUTE ONLY): 31 min   Charges:   PT Evaluation $PT Eval Low Complexity: 1 Low PT Treatments $  Gait Training: 8-22 mins PT General Charges $$ ACUTE PT VISIT: 1 Visit         Johnny Bridge, PT Acute Rehab   Jacqualyn Posey 10/20/2023, 3:51 PM

## 2023-10-20 NOTE — Anesthesia Procedure Notes (Signed)
Spinal  Patient location during procedure: OR Start time: 10/20/2023 10:05 AM End time: 10/20/2023 10:10 AM Reason for block: surgical anesthesia Staffing Performed: anesthesiologist  Anesthesiologist: Mariann Barter, MD Performed by: Mariann Barter, MD Authorized by: Mariann Barter, MD   Preanesthetic Checklist Completed: patient identified, IV checked, site marked, risks and benefits discussed, surgical consent, monitors and equipment checked, pre-op evaluation and timeout performed Spinal Block Patient position: sitting Prep: Betadine Patient monitoring: heart rate, continuous pulse ox and blood pressure Location: L3-4 Injection technique: single-shot Needle Needle type: Sprotte  Needle gauge: 24 G Needle length: 9 cm Assessment Events: CSF return Additional Notes

## 2023-10-20 NOTE — Op Note (Signed)
TOTAL KNEE REPLACEMENT OPERATIVE NOTE:  10/20/2023  11:47 AM  PATIENT:  Audrey Cannon  57 y.o. female  PRE-OPERATIVE DIAGNOSIS:  Osteoarthritis left knee  POST-OPERATIVE DIAGNOSIS:  Osteoarthritis left knee  PROCEDURE:  Procedure(s): LEFT TOTAL KNEE ARTHROPLASTY  SURGEON:  Surgeon(s): Dannielle Huh, MD  PHYSICIAN ASSISTANT: Laurier Nancy, PA-C   ANESTHESIA:   spinal  SPECIMEN: None  COUNTS:  Correct  TOURNIQUET:   Total Tourniquet Time Documented: Thigh (Left) - 35 minutes Total: Thigh (Left) - 35 minutes   DICTATION:  Indication for procedure:    The patient is a 57 y.o. female who has failed conservative treatment for Osteoarthritis left knee.  Informed consent was obtained prior to anesthesia. The risks versus benefits of the operation were explain and in a way the patient can, and did, understand.     Description of procedure:     The patient was taken to the operating room and placed under anesthesia.  The patient was positioned in the usual fashion taking care that all body parts were adequately padded and/or protected.  A tourniquet was applied and the leg prepped and draped in the usual sterile fashion.  The extremity was exsanguinated with the esmarch and tourniquet inflated to 300 mmHg.  Pre-operative range of motion was normal.    A midline incision approximately 6-7 inches long was made with a #10 blade.  A new blade was used to make a parapatellar arthrotomy going 2-3 cm into the quadriceps tendon, over the patella, and alongside the medial aspect of the patellar tendon.  A synovectomy was then performed with the #10 blade and forceps. I then elevated the deep MCL off the medial tibial metaphysis subperiosteally around to the semimembranosus attachment.    I everted the patella and used calipers to measure patellar thickness.  I used the reamer to ream down to appropriate thickness to recreate the native thickness.  I then removed excess bone with the rongeur  and sagittal saw.  I used the appropriately sized template and drilled the three lug holes.  I then put the trial in place and measured the thickness with the calipers to ensure recreation of the native thickness.  The trial was then removed and the patella subluxed and the knee brought into flexion.  A homan retractor was place to retract and protect the patella and lateral structures.  A Z-retractor was place medially to protect the medial structures.  The extra-medullary alignment system was used to make cut the tibial articular surface perpendicular to the anamotic axis of the tibia and in 3 degrees of posterior slope.  The cut surface and alignment jig was removed.  I then used the intramedullary alignment guide to make a 5 valgus cut on the distal femur.  I then marked out the epicondylar axis on the distal femur.   I then used the anterior referencing sizer and measured the femur to be a size 7.  The 4-In-1 cutting block was screwed into place in external rotation matching the posterior condylar angle, making our cuts perpendicular to the epicondylar axis.  Anterior, posterior and chamfer cuts were made with the sagittal saw.  The cutting block and cut pieces were removed.  A lamina spreader was placed in 90 degrees of flexion.  The ACL, PCL, menisci, and posterior condylar osteophytes were removed.  A 11 mm spacer blocked was found to offer good flexion and extension gap balance after minimal in degree releasing.   The scoop retractor was then placed and the femoral  finishing block was pinned in place.  The small sagittal saw was used as well as the lug drill to finish the femur.  The block and cut surfaces were removed and the medullary canal hole filled with autograft bone from the cut pieces.  The tibia was delivered forward in deep flexion and external rotation.  A size D tray was selected and pinned into place centered on the medial 1/3 of the tibial tubercle.  The reamer and keel was used to  prepare the tibia through the tray.    I then trialed with the size 7 femur, size D tibia, a 11 mm insert and the 32 patella.  I had excellent flexion/extension gap balance, excellent patella tracking.  Flexion was full and beyond 120 degrees; extension was zero.  These components were chosen and the staff opened them to me on the back table while the knee was lavaged copiously and the cement mixed.  The soft tissue was infiltrated with 60cc of exparel 1.3% through a 21 gauge needle.  I cemented in the components and removed all excess cement.  The polyethylene tibial component was snapped into place and the knee placed in extension while cement was hardening.  The capsule was infilltrated with a 60cc exparel/marcaine/saline mixture.   Once the cement was hard, the tourniquet was let down.  Hemostasis was obtained.  The arthrotomy was closed using a #1 stratofix running suture.  The deep soft tissues were closed with #0 vicryls and the subcuticular layer closed with #2-0 vicryl.  The skin was reapproximated and closed with 3.0 Monocryl.  The wound was covered with steristrips, aquacel dressing, and a TED stocking.   The patient was then awakened, extubated, and taken to the recovery room in stable condition.  BLOOD LOSS:  300cc COMPLICATIONS:  None.  PLAN OF CARE: Admit for overnight observation  PATIENT DISPOSITION:  PACU - hemodynamically stable.   Delay start of Pharmacological VTE agent (>24hrs) due to surgical blood loss or risk of bleeding:  yes  Please fax a copy of this op note to my office at [phone] (please only include page 1 and 2 of the Case Information op note)

## 2023-10-20 NOTE — Progress Notes (Signed)
Orthopedic Tech Progress Note Patient Details:  Audrey Cannon 1965-11-30 [id number]  Patient ID: Audrey Cannon, female   DOB: May 19, 1966, 57 y.o.   MRN: [id number] CPM removed by PT. Darleen Crocker 10/20/2023, 4:03 PM

## 2023-10-21 ENCOUNTER — Encounter (HOSPITAL_COMMUNITY): Payer: Self-pay | Admitting: Orthopedic Surgery

## 2023-10-21 ENCOUNTER — Other Ambulatory Visit (HOSPITAL_COMMUNITY): Payer: Self-pay

## 2023-10-21 DIAGNOSIS — M1712 Unilateral primary osteoarthritis, left knee: Secondary | ICD-10-CM | POA: Diagnosis not present

## 2023-10-21 MED ORDER — CELECOXIB 200 MG PO CAPS
200.0000 mg | ORAL_CAPSULE | Freq: Two times a day (BID) | ORAL | 0 refills | Status: DC
  Filled 2023-10-21: qty 60, 30d supply, fill #0

## 2023-10-21 MED ORDER — METHOCARBAMOL 500 MG PO TABS
500.0000 mg | ORAL_TABLET | Freq: Four times a day (QID) | ORAL | 0 refills | Status: DC | PRN
  Filled 2023-10-21: qty 60, 8d supply, fill #0

## 2023-10-21 MED ORDER — HYDROMORPHONE HCL 2 MG PO TABS
2.0000 mg | ORAL_TABLET | Freq: Four times a day (QID) | ORAL | 0 refills | Status: DC | PRN
  Filled 2023-10-21: qty 40, 5d supply, fill #0

## 2023-10-21 NOTE — Plan of Care (Signed)
Discharged to home with husband.  Teaching done.  Meds delivered from pharmacy and given to patient. All questions answered.

## 2023-10-21 NOTE — Plan of Care (Signed)
  Problem: Education: Goal: Knowledge of General Education information will improve Description: Including pain rating scale, medication(s)/side effects and non-pharmacologic comfort measures Outcome: Progressing   Problem: Elimination: Goal: Will not experience complications related to bowel motility Outcome: Progressing   

## 2023-10-21 NOTE — Progress Notes (Signed)
Physical Therapy Treatment Patient Details Name: Audrey Cannon MRN: [id number] DOB: [birth year] Today's Date: 10/21/2023   History of Present Illness 57 yo female presents to therapy s/p L TKA on 10/20/2023 due to failure of conservative measures. Pt PMH includes but is not limited to: HTN, GERD, RA, aortic aneurysm repair, TBI, depression, bipolar, anxiety, PTSD, chronic LBP, Budd-Charari syndrome, DDD of cervical region s/p fusion, fibromyalgia, R tibial shaft fx s/p ORIF and recent hardware removal 09/04/2023.    PT Comments  Pt POD1, present supine in bed, needing increased time to self assist LLE to EOB, no physical assist from therapist. Pt performs STS transfers from EOB and recliner, maintains LLE slightly extended for comfort, BUE assisting to power up, good steadiness. Pt initially amb 15 ft then requests to stop due to pain and limited ambulator at baseline. Provided pt with written/illustrated HEP and pt able to perform 3 reps of each exercise, good muscle activation, verbal cues to avoid breath holding. After exercises, pt agreeable to amb ~40 ft, but with conversation and spouse encouragement pt amb 50 ft, step to gait pattern, no LOB, spouse providing chair follow for seated rest break if needed. Pt reports chronic pain at baseline, 8/10 at rest and 9/10 with ambulation. All questions answered and education complete, pt reports ready to d/c home with spouse support, HEP and OPPT scheduled.   If plan is discharge home, recommend the following: A lot of help with walking and/or transfers;A little help with bathing/dressing/bathroom;Assistance with cooking/housework;Help with stairs or ramp for entrance;Assist for transportation   Can travel by private vehicle        Equipment Recommendations  None recommended by PT    Recommendations for Other Services       Precautions / Restrictions Precautions Precautions: Knee;Fall Restrictions Weight Bearing Restrictions: No      Mobility  Bed Mobility Overal bed mobility: Needs Assistance Bed Mobility: Supine to Sit     Supine to sit: Supervision     General bed mobility comments: pt self assisting LLE to EOB, increased time, no cues or assist    Transfers Overall transfer level: Needs assistance Equipment used: Rolling walker (2 wheels) Transfers: Sit to/from Stand Sit to Stand: Supervision           General transfer comment: LLE slightly extended to power up and for controlled lowering, slow to rise up, BUE assisting    Ambulation/Gait Ambulation/Gait assistance: Supervision Gait Distance (Feet): 50 Feet (+15 additional) Assistive device: Rolling walker (2 wheels) Gait Pattern/deviations: Step-to pattern, Antalgic, Trunk flexed, Decreased stance time - left, Decreased weight shift to left Gait velocity: decreased     General Gait Details: step-to gait pattern with decreased LLE stance time and weight bearing, trunk slightly flexed forward with BUE assist on RW to decrease weight in LLE, no overt LOB or knee buckling, spouse providing chair follow per pt request and pt needing seated rest break after 50 ft   Stairs             Wheelchair Mobility     Tilt Bed    Modified Rankin (Stroke Patients Only)       Balance Overall balance assessment: Needs assistance Sitting-balance support: Feet supported Sitting balance-Leahy Scale: Good     Standing balance support: Bilateral upper extremity supported, During functional activity, Reliant on assistive device for balance Standing balance-Leahy Scale: Poor  Cognition Arousal: Alert Behavior During Therapy: WFL for tasks assessed/performed Overall Cognitive Status: Within Functional Limits for tasks assessed                                          Exercises Total Joint Exercises Ankle Circles/Pumps: AROM, Both, 10 reps, Seated Quad Sets: AROM, Strengthening, Both,  Seated Short Arc Quad: AROM, Strengthening, Left, Seated Hip ABduction/ADduction: AROM, Strengthening, Left, Seated Straight Leg Raises: AROM, Strengthening, Left, Seated Long Arc Quad: AROM, Strengthening, Left, Seated Knee Flexion: AROM, Strengthening, Left, Seated    General Comments        Pertinent Vitals/Pain Pain Assessment Pain Assessment: 0-10 Pain Score: 8  (8/10 at rest, 9/10 with ambulation) Pain Location: both knees, pt reports fibromyalgia and RA related chronic pain Pain Descriptors / Indicators: Aching, Constant, Discomfort, Grimacing, Sore Pain Intervention(s): Limited activity within patient's tolerance, Monitored during session, Premedicated before session, Repositioned, Ice applied    Home Living                          Prior Function            PT Goals (current goals can now be found in the care plan section) Acute Rehab PT Goals Patient Stated Goal: to be able to walk PT Goal Formulation: With patient Time For Goal Achievement: 11/03/23 Potential to Achieve Goals: Fair Progress towards PT goals: Progressing toward goals    Frequency    7X/week      PT Plan      Co-evaluation              AM-PAC PT "6 Clicks" Mobility   Outcome Measure  Help needed turning from your back to your side while in a flat bed without using bedrails?: A Little Help needed moving from lying on your back to sitting on the side of a flat bed without using bedrails?: A Little Help needed moving to and from a bed to a chair (including a wheelchair)?: A Little Help needed standing up from a chair using your arms (e.g., wheelchair or bedside chair)?: A Little Help needed to walk in hospital room?: A Little Help needed climbing 3-5 steps with a railing? : Total 6 Click Score: 16    End of Session Equipment Utilized During Treatment: Gait belt Activity Tolerance: Patient tolerated treatment well;Patient limited by pain Patient left: in chair;with call  bell/phone within reach;with family/visitor present Nurse Communication: Mobility status PT Visit Diagnosis: Unsteadiness on feet (R26.81);Other abnormalities of gait and mobility (R26.89);Muscle weakness (generalized) (M62.81);Difficulty in walking, not elsewhere classified (R26.2);Pain Pain - Right/Left: Left Pain - part of body: Knee;Leg     Time: 1202-1240 PT Time Calculation (min) (ACUTE ONLY): 38 min  Charges:    $Gait Training: 8-22 mins $Therapeutic Exercise: 8-22 mins $Therapeutic Activity: 8-22 mins PT General Charges $$ ACUTE PT VISIT: 1 Visit                     Tori Alashia Brownfield PT, DPT 10/21/23, 1:25 PM

## 2023-10-21 NOTE — Discharge Summary (Signed)
SPORTS MEDICINE & JOINT REPLACEMENT   Georgena Spurling, MD   Laurier Nancy, PA-C [address] [place], [place], [place]  [address]                             [phone]  PATIENT ID: Audrey Cannon        MRN:  [id number]          DOB/AGE: 56-Nov-1967 / 56 y.o.    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 DAVID DOMINGUEZ, NP ([phone]) [place] Formatting of this note might be different from the original. 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 C5-6 fusion 10/2010 [facility];(hx [facility] BOTOX INJECTIONS, CHIROPRACTER, MASSAGE [place]) Formatting of this note might be different from the original. C5-6 fusion 10/2010 [facility]surgery;(hx [facility] BOTOX INJECTIONS, CHIROPRACTER, MASSAGE TX)   Dysphagia 05/19/2023   Elevated white blood cell count, unspecified 07/16/2016   Fibromyalgia 06/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 colonoscopy 2007 negative, repeat Nov 2011 negative (fam hx, repeat 2016)   High risk medication use 04/18/2022   Hypertension    Hyponatremia 01/15/2019   Hypothyroidism 09/10/2011   MDD (major depressive disorder), recurrent episode, severe (HCC) 03/16/2019   Migraine 06/26/2009   Mixed dyslipidemia 03/17/2020   Morbid obesity (HCC) 04/19/2022   Opioid-induced hyperalgesia 04/18/2022   Oral aphthae 08/15/2011   Polyarthralgia 12/23/2018   PONV (postoperative nausea and vomiting)    Post concussion syndrome 03/16/2019   Post traumatic stress disorder (PTSD) 03/16/2019   Pyelonephritis 04/18/2022   Recurrent major depressive disorder, in partial remission (HCC) 09/01/2017   Rheumatoid arthritis (HCC) 04/18/2022   States associated with artificial menopause 06/26/2009   TBI (traumatic brain injury) (HCC) 11/01/2015   Thoracic aortic aneurysm without rupture (HCC) 09/01/2017   Formatting of this note might be different from the original. H/o repair 2/15 outside records indicate moderate aortic regurgitation and aneurysm measuring 4.6 cm by CT.  Patient had significant dyspnea on exertion and had replacement of the ascending aorta with 26 Hemashield graft.  The aortic valve was "healthy "with minor calcification small median raphae with slight right-left cusp  fusion but    Unspecified osteoarthritis, unspecified site 06/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 R knee microfracture surgery/lateral patellar release 01/2013 [facility]   Upper GI bleeding 06/26/2009    PROCEDURE: Procedure(s): LEFT TOTAL KNEE ARTHROPLASTY on 10/20/2023  CONSULTS:    HISTORY:  See  H&P in chart  HOSPITAL COURSE:  Audrey Cannon is a 57 y.o. admitted on 10/20/2023 and found to have a diagnosis of Osteoarthritis left knee.  After appropriate laboratory studies were obtained  they were taken to the operating room on 10/20/2023 and underwent Procedure(s): LEFT TOTAL KNEE ARTHROPLASTY.   They were given perioperative antibiotics:  Anti-infectives (From admission, onward)    Start     Dose/Rate Route Frequency Ordered Stop   10/20/23 2200  vancomycin (VANCOCIN) IVPB 1000 mg/200 mL premix        1,000 mg 200 mL/hr over 60 Minutes Intravenous Every 12 hours 10/20/23 1258 10/20/23 2250   10/20/23 1400  acyclovir (ZOVIRAX) tablet 400 mg        400 mg Oral 2 times daily 10/20/23 1258     10/20/23 0715  vancomycin (VANCOCIN) IVPB 1000 mg/200 mL premix        1,000 mg 200 mL/hr over 60 Minutes Intravenous On call to O.R. 10/20/23 1610 10/20/23 1016     .  Patient given tranexamic acid IV or topical and exparel intra-operatively.  Tolerated the procedure well.    POD# 1: Vital signs were stable.  Patient denied Chest pain, shortness of breath, or calf pain.  Patient was started on Aspirin twice daily at 8am.  Consults to PT, OT, and care management were made.  The patient was weight bearing as tolerated.  CPM was placed on the operative leg 0-90 degrees for 6-8 hours a day. When out of the CPM, patient was placed in the foam block to achieve full extension. Incentive spirometry was taught.  Dressing was changed.       POD #2, Continued  PT for ambulation and exercise program.  IV saline locked.  O2 discontinued.    The remainder of the hospital course was dedicated to ambulation and strengthening.   The patient was discharged on 1 Day Post-Op in  Good condition.  Blood products given:none  DIAGNOSTIC STUDIES: Recent vital signs: Patient Vitals for the past 24 hrs:  BP Temp Temp src Pulse Resp SpO2  10/21/23 0541 136/72 98 F (36.7 C) Oral 67 18 100 %  10/21/23 0219 (!)  143/67 98 F (36.7 C) Oral 71 17 98 %  10/20/23 2235 123/66 98 F (36.7 C) Oral 73 18 96 %  10/20/23 2126 137/78 -- -- -- -- --  10/20/23 1837 137/78 98.5 F (36.9 C) -- 77 18 94 %  10/20/23 1630 124/67 98.3 F (36.8 C) -- 85 18 97 %  10/20/23 1312 139/73 97.6 F (36.4 C) -- 81 18 97 %  10/20/23 1230 113/60 98.1 F (36.7 C) -- 74 12 96 %  10/20/23 1215 121/68 -- -- 79 11 97 %  10/20/23 1200 119/60 -- -- 82 14 93 %  10/20/23 1145 121/62 -- -- 84 12 100 %  10/20/23 1135 109/72 98 F (36.7 C) -- 86 19 100 %  10/20/23 1132 (!) 85/56 -- -- 87 (!) 30 100 %  10/20/23 0907 -- -- -- 81 -- 97 %  10/20/23 0906 -- -- -- 78 -- 98 %  10/20/23 0905 -- -- -- 78 -- 97 %  10/20/23 0904 -- -- -- 81 -- 98 %  10/20/23 0903 117/70 -- -- 76 -- 98 %  10/20/23 0902 -- -- -- 77 -- 98 %  10/20/23 0901 -- -- -- 76 -- 91 %       Recent laboratory studies: Recent Labs    10/15/23 0939  WBC 15.9*  HGB 15.0  HCT 43.9  PLT 358   Recent Labs    10/15/23 0939  NA 131*  K 4.1  CL 94*  CO2 28  BUN 21*  CREATININE 0.78  GLUCOSE 101*  CALCIUM 9.2   No results found for: "INR", "PROTIME"   Recent Radiographic Studies :  No results found.  DISCHARGE INSTRUCTIONS: Discharge Instructions     Call MD / Call 911   Complete by: As directed    If you experience chest pain or shortness of breath, CALL 911 and be transported to the hospital emergency room.  If you develope a fever above 101 F, pus (white drainage) or increased drainage or redness at the wound, or calf pain, call your surgeon's office.   Constipation Prevention   Complete by: As directed    Drink plenty of fluids.  Prune juice may be helpful.  You may use a stool softener, such as Colace (over the counter) 100 mg twice a day.  Use MiraLax (over the counter) for constipation as needed.   Diet - low sodium heart healthy   Complete by: As directed    Discharge instructions   Complete by: As directed    INSTRUCTIONS AFTER JOINT  REPLACEMENT   Remove items at home which could result in a fall. This includes throw rugs or furniture in walking pathways ICE to the affected joint every three hours while awake for 30 minutes at a time, for at least the first 3-5 days, and then as needed for pain and swelling.  Continue to use ice for pain and swelling. You may notice swelling that will progress down to the foot and ankle.  This is normal after surgery.  Elevate your leg when you are not up walking on it.   Continue to use the breathing machine you got in the hospital (incentive spirometer) which will help keep your temperature down.  It is common for your temperature to cycle up and down following surgery, especially at night when you are not up moving around and exerting yourself.  The breathing machine keeps your lungs expanded and your temperature down.   DIET:  As you were doing prior to hospitalization, we recommend a well-balanced diet.  DRESSING / WOUND CARE / SHOWERING  Keep the surgical dressing until follow up.  The dressing is water proof, so you can shower without any extra covering.  IF THE DRESSING FALLS OFF or the wound gets wet inside, change the dressing with sterile gauze.  Please use good hand washing techniques before changing the dressing.  Do not use any lotions or creams on the incision until instructed by your surgeon.    ACTIVITY  Increase activity slowly as tolerated, but follow the weight bearing instructions below.   No driving for 6 weeks or until further direction given by your physician.  You cannot drive while taking narcotics.  No lifting or carrying greater than 10 lbs. until further directed by your surgeon. Avoid periods of inactivity such as sitting longer than an hour when not asleep. This helps prevent blood clots.  You may return to work once you are authorized by your doctor.  WEIGHT BEARING   Weight bearing as tolerated with assist device (walker, cane, etc) as directed, use it  as long as suggested by your surgeon or therapist, typically at least 4-6 weeks.   EXERCISES  Results after joint replacement surgery are often greatly improved when you follow the exercise, range of motion and muscle strengthening exercises prescribed by your doctor. Safety measures are also important to protect the joint from further injury. Any time any of these exercises cause you to have increased pain or swelling, decrease what you are doing until you are comfortable again and then slowly increase them. If you have problems or questions, call your caregiver or physical therapist for advice.   Rehabilitation is important following a joint replacement. After just a few days of immobilization, the muscles of the leg can become weakened and shrink (atrophy).  These exercises are designed to build up the tone and strength of the thigh and leg muscles and to improve motion. Often times heat used for twenty to thirty minutes before working out will loosen up your tissues and help with improving the range of motion but do not use heat for the first two weeks following surgery (sometimes heat can increase post-operative swelling).   These exercises can be done on a training (exercise) mat, on the floor, on a table or on a bed. Use whatever works the best and is most comfortable for you.    Use music or television while you are exercising so that the exercises are a pleasant break in your day. This will make your life better with the exercises acting as a break in your routine that you can look forward to.   Perform all exercises about fifteen times, three times per day or as directed.  You should exercise both the operative leg and the other leg as well.  Exercises include:   Quad Sets - Tighten up the muscle on the front of the thigh (Quad) and hold for 5-10 seconds.   Straight Leg Raises - With your knee straight (if you were given a brace, keep it on), lift the leg to 60 degrees, hold for 3 seconds,  and slowly lower the leg.  Perform this exercise against resistance later as your leg gets stronger.  Leg Slides: Lying on your back, slowly slide your foot toward your buttocks, bending your knee up off the floor (only go as far as is comfortable). Then slowly slide your foot back down until your leg is flat on the floor again.  Angel Wings: Lying on your back spread your legs to the side as far apart as you can without causing discomfort.  Hamstring Strength:  Lying on your back, push your heel against the floor with your leg straight by tightening up the muscles of your buttocks.  Repeat, but this time bend your knee to a comfortable angle, and push your heel against the floor.  You may put a pillow under the heel to make it more comfortable if necessary.   A rehabilitation program following joint replacement surgery can speed recovery and prevent re-injury in the future due to weakened muscles. Contact your doctor or a physical therapist for more information on knee rehabilitation.    CONSTIPATION  Constipation is defined medically as fewer than three stools per week and severe constipation as less than one stool per week.  Even if you have a regular bowel pattern at home, your normal regimen is likely to be disrupted due to multiple reasons following surgery.  Combination of anesthesia, postoperative narcotics, change in appetite and fluid intake all can affect your bowels.   YOU MUST use at least one of the following options; they are listed in order of increasing strength to get the job done.  They are all available over the counter, and you may need to use some, POSSIBLY even all of these options:    Drink plenty of fluids (prune juice may be helpful) and high fiber foods Colace 100 mg by mouth twice a day  Senokot for constipation as directed and as needed Dulcolax (bisacodyl), take with full glass of water  Miralax (polyethylene glycol) once or twice a day as needed.  If you have tried  all these things and are unable to have a bowel movement in the first 3-4 days after surgery call either your surgeon or your primary doctor.    If you experience loose stools or diarrhea, hold the medications until you stool forms back up.  If your symptoms do not get better within 1 week or if they get worse, check with your doctor.  If you experience "the worst abdominal pain ever" or develop nausea or vomiting, please contact the office immediately for further recommendations for treatment.   ITCHING:  If you experience itching with your medications, try taking only a single pain pill, or even half a pain pill at a time.  You can also use Benadryl over the counter for itching or also to help with sleep.   TED HOSE STOCKINGS:  Use stockings on both legs until for at least 2 weeks or as directed by physician office. They may be removed at night for sleeping.  MEDICATIONS:  See your medication summary on the "After Visit Summary" that nursing will review with you.  You may have some home medications which will be placed on hold until you complete the course of blood thinner medication.  It is important for you to complete the blood thinner medication as prescribed.  PRECAUTIONS:  If you experience chest pain or shortness of breath - call 911 immediately for transfer to the hospital emergency department.   If you develop a fever greater that 101 F, purulent drainage from wound, increased redness or drainage from wound, foul odor from the wound/dressing, or calf pain - CONTACT YOUR SURGEON.                                                   FOLLOW-UP APPOINTMENTS:  If you do not already have a post-op appointment, please call the office for an appointment to be seen by your surgeon.  Guidelines for how soon to be seen are listed in your "After Visit Summary", but are typically between 1-4 weeks after surgery.  OTHER INSTRUCTIONS:   Knee Replacement:  Do not place pillow under knee, focus on keeping  the knee straight while resting. CPM instructions: 0-90 degrees, 2 hours in the morning, 2 hours in the afternoon, and 2 hours in the evening. Place foam block, curve side up under heel at all times except when in CPM or when walking.  DO NOT modify, tear, cut, or change the foam block in any way.  POST-OPERATIVE OPIOID TAPER INSTRUCTIONS: It is important to wean off of your opioid medication as soon as possible. If you do not need pain medication after your surgery it is ok to  stop day one. Opioids include: Codeine, Hydrocodone(Norco, Vicodin), Oxycodone(Percocet, oxycontin) and hydromorphone amongst others.  Long term and even short term use of opiods can cause: Increased pain response Dependence Constipation Depression Respiratory depression And more.  Withdrawal symptoms can include Flu like symptoms Nausea, vomiting And more Techniques to manage these symptoms Hydrate well Eat regular healthy meals Stay active Use relaxation techniques(deep breathing, meditating, yoga) Do Not substitute Alcohol to help with tapering If you have been on opioids for less than two weeks and do not have pain than it is ok to stop all together.  Plan to wean off of opioids This plan should start within one week post op of your joint replacement. Maintain the same interval or time between taking each dose and first decrease the dose.  Cut the total daily intake of opioids by one tablet each day Next start to increase the time between doses. The last dose that should be eliminated is the evening dose.     MAKE SURE YOU:  Understand these instructions.  Get help right away if you are not doing well or get worse.    Thank you for letting us be a part of your medical care team.  It is a privilege we respect greatly.  We hope these instructions will help you stay on track for a fast and full recovery!   Increase activity slowly as tolerated   Complete by: As directed    Post-operative opioid taper  instructions:   Complete by: As directed    POST-OPERATIVE OPIOID TAPER INSTRUCTIONS: It is important to wean off of your opioid medication as soon as possible. If you do not need pain medication after your surgery it is ok to stop day one. Opioids include: Codeine, Hydrocodone(Norco, Vicodin), Oxycodone(Percocet, oxycontin) and hydromorphone amongst others.  Long term and even short term use of opiods can cause: Increased pain response Dependence Constipation Depression Respiratory depression And more.  Withdrawal symptoms can include Flu like symptoms Nausea, vomiting And more Techniques to manage these symptoms Hydrate well Eat regular healthy meals Stay active Use relaxation techniques(deep breathing, meditating, yoga) Do Not substitute Alcohol to help with tapering If you have been on opioids for less than two weeks and do not have pain than it is ok to stop all together.  Plan to wean off of opioids This plan should start within one week post op of your joint replacement. Maintain the same interval or time between taking each dose and first decrease the dose.  Cut the total daily intake of opioids by one tablet each day Next start to increase the time between doses. The last dose that should be eliminated is the evening dose.          DISCHARGE MEDICATIONS:   Allergies as of 10/21/2023       Reactions   Hydrochlorothiazide Other (See Comments)   Extremely severe headache    Cefpodoxime Other (See Comments)   Trouble breathing, fast/pounding heartbeat, nausea, blurry vision, slowed speech, stomach cramps, confusion, inability to lift head off pillow   Cefuroxime Other (See Comments)   Tachycardia, dark urine, nausea, severe diarrhea, stomach pain and cramping, severe listlessness, weakness, slurred speech, confusion, slept 14 hours, unable to walk alone   Cymbalta [duloxetine Hcl] Other (See Comments)   No appetite, inability to stay awake, nausea, severe itching,  dark urine, extremelty weak, shaky, severe headache   Serotonin Reuptake Inhibitors (ssris) Other (See Comments)   Nausea, dizziness, severe fatigue, anxiety, lethargy, HA, severe agitation,  excessive sleeping        Medication List     STOP taking these medications    cyclobenzaprine 10 MG tablet Commonly known as: FLEXERIL       TAKE these medications    acyclovir 400 MG tablet Commonly known as: ZOVIRAX Take 400 mg by mouth 2 (two) times daily.   amphetamine-dextroamphetamine 30 MG tablet Commonly known as: ADDERALL Take 15-30 mg by mouth 2 (two) times daily as needed (focusing). (Max 45 mg daily)   celecoxib 200 MG capsule Commonly known as: CeleBREX Take 1 capsule (200 mg total) by mouth 2 (two) times daily.   cetirizine 10 MG tablet Commonly known as: ZYRTEC Take 10 mg by mouth daily.   cloNIDine 0.1 MG tablet Commonly known as: CATAPRES Take 0.2 mg by mouth at bedtime.   dicyclomine 10 MG capsule Commonly known as: BENTYL Take 1 capsule (10 mg total) by mouth 4 (four) times daily -  before meals and at bedtime. What changed:  when to take this reasons to take this   Doxepin HCl 6 MG Tabs Take 12 mg by mouth at bedtime.   esomeprazole 20 MG capsule Commonly known as: NEXIUM Take 1 capsule (20 mg total) by mouth 2 (two) times daily.   gabapentin 300 MG capsule Commonly known as: NEURONTIN Take 900 mg by mouth at bedtime.   Humira (2 Pen) 40 MG/0.4ML pen Generic drug: adalimumab Inject 40 mg into the skin every 14 (fourteen) days.   HYDROmorphone 2 MG tablet Commonly known as: DILAUDID Take 1-2 tablets (2-4 mg total) by mouth every 6 (six) hours as needed for severe pain (pain score 7-10) (pain score 7-10).   hydrOXYzine 50 MG capsule Commonly known as: VISTARIL Take 1 capsule by mouth 4 (four) times daily.   lamoTRIgine 200 MG tablet Commonly known as: LAMICTAL Take 200 mg by mouth 2 (two) times daily.   lidocaine 5 % Commonly known as:  LIDODERM Place 1 patch onto the skin daily as needed (pain).   methocarbamol 500 MG tablet Commonly known as: ROBAXIN Take 1-2 tablets (500-1,000 mg total) by mouth every 6 (six) hours as needed for muscle spasms.   naloxone 4 MG/0.1ML Liqd nasal spray kit Commonly known as: NARCAN Place 0.4 mg into the nose as needed (opioid overdose).   ondansetron 8 MG tablet Commonly known as: ZOFRAN Take 8 mg by mouth every 8 (eight) hours as needed for nausea or vomiting.   Oxcarbazepine 300 MG tablet Commonly known as: TRILEPTAL Take 300 mg by mouth 2 (two) times daily.   Oxycodone HCl 10 MG Tabs Take 10 mg by mouth 5 (five) times daily.   pantoprazole 40 MG tablet Commonly known as: PROTONIX Take 1 tablet (40 mg total) by mouth 2 (two) times daily.   phentermine 37.5 MG tablet Commonly known as: ADIPEX-P Take 37.5 mg by mouth daily.   promethazine 25 MG tablet Commonly known as: PHENERGAN Take 1 tablet (25 mg total) by mouth every 6 (six) hours as needed for nausea or vomiting.   Sprix 15.75 MG/SPRAY Soln Generic drug: Ketorolac Tromethamine Place 1 spray into the nose as needed (pain).   traZODone 150 MG tablet Commonly known as: DESYREL Take 150 mg by mouth at bedtime.   Vitamin D (Ergocalciferol) 1.25 MG (50000 UNIT) Caps capsule Commonly known as: DRISDOL Take 50,000 Units by mouth every Sunday.   Xtampza ER 36 MG C12a Generic drug: oxyCODONE ER Take 36 mg by mouth in the morning and at  bedtime.   zolpidem 10 MG tablet Commonly known as: AMBIEN Take 10 mg by mouth at bedtime.               Durable Medical Equipment  (From admission, onward)           Start     Ordered   10/20/23 1259  DME Walker rolling  Once       Question:  Patient needs a walker to treat with the following condition  Answer:  S/P total knee replacement   10/20/23 1258   10/20/23 1259  DME 3 n 1  Once        10/20/23 1258   10/20/23 1259  DME Bedside commode  Once        Question:  Patient needs a bedside commode to treat with the following condition  Answer:  S/P total knee replacement   10/20/23 1258            FOLLOW UP VISIT:    DISPOSITION: HOME VS. SNF  Dental Antibiotics:  In most cases prophylactic antibiotics for Dental procdeures after total joint surgery are not necessary.  Exceptions are as follows:  1. History of prior total joint infection  2. Severely immunocompromised (Organ Transplant, cancer chemotherapy, Rheumatoid biologic meds such as Humera)  3. Poorly controlled diabetes (A1C &gt; 8.0, blood glucose over 200)  If you have one of these conditions, contact your surgeon for an antibiotic prescription, prior to your dental procedure.   CONDITION:  Good   Guy Sandifer 10/21/2023, 7:42 AM

## 2023-10-21 NOTE — Progress Notes (Signed)
SPORTS MEDICINE AND JOINT REPLACEMENT  Georgena Spurling, MD    Laurier Nancy, PA-C [address] [place], [place], [place]  [address]                             [phone]   PROGRESS NOTE  Subjective:  negative for Chest Pain  negative for Shortness of Breath  negative for Nausea/Vomiting   negative for Calf Pain  negative for Bowel Movement   Tolerating Diet: yes         Patient reports pain as 6 on 0-10 scale.    Objective: Vital signs in last 24 hours:   Patient Vitals for the past 24 hrs:  BP Temp Temp src Pulse Resp SpO2  10/21/23 0541 136/72 98 F (36.7 C) Oral 67 18 100 %  10/21/23 0219 (!) 143/67 98 F (36.7 C) Oral 71 17 98 %  10/20/23 2235 123/66 98 F (36.7 C) Oral 73 18 96 %  10/20/23 2126 137/78 -- -- -- -- --  10/20/23 1837 137/78 98.5 F (36.9 C) -- 77 18 94 %  10/20/23 1630 124/67 98.3 F (36.8 C) -- 85 18 97 %  10/20/23 1312 139/73 97.6 F (36.4 C) -- 81 18 97 %  10/20/23 1230 113/60 98.1 F (36.7 C) -- 74 12 96 %  10/20/23 1215 121/68 -- -- 79 11 97 %  10/20/23 1200 119/60 -- -- 82 14 93 %  10/20/23 1145 121/62 -- -- 84 12 100 %  10/20/23 1135 109/72 98 F (36.7 C) -- 86 19 100 %  10/20/23 1132 (!) 85/56 -- -- 87 (!) 30 100 %  10/20/23 0907 -- -- -- 81 -- 97 %  10/20/23 0906 -- -- -- 78 -- 98 %  10/20/23 0905 -- -- -- 78 -- 97 %  10/20/23 0904 -- -- -- 81 -- 98 %  10/20/23 0903 117/70 -- -- 76 -- 98 %  10/20/23 0902 -- -- -- 77 -- 98 %  10/20/23 0901 -- -- -- 76 -- 91 %    @flow {1959:LAST@   Intake/Output from previous day:   12/02 0701 - 12/03 0700 In: 2803.2 [P.O.:970; I.V.:1733.2] Out: 3025 [Urine:2925]   Intake/Output this shift:   No intake/output data recorded.   Intake/Output      12/02 0701 12/03 0700 12/03 0701 12/04 0700   P.O. 970    I.V. (mL/kg) 1733.2 (17.8)    IV Piggyback 100    Total Intake(mL/kg) 2803.2 (28.8)    Urine (mL/kg/hr) 2925 (1.3)    Blood 100    Total Output 3025    Net -221.8             LABORATORY DATA: Recent Labs    10/15/23 0939  WBC 15.9*  HGB 15.0  HCT 43.9  PLT 358   Recent Labs    10/15/23 0939  NA 131*  K 4.1  CL 94*  CO2 28  BUN 21*  CREATININE 0.78  GLUCOSE 101*  CALCIUM 9.2   No results found for: "INR", "PROTIME"  Examination:  General appearance: alert, cooperative, and no distress Extremities: extremities normal, atraumatic, no cyanosis or edema  Wound Exam: clean, dry, intact   Drainage:  None: wound tissue dry  Motor Exam: Quadriceps and Hamstrings Intact  Sensory Exam: Superficial Peroneal, Deep Peroneal, and Tibial normal   Assessment:    1 Day Post-Op  Procedure(s) (LRB): LEFT TOTAL KNEE ARTHROPLASTY (Left)  ADDITIONAL DIAGNOSIS:  Principal Problem:   S/P total knee replacement     Plan: Physical Therapy as ordered Weight Bearing as Tolerated (WBAT)  DVT Prophylaxis:  Aspirin  DISCHARGE PLAN: Home  Patient doing well, pain reasonably managed. Plan to D/C home after morning PT session.      Patient's anticipated LOS is less than 2 midnights, meeting these requirements: - Younger than 31 - Lives within 1 hour of care - Has a competent adult at home to recover with post-op recover - NO history of  - Chronic pain requiring opiods  - Diabetes  - Coronary Artery Disease  - Heart failure  - Heart attack  - Stroke  - DVT/VTE  - Cardiac arrhythmia  - Respiratory Failure/COPD  - Renal failure  - Anemia  - Advanced Liver disease      Guy Sandifer 10/21/2023, 7:34 AM

## 2023-10-21 NOTE — TOC Transition Note (Signed)
Transition of Care [facility]) - CM/SW Discharge Note   Patient Details  Name: Audrey Cannon MRN: [id number] Date of Birth: 04/11/1966  [facility] CM/SW Contact:  Amada Jupiter, LCSW Phone Number: 10/21/2023, 10:43 AM   Clinical Narrative:     Met with pt who confirms she has needed DME and OPPT already arranged with [facility] ([place]).  No further TOC needs.   Final next level of care: OP Rehab Barriers to Discharge: No Barriers Identified   Patient Goals and CMS Choice      Discharge Placement                         Discharge Plan and Services Additional resources added to the After Visit Summary for                  DME Arranged: N/A DME Agency: NA                  Social Determinants of Health (SDOH) Interventions SDOH Screenings   Food Insecurity: No Food Insecurity (10/20/2023)  Housing: Low Risk  (10/20/2023)  Transportation Needs: No Transportation Needs (10/20/2023)  Utilities: Not At Risk (10/20/2023)  Tobacco Use: Low Risk  (10/20/2023)     Readmission Risk Interventions     No data to display

## 2023-11-04 ENCOUNTER — Other Ambulatory Visit: Payer: Self-pay | Admitting: Gastroenterology

## 2023-12-31 NOTE — Progress Notes (Signed)
Please place orders in epic for preop.

## 2023-12-31 NOTE — Patient Instructions (Signed)
SURGICAL WAITING ROOM VISITATION  Patients having surgery or a procedure may have no more than 2 support people in the waiting area - these visitors may rotate.    Children under the age of 63 must have an adult with them who is not the patient.  Due to an increase in RSV and influenza rates and associated hospitalizations, children ages 43 and under may not visit patients in [facility] hospitals.  Visitors with respiratory illnesses are discouraged from visiting and should remain at home.  If the patient needs to stay at the hospital during part of their recovery, the visitor guidelines for inpatient rooms apply. Pre-op nurse will coordinate an appropriate time for 1 support person to accompany patient in pre-op.  This support person may not rotate.    Please refer to the [facility] website for the visitor guidelines for Inpatients (after your surgery is over and you are in a regular room).       Your procedure is scheduled on: 01-19-24    Report to [facility] Main Entrance    Report to admitting at     0515  AM   Call this number if you have problems the morning of surgery [phone]   Do not eat food :After Midnight.   After Midnight you may have the following liquids until __0415 ____ AM DAY OF SURGERY  then nothing by mouth  Water Non-Citrus Juices (without pulp, NO RED-Apple, White grape, White cranberry) Black Coffee (NO MILK/CREAM OR CREAMERS, sugar ok)  Clear Tea (NO MILK/CREAM OR CREAMERS, sugar ok) regular and decaf                             Plain Jell-O (NO RED)                                           Fruit ices (not with fruit pulp, NO RED)                                     Popsicles (NO RED)                                                               Sports drinks like Gatorade (NO RED)                   The day of surgery:  Drink ONE (1) Pre-Surgery Clear Ensure BY 0415  AM the morning of surgery. Drink in one sitting. Do not sip.  This  drink was given to you during your hospital  pre-op appointment visit. Nothing else to drink after completing the  Pre-Surgery Clear Ensure .          If you have questions, please contact your surgeon's office.   FOLLOW ANY ADDITIONAL PRE OP INSTRUCTIONS YOU RECEIVED FROM YOUR SURGEON'S OFFICE!!!     Oral Hygiene is also important to reduce your risk of infection.  Remember - BRUSH YOUR TEETH THE MORNING OF SURGERY WITH YOUR REGULAR TOOTHPASTE  DENTURES WILL BE REMOVED PRIOR TO SURGERY PLEASE DO NOT APPLY "Poly grip" OR ADHESIVES!!!   Do NOT smoke after Midnight   Stop all vitamins and herbal supplements 7 days before surgery.   Take these medicines the morning of surgery with A SIP OF WATER: zyrtec, nexium, pantoprazole, lamotrigine, trileptal, oxycodone if needed, Xtampza,hydroxyzine    Bring CPAP mask and tubing day of surgery.                              You may not have any metal on your body including hair pins, jewelry, and body piercing             Do not wear make-up, lotions, powders, perfumes/cologne, or deodorant  Do not wear nail polish including gel and S&S, artificial/acrylic nails, or any other type of covering on natural nails including finger and toenails. If you have artificial nails, gel coating, etc. that needs to be removed by a nail salon please have this removed prior to surgery or surgery may need to be canceled/ delayed if the surgeon/ anesthesia feels like they are unable to be safely monitored.   Do not shave 5 daysprior to surgery.              Do not bring valuables to the hospital. [facility] IS NOT             RESPONSIBLE   FOR VALUABLES.   Contacts, glasses, dentures or bridgework may not be worn into surgery.   Bring small overnight bag day of surgery.   DO NOT BRING YOUR HOME MEDICATIONS TO THE HOSPITAL. PHARMACY WILL DISPENSE MEDICATIONS LISTED ON YOUR MEDICATION LIST TO YOU DURING YOUR ADMISSION IN THE  HOSPITAL!    Patients discharged on the day of surgery will not be allowed to drive home.  Someone NEEDS to stay with you for the first 24 hours after anesthesia.   Special Instructions: Bring a copy of your healthcare power of attorney and living will documents the day of surgery if you haven't scanned them before.              Please read over the following fact sheets you were given: IF YOU HAVE QUESTIONS ABOUT YOUR PRE-OP INSTRUCTIONS PLEASE CALL [phone]   . If you test positive for Covid or have been in contact with anyone that has tested positive in the last 10 days please notify you surgeon.      Pre-operative 5 CHG Bath Instructions   You can play a key role in reducing the risk of infection after surgery. Your skin needs to be as free of germs as possible. You can reduce the number of germs on your skin by washing with CHG (chlorhexidine gluconate) soap before surgery. CHG is an antiseptic soap that kills germs and continues to kill germs even after washing.   DO NOT use if you have an allergy to chlorhexidine/CHG or antibacterial soaps. If your skin becomes reddened or irritated, stop using the CHG and notify one of our RNs at [phone].   Please shower with the CHG soap starting 4 days before surgery using the following schedule:     Please keep in mind the following:  DO NOT shave, including legs and underarms, starting the day of your first shower.   You may shave your face at any point before/day of  surgery.  Place clean sheets on your bed the day you start using CHG soap. Use a clean washcloth (not used since being washed) for each shower. DO NOT sleep with pets once you start using the CHG.   CHG Shower Instructions:  If you choose to wash your hair and private area, wash first with your normal shampoo/soap.  After you use shampoo/soap, rinse your hair and body thoroughly to remove shampoo/soap residue.  Turn the water OFF and apply about 3 tablespoons (45 ml)  of CHG soap to a CLEAN washcloth.  Apply CHG soap ONLY FROM YOUR NECK DOWN TO YOUR TOES (washing for 3-5 minutes)  DO NOT use CHG soap on face, private areas, open wounds, or sores.  Pay special attention to the area where your surgery is being performed.  If you are having back surgery, having someone wash your back for you may be helpful. Wait 2 minutes after CHG soap is applied, then you may rinse off the CHG soap.  Pat dry with a clean towel  Put on clean clothes/pajamas   If you choose to wear lotion, please use ONLY the CHG-compatible lotions on the back of this paper.     Additional instructions for the day of surgery: DO NOT APPLY any lotions, deodorants, cologne, or perfumes.   Put on clean/comfortable clothes.  Brush your teeth.  Ask your nurse before applying any prescription medications to the skin.      CHG Compatible Lotions   Aveeno Moisturizing lotion  Cetaphil Moisturizing Cream  Cetaphil Moisturizing Lotion  Clairol Herbal Essence Moisturizing Lotion, Dry Skin  Clairol Herbal Essence Moisturizing Lotion, Extra Dry Skin  Clairol Herbal Essence Moisturizing Lotion, Normal Skin  Curel Age Defying Therapeutic Moisturizing Lotion with Alpha Hydroxy  Curel Extreme Care Body Lotion  Curel Soothing Hands Moisturizing Hand Lotion  Curel Therapeutic Moisturizing Cream, Fragrance-Free  Curel Therapeutic Moisturizing Lotion, Fragrance-Free  Curel Therapeutic Moisturizing Lotion, Original Formula  Eucerin Daily Replenishing Lotion  Eucerin Dry Skin Therapy Plus Alpha Hydroxy Crme  Eucerin Dry Skin Therapy Plus Alpha Hydroxy Lotion  Eucerin Original Crme  Eucerin Original Lotion  Eucerin Plus Crme Eucerin Plus Lotion  Eucerin TriLipid Replenishing Lotion  Keri Anti-Bacterial Hand Lotion  Keri Deep Conditioning Original Lotion Dry Skin Formula Softly Scented  Keri Deep Conditioning Original Lotion, Fragrance Free Sensitive Skin Formula  Keri Lotion Fast Absorbing  Fragrance Free Sensitive Skin Formula  Keri Lotion Fast Absorbing Softly Scented Dry Skin Formula  Keri Original Lotion  Keri Skin Renewal Lotion Keri Silky Smooth Lotion  Keri Silky Smooth Sensitive Skin Lotion  Nivea Body Creamy Conditioning Oil  Nivea Body Extra Enriched Lotion  Nivea Body Original Lotion  Nivea Body Sheer Moisturizing Lotion Nivea Crme  Nivea Skin Firming Lotion  NutraDerm 30 Skin Lotion  NutraDerm Skin Lotion  NutraDerm Therapeutic Skin Cream  NutraDerm Therapeutic Skin Lotion  ProShield Protective Hand Cream   Incentive Spirometer  An incentive spirometer is a tool that can help keep your lungs clear and active. This tool measures how well you are filling your lungs with each breath. Taking long deep breaths may help reverse or decrease the chance of developing breathing (pulmonary) problems (especially infection) following: A long period of time when you are unable to move or be active. BEFORE THE PROCEDURE  If the spirometer includes an indicator to show your best effort, your nurse or respiratory therapist will set it to a desired goal. If possible, sit up straight or lean slightly forward.  Try not to slouch. Hold the incentive spirometer in an upright position. INSTRUCTIONS FOR USE  Sit on the edge of your bed if possible, or sit up as far as you can in bed or on a chair. Hold the incentive spirometer in an upright position. Breathe out normally. Place the mouthpiece in your mouth and seal your lips tightly around it. Breathe in slowly and as deeply as possible, raising the piston or the ball toward the top of the column. Hold your breath for 3-5 seconds or for as long as possible. Allow the piston or ball to fall to the bottom of the column. Remove the mouthpiece from your mouth and breathe out normally. Rest for a few seconds and repeat Steps 1 through 7 at least 10 times every 1-2 hours when you are awake. Take your time and take a few normal breaths  between deep breaths. The spirometer may include an indicator to show your best effort. Use the indicator as a goal to work toward during each repetition. After each set of 10 deep breaths, practice coughing to be sure your lungs are clear. If you have an incision (the cut made at the time of surgery), support your incision when coughing by placing a pillow or rolled up towels firmly against it. Once you are able to get out of bed, walk around indoors and cough well. You may stop using the incentive spirometer when instructed by your caregiver.  RISKS AND COMPLICATIONS Take your time so you do not get dizzy or light-headed. If you are in pain, you may need to take or ask for pain medication before doing incentive spirometry. It is harder to take a deep breath if you are having pain. AFTER USE Rest and breathe slowly and easily. It can be helpful to keep track of a log of your progress. Your caregiver can provide you with a simple table to help with this. If you are using the spirometer at home, follow these instructions: SEEK MEDICAL CARE IF:  You are having difficultly using the spirometer. You have trouble using the spirometer as often as instructed. Your pain medication is not giving enough relief while using the spirometer. You develop fever of 100.5 F (38.1 C) or higher. SEEK IMMEDIATE MEDICAL CARE IF:  You cough up bloody sputum that had not been present before. You develop fever of 102 F (38.9 C) or greater. You develop worsening pain at or near the incision site. MAKE SURE YOU:  Understand these instructions. Will watch your condition. Will get help right away if you are not doing well or get worse. Document Released: 03/17/2007 Document Revised: 01/27/2012 Document Reviewed: 05/18/2007 [facility] Patient Information 2014 [place], [place].   ________________________________________________________________________

## 2023-12-31 NOTE — Progress Notes (Addendum)
PCP - Nodal, Reinaldo, PA  LOV 09-19-23 Cardiologist - Belva Crome, MD  LOV 07-16-23  PPM/ICD -  Device Orders -  Rep Notified -   Chest x-ray -  EKG - 07-16-23 epic Stress Test -  ECHO - 08-15-23 epic Cardiac Cath -  CT coronary-08-15-23 HgbA1c- 12-23-23 epic 5.4  Sleep Study -  CPAP -   Fasting Blood Sugar -  Checks Blood Sugar _____ times a day  Blood Thinner Instructions: Aspirin Instructions:  ERAS Protcol - PRE-SURGERY Ensure    Phentermine stopped 01-02-24 COVID vaccine -yes  Activity--Able to complete ADL's without CP or SOB  Anesthesia review: Budd-  Charari syndrome, Aortic aneurysm repair, TBI, RA, HTN, Eosinophilic esophagitis, pt has major depressive disorder and gets ketamine during surgery to help with this., Alkaline phosphate elevated  Patient denies shortness of breath, fever, cough and chest pain at PAT appointment. Pt. Has Dry cough at preop stated it is allergies. Advised per Shanda Bumps Ward PA-C to reach out to surgeon if symptoms get worse.   All instructions explained to the patient, with a verbal understanding of the material. Patient agrees to go over the instructions while at home for a better understanding. Patient also instructed to self quarantine after being tested for COVID-19. The opportunity to ask questions was provided.

## 2024-01-02 NOTE — Progress Notes (Signed)
Sent message, via epic in basket, requesting orders in epic from Careers adviser.

## 2024-01-07 ENCOUNTER — Other Ambulatory Visit: Payer: Self-pay | Admitting: Orthopedic Surgery

## 2024-01-07 DIAGNOSIS — G8929 Other chronic pain: Secondary | ICD-10-CM

## 2024-01-09 ENCOUNTER — Encounter (HOSPITAL_COMMUNITY)
Admission: RE | Admit: 2024-01-09 | Discharge: 2024-01-09 | Disposition: A | Discharge status: Home / Self Care | Payer: Managed Care, Other (non HMO) | Source: Ambulatory Visit | Attending: Orthopedic Surgery | Admitting: Orthopedic Surgery

## 2024-01-09 ENCOUNTER — Other Ambulatory Visit: Payer: Self-pay

## 2024-01-09 ENCOUNTER — Encounter (HOSPITAL_COMMUNITY): Payer: Self-pay

## 2024-01-09 VITALS — BP 166/90 | HR 100 | Temp 98.1°F | Resp 16 | Ht 64.0 in | Wt 226.0 lb

## 2024-01-09 DIAGNOSIS — I820 Budd-Chiari syndrome: Secondary | ICD-10-CM | POA: Diagnosis not present

## 2024-01-09 DIAGNOSIS — M069 Rheumatoid arthritis, unspecified: Secondary | ICD-10-CM | POA: Insufficient documentation

## 2024-01-09 DIAGNOSIS — I10 Essential (primary) hypertension: Secondary | ICD-10-CM | POA: Diagnosis not present

## 2024-01-09 DIAGNOSIS — Z87820 Personal history of traumatic brain injury: Secondary | ICD-10-CM | POA: Insufficient documentation

## 2024-01-09 DIAGNOSIS — G8929 Other chronic pain: Secondary | ICD-10-CM | POA: Insufficient documentation

## 2024-01-09 DIAGNOSIS — Z96652 Presence of left artificial knee joint: Secondary | ICD-10-CM | POA: Diagnosis not present

## 2024-01-09 DIAGNOSIS — Z01812 Encounter for preprocedural laboratory examination: Secondary | ICD-10-CM | POA: Diagnosis present

## 2024-01-09 DIAGNOSIS — M1711 Unilateral primary osteoarthritis, right knee: Secondary | ICD-10-CM | POA: Insufficient documentation

## 2024-01-09 DIAGNOSIS — R7401 Elevation of levels of liver transaminase levels: Secondary | ICD-10-CM | POA: Diagnosis not present

## 2024-01-09 DIAGNOSIS — M25561 Pain in right knee: Secondary | ICD-10-CM | POA: Insufficient documentation

## 2024-01-09 DIAGNOSIS — M797 Fibromyalgia: Secondary | ICD-10-CM | POA: Insufficient documentation

## 2024-01-09 DIAGNOSIS — Z981 Arthrodesis status: Secondary | ICD-10-CM | POA: Diagnosis not present

## 2024-01-09 DIAGNOSIS — Z01818 Encounter for other preprocedural examination: Secondary | ICD-10-CM

## 2024-01-09 HISTORY — DX: Pneumonia, unspecified organism: J18.9

## 2024-01-09 HISTORY — DX: Eosinophilic esophagitis: K20.0

## 2024-01-09 LAB — COMPREHENSIVE METABOLIC PANEL
ALT: 74 U/L — ABNORMAL HIGH (ref 0–44)
AST: 54 U/L — ABNORMAL HIGH (ref 15–41)
Albumin: 3.9 g/dL (ref 3.5–5.0)
Alkaline Phosphatase: 296 U/L — ABNORMAL HIGH (ref 38–126)
Anion gap: 10 (ref 5–15)
BUN: 22 mg/dL — ABNORMAL HIGH (ref 6–20)
CO2: 27 mmol/L (ref 22–32)
Calcium: 9.2 mg/dL (ref 8.9–10.3)
Chloride: 96 mmol/L — ABNORMAL LOW (ref 98–111)
Creatinine, Ser: 0.7 mg/dL (ref 0.44–1.00)
GFR, Estimated: >60 mL/min (ref >60)
Glucose, Bld: 128 mg/dL — ABNORMAL HIGH (ref 70–99)
Potassium: 4.8 mmol/L (ref 3.5–5.1)
Sodium: 133 mmol/L — ABNORMAL LOW (ref 135–145)
Total Bilirubin: 0.6 mg/dL (ref 0.0–1.2)
Total Protein: 7.1 g/dL (ref 6.5–8.1)

## 2024-01-09 LAB — CBC WITH DIFFERENTIAL/PLATELET
Abs Immature Granulocytes: 0.02 10*3/uL (ref 0.00–0.07)
Basophils Absolute: 0.1 10*3/uL (ref 0.0–0.1)
Basophils Relative: 1 %
Eosinophils Absolute: 0.2 10*3/uL (ref 0.0–0.5)
Eosinophils Relative: 2 %
HCT: 46.3 % — ABNORMAL HIGH (ref 36.0–46.0)
Hemoglobin: 14.7 g/dL (ref 12.0–15.0)
Immature Granulocytes: 0 %
Lymphocytes Relative: 47 %
Lymphs Abs: 3.8 10*3/uL (ref 0.7–4.0)
MCH: 30.6 pg (ref 26.0–34.0)
MCHC: 31.7 g/dL (ref 30.0–36.0)
MCV: 96.3 fL (ref 80.0–100.0)
Monocytes Absolute: 1.2 10*3/uL — ABNORMAL HIGH (ref 0.1–1.0)
Monocytes Relative: 15 %
Neutro Abs: 2.8 10*3/uL (ref 1.7–7.7)
Neutrophils Relative %: 35 %
Platelets: 374 10*3/uL (ref 150–400)
RBC: 4.81 MIL/uL (ref 3.87–5.11)
RDW: 13.5 % (ref 11.5–15.5)
WBC: 8.1 10*3/uL (ref 4.0–10.5)
nRBC: 0 % (ref 0.0–0.2)

## 2024-01-09 LAB — SURGICAL PCR SCREEN
MRSA, PCR: NEGATIVE
Staphylococcus aureus: NEGATIVE

## 2024-01-12 NOTE — Anesthesia Preprocedure Evaluation (Addendum)
 Anesthesia Evaluation  Patient identified by MRN, date of birth, ID band Patient awake    Reviewed: Allergy & Precautions, NPO status , Patient's Chart, lab work & pertinent test results  History of Anesthesia Complications (+) PONV and history of anesthetic complications  Airway Mallampati: II  TM Distance: >3 FB Neck ROM: Full    Dental no notable dental hx.    Pulmonary neg pulmonary ROS   Pulmonary exam normal        Cardiovascular hypertension, Pt. on medications Normal cardiovascular exam  Moderate AI   Neuro/Psych  Headaches  Anxiety Depression Bipolar Disorder      GI/Hepatic Neg liver ROS,GERD  ,,  Endo/Other  Hypothyroidism    Renal/GU negative Renal ROS  negative genitourinary   Musculoskeletal  (+) Arthritis ,  Fibromyalgia -, narcotic dependent  Abdominal   Peds  Hematology negative hematology ROS (+)   Anesthesia Other Findings Day of surgery medications reviewed with patient.  Reproductive/Obstetrics negative OB ROS                              Anesthesia Physical Anesthesia Plan  ASA: 2  Anesthesia Plan: Spinal   Post-op Pain Management: Regional block* and Tylenol PO (pre-op)*   Induction:   PONV Risk Score and Plan: 3 and Ondansetron, Dexamethasone, Treatment may vary due to age or medical condition and Midazolam  Airway Management Planned: Natural Airway and Simple Face Mask  Additional Equipment: None  Intra-op Plan:   Post-operative Plan:   Informed Consent: I have reviewed the patients History and Physical, chart, labs and discussed the procedure including the risks, benefits and alternatives for the proposed anesthesia with the patient or authorized representative who has indicated his/her understanding and acceptance.       Plan Discussed with: CRNA  Anesthesia Plan Comments: (See PAT note from 2/21 by Sherlie Ban PA-C )         Anesthesia  Quick Evaluation
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
## 2024-01-19 ENCOUNTER — Inpatient Hospital Stay (HOSPITAL_COMMUNITY): Payer: Self-pay | Admitting: Physician Assistant

## 2024-01-19 ENCOUNTER — Other Ambulatory Visit: Payer: Self-pay

## 2024-01-19 ENCOUNTER — Encounter (HOSPITAL_COMMUNITY)
Admission: RE | Disposition: A | Discharge status: Home / Self Care | Payer: Self-pay | Source: Home / Self Care | Attending: Orthopedic Surgery

## 2024-01-19 ENCOUNTER — Encounter (HOSPITAL_COMMUNITY): Payer: Self-pay | Admitting: Orthopedic Surgery

## 2024-01-19 ENCOUNTER — Ambulatory Visit (HOSPITAL_COMMUNITY)
Admission: RE | Admit: 2024-01-19 | Discharge: 2024-01-19 | Disposition: A | Discharge status: Home / Self Care | Payer: Medicare Other | Attending: Orthopedic Surgery | Admitting: Orthopedic Surgery

## 2024-01-19 ENCOUNTER — Other Ambulatory Visit (HOSPITAL_COMMUNITY): Payer: Self-pay

## 2024-01-19 ENCOUNTER — Inpatient Hospital Stay (HOSPITAL_COMMUNITY): Payer: Self-pay | Admitting: Certified Registered"

## 2024-01-19 DIAGNOSIS — M1711 Unilateral primary osteoarthritis, right knee: Secondary | ICD-10-CM

## 2024-01-19 DIAGNOSIS — K219 Gastro-esophageal reflux disease without esophagitis: Secondary | ICD-10-CM | POA: Insufficient documentation

## 2024-01-19 DIAGNOSIS — F418 Other specified anxiety disorders: Secondary | ICD-10-CM | POA: Diagnosis not present

## 2024-01-19 DIAGNOSIS — M797 Fibromyalgia: Secondary | ICD-10-CM | POA: Diagnosis not present

## 2024-01-19 DIAGNOSIS — I10 Essential (primary) hypertension: Secondary | ICD-10-CM | POA: Diagnosis not present

## 2024-01-19 DIAGNOSIS — Z96652 Presence of left artificial knee joint: Secondary | ICD-10-CM | POA: Diagnosis not present

## 2024-01-19 DIAGNOSIS — E039 Hypothyroidism, unspecified: Secondary | ICD-10-CM | POA: Diagnosis not present

## 2024-01-19 DIAGNOSIS — Z6838 Body mass index (BMI) 38.0-38.9, adult: Secondary | ICD-10-CM | POA: Diagnosis not present

## 2024-01-19 DIAGNOSIS — Z96659 Presence of unspecified artificial knee joint: Principal | ICD-10-CM

## 2024-01-19 DIAGNOSIS — M069 Rheumatoid arthritis, unspecified: Secondary | ICD-10-CM | POA: Insufficient documentation

## 2024-01-19 HISTORY — PX: TOTAL KNEE ARTHROPLASTY: SHX125

## 2024-01-19 SURGERY — ARTHROPLASTY, KNEE, TOTAL
Anesthesia: Spinal | Site: Knee | Laterality: Right

## 2024-01-19 MED ORDER — TRANEXAMIC ACID-NACL 1000-0.7 MG/100ML-% IV SOLN
1000.0000 mg | INTRAVENOUS | Status: AC
  Administered 2024-01-19: 1000 mg via INTRAVENOUS
  Filled 2024-01-19: qty 100

## 2024-01-19 MED ORDER — FENTANYL CITRATE PF 50 MCG/ML IJ SOSY: 25.0000 ug | PREFILLED_SYRINGE | INTRAMUSCULAR | Status: DC | PRN

## 2024-01-19 MED ORDER — SODIUM CHLORIDE 0.9% FLUSH
INTRAVENOUS | Status: DC | PRN
  Administered 2024-01-19: 20 mL

## 2024-01-19 MED ORDER — GLYCOPYRROLATE PF 0.2 MG/ML IJ SOSY
PREFILLED_SYRINGE | INTRAMUSCULAR | Status: DC | PRN
  Administered 2024-01-19: .2 mg via INTRAVENOUS

## 2024-01-19 MED ORDER — LEVOFLOXACIN IN D5W 500 MG/100ML IV SOLN
500.0000 mg | INTRAVENOUS | Status: AC
  Administered 2024-01-19: 500 mg via INTRAVENOUS
  Filled 2024-01-19: qty 100

## 2024-01-19 MED ORDER — PHENYLEPHRINE HCL-NACL 20-0.9 MG/250ML-% IV SOLN
INTRAVENOUS | Status: AC
  Filled 2024-01-19: qty 250

## 2024-01-19 MED ORDER — POVIDONE-IODINE 10 % EX SWAB: 2.0000 | Freq: Once | CUTANEOUS | Status: DC

## 2024-01-19 MED ORDER — OXYCODONE HCL 5 MG/5ML PO SOLN: 10.0000 mg | Freq: Once | ORAL | Status: AC | PRN

## 2024-01-19 MED ORDER — METHOCARBAMOL 500 MG PO TABS
500.0000 mg | ORAL_TABLET | Freq: Four times a day (QID) | ORAL | Status: DC | PRN
  Administered 2024-01-19: 500 mg via ORAL

## 2024-01-19 MED ORDER — VANCOMYCIN HCL IN DEXTROSE 1-5 GM/200ML-% IV SOLN: 1000.0000 mg | INTRAVENOUS | Status: DC

## 2024-01-19 MED ORDER — PREDNISONE 5 MG PO TABS
5.0000 mg | ORAL_TABLET | Freq: Two times a day (BID) | ORAL | 0 refills | Status: AC
  Filled 2024-01-19: qty 20, 10d supply, fill #0

## 2024-01-19 MED ORDER — METHOCARBAMOL 500 MG PO TABS
500.0000 mg | ORAL_TABLET | Freq: Four times a day (QID) | ORAL | 0 refills | Status: AC
  Filled 2024-01-19: qty 60, 8d supply, fill #0

## 2024-01-19 MED ORDER — CHLORHEXIDINE GLUCONATE 0.12 % MT SOLN
15.0000 mL | Freq: Once | OROMUCOSAL | Status: AC
  Administered 2024-01-19: 15 mL via OROMUCOSAL

## 2024-01-19 MED ORDER — BUPIVACAINE LIPOSOME 1.3 % IJ SUSP
INTRAMUSCULAR | Status: DC | PRN
  Administered 2024-01-19: 20 mL

## 2024-01-19 MED ORDER — SODIUM CHLORIDE (PF) 0.9 % IJ SOLN
INTRAMUSCULAR | Status: AC
  Filled 2024-01-19: qty 20

## 2024-01-19 MED ORDER — ASPIRIN 81 MG PO TBEC
81.0000 mg | DELAYED_RELEASE_TABLET | Freq: Two times a day (BID) | ORAL | 0 refills | Status: AC
  Filled 2024-01-19: qty 60, 30d supply, fill #0

## 2024-01-19 MED ORDER — SODIUM CHLORIDE 0.9 % IR SOLN
Status: DC | PRN
  Administered 2024-01-19: 1000 mL

## 2024-01-19 MED ORDER — MIDAZOLAM HCL 2 MG/2ML IJ SOLN
INTRAMUSCULAR | Status: DC | PRN
  Administered 2024-01-19: 2 mg via INTRAVENOUS

## 2024-01-19 MED ORDER — OXYCODONE HCL 5 MG PO TABS
ORAL_TABLET | ORAL | Status: AC
  Filled 2024-01-19: qty 2

## 2024-01-19 MED ORDER — LACTATED RINGERS IV BOLUS
250.0000 mL | Freq: Once | INTRAVENOUS | Status: AC
  Administered 2024-01-19: 250 mL via INTRAVENOUS

## 2024-01-19 MED ORDER — HYDROMORPHONE HCL 2 MG PO TABS
ORAL_TABLET | ORAL | Status: AC
  Administered 2024-01-19: 2 mg via ORAL
  Filled 2024-01-19: qty 1

## 2024-01-19 MED ORDER — KETAMINE HCL 10 MG/ML IJ SOLN
INTRAMUSCULAR | Status: DC | PRN
  Administered 2024-01-19 (×3): 10 mg via INTRAVENOUS

## 2024-01-19 MED ORDER — BUPIVACAINE LIPOSOME 1.3 % IJ SUSP
INTRAMUSCULAR | Status: AC
  Filled 2024-01-19: qty 20

## 2024-01-19 MED ORDER — FENTANYL CITRATE (PF) 100 MCG/2ML IJ SOLN
INTRAMUSCULAR | Status: DC | PRN
  Administered 2024-01-19 (×2): 50 ug via INTRAVENOUS

## 2024-01-19 MED ORDER — ORAL CARE MOUTH RINSE: 15.0000 mL | Freq: Once | OROMUCOSAL | Status: AC

## 2024-01-19 MED ORDER — PROPOFOL 10 MG/ML IV BOLUS
INTRAVENOUS | Status: DC | PRN
  Administered 2024-01-19 (×3): 20 mg via INTRAVENOUS
  Administered 2024-01-19: 10 mg via INTRAVENOUS

## 2024-01-19 MED ORDER — DEXAMETHASONE SODIUM PHOSPHATE 10 MG/ML IJ SOLN
8.0000 mg | Freq: Once | INTRAMUSCULAR | Status: AC
Start: 2024-01-19 — End: 2024-01-19
  Administered 2024-01-19: 8 mg via INTRAVENOUS

## 2024-01-19 MED ORDER — KETAMINE HCL 50 MG/5ML IJ SOSY
PREFILLED_SYRINGE | INTRAMUSCULAR | Status: AC
  Filled 2024-01-19: qty 5

## 2024-01-19 MED ORDER — BUPIVACAINE-EPINEPHRINE (PF) 0.5% -1:200000 IJ SOLN
INTRAMUSCULAR | Status: DC | PRN
  Administered 2024-01-19: 15 mL via PERINEURAL

## 2024-01-19 MED ORDER — VANCOMYCIN HCL 1500 MG/300ML IV SOLN
1500.0000 mg | INTRAVENOUS | Status: AC
  Administered 2024-01-19: 1500 mg via INTRAVENOUS
  Filled 2024-01-19: qty 300

## 2024-01-19 MED ORDER — HYDROMORPHONE HCL 2 MG PO TABS
2.0000 mg | ORAL_TABLET | Freq: Four times a day (QID) | ORAL | 0 refills | Status: AC | PRN
  Filled 2024-01-19: qty 40, 5d supply, fill #0

## 2024-01-19 MED ORDER — BUPIVACAINE IN DEXTROSE 0.75-8.25 % IT SOLN
INTRATHECAL | Status: DC | PRN
  Administered 2024-01-19: 1.6 mL via INTRATHECAL

## 2024-01-19 MED ORDER — LACTATED RINGERS IV SOLN: INTRAVENOUS | Status: DC

## 2024-01-19 MED ORDER — METHOCARBAMOL 500 MG PO TABS
ORAL_TABLET | ORAL | Status: AC
  Filled 2024-01-19: qty 1

## 2024-01-19 MED ORDER — ACETAMINOPHEN 500 MG PO TABS
1000.0000 mg | ORAL_TABLET | Freq: Once | ORAL | Status: AC
  Administered 2024-01-19: 1000 mg via ORAL
  Filled 2024-01-19: qty 2

## 2024-01-19 MED ORDER — BUPIVACAINE-EPINEPHRINE (PF) 0.25% -1:200000 IJ SOLN
INTRAMUSCULAR | Status: AC
  Filled 2024-01-19: qty 30

## 2024-01-19 MED ORDER — DROPERIDOL 2.5 MG/ML IJ SOLN: 0.6250 mg | Freq: Once | INTRAMUSCULAR | Status: DC | PRN

## 2024-01-19 MED ORDER — BUPIVACAINE LIPOSOME 1.3 % IJ SUSP
20.0000 mL | Freq: Once | INTRAMUSCULAR | Status: DC
Start: 2024-01-19 — End: 2024-01-19

## 2024-01-19 MED ORDER — HYDROMORPHONE HCL 2 MG PO TABS: 1.0000 mg | ORAL_TABLET | ORAL | Status: DC | PRN

## 2024-01-19 MED ORDER — PROPOFOL 500 MG/50ML IV EMUL
INTRAVENOUS | Status: DC | PRN
  Administered 2024-01-19: 30 ug/kg/min via INTRAVENOUS

## 2024-01-19 MED ORDER — BUPIVACAINE-EPINEPHRINE 0.25% -1:200000 IJ SOLN
INTRAMUSCULAR | Status: DC | PRN
  Administered 2024-01-19: 30 mL

## 2024-01-19 MED ORDER — CLONIDINE HCL (ANALGESIA) 100 MCG/ML EP SOLN
EPIDURAL | Status: DC | PRN
  Administered 2024-01-19: 100 ug

## 2024-01-19 MED ORDER — MIDAZOLAM HCL 2 MG/2ML IJ SOLN
INTRAMUSCULAR | Status: AC
  Filled 2024-01-19: qty 2

## 2024-01-19 MED ORDER — GABAPENTIN 300 MG PO CAPS
300.0000 mg | ORAL_CAPSULE | Freq: Once | ORAL | Status: AC
  Administered 2024-01-19: 300 mg via ORAL
  Filled 2024-01-19: qty 1

## 2024-01-19 MED ORDER — ONDANSETRON HCL 4 MG/2ML IJ SOLN
INTRAMUSCULAR | Status: DC | PRN
  Administered 2024-01-19: 4 mg via INTRAVENOUS

## 2024-01-19 MED ORDER — OXYCODONE HCL 5 MG PO TABS
10.0000 mg | ORAL_TABLET | Freq: Once | ORAL | Status: AC | PRN
  Administered 2024-01-19: 10 mg via ORAL

## 2024-01-19 MED ORDER — METHOCARBAMOL 1000 MG/10ML IJ SOLN: 500.0000 mg | Freq: Four times a day (QID) | INTRAMUSCULAR | Status: DC | PRN

## 2024-01-19 MED ORDER — FENTANYL CITRATE (PF) 100 MCG/2ML IJ SOLN
INTRAMUSCULAR | Status: AC
  Filled 2024-01-19: qty 2

## 2024-01-19 MED ORDER — LACTATED RINGERS IV BOLUS
500.0000 mL | Freq: Once | INTRAVENOUS | Status: AC
  Administered 2024-01-19: 500 mL via INTRAVENOUS

## 2024-01-19 SURGICAL SUPPLY — 48 items
BAG COUNTER SPONGE SURGICOUNT (BAG) IMPLANT
BAG ZIPLOCK 12X15 (MISCELLANEOUS) ×1 IMPLANT
BLADE SAGITTAL 13X1.27X60 (BLADE) ×1 IMPLANT
BLADE SAW SGTL 18X1.27X75 (BLADE) ×1 IMPLANT
BLADE SURG 15 STRL LF DISP TIS (BLADE) ×1 IMPLANT
BNDG COHESIVE 4X5 TAN STRL LF (GAUZE/BANDAGES/DRESSINGS) IMPLANT
BNDG ELASTIC 6INX 5YD STR LF (GAUZE/BANDAGES/DRESSINGS) ×1 IMPLANT
BOWL SMART MIX CTS (DISPOSABLE) ×1 IMPLANT
CEMENT BONE R 1X40 (Cement) ×2 IMPLANT
COVER SURGICAL LIGHT HANDLE (MISCELLANEOUS) ×1 IMPLANT
CUFF TRNQT CYL 34X4.125X (TOURNIQUET CUFF) ×1 IMPLANT
DRAPE INCISE IOBAN 66X45 STRL (DRAPES) ×2 IMPLANT
DRAPE U-SHAPE 47X51 STRL (DRAPES) ×1 IMPLANT
DRSG AQUACEL AG ADV 3.5X10 (GAUZE/BANDAGES/DRESSINGS) ×1 IMPLANT
DURAPREP 26ML APPLICATOR (WOUND CARE) ×2 IMPLANT
ELECT REM PT RETURN 15FT ADLT (MISCELLANEOUS) ×1 IMPLANT
FEMUR CMT CCR STD SZ7 R KNEE (Knees) ×1 IMPLANT
FEMUR CMTD CCR STD SZ7 R KNEE (Knees) IMPLANT
GLOVE BIOGEL M 7.0 STRL (GLOVE) IMPLANT
GLOVE BIOGEL PI IND STRL 7.5 (GLOVE) IMPLANT
GLOVE BIOGEL PI IND STRL 8.5 (GLOVE) ×1 IMPLANT
GLOVE SURG ORTHO 8.0 STRL STRW (GLOVE) ×2 IMPLANT
GOWN STRL REUS W/ TWL XL LVL3 (GOWN DISPOSABLE) ×2 IMPLANT
HOLDER FOLEY CATH W/STRAP (MISCELLANEOUS) ×1 IMPLANT
HOOD PEEL AWAY T7 (MISCELLANEOUS) ×3 IMPLANT
INSERT TIBIAL 13 PLY R 6-9CD (Joint) IMPLANT
KIT TURNOVER KIT A (KITS) IMPLANT
MANIFOLD NEPTUNE II (INSTRUMENTS) ×1 IMPLANT
NS IRRIG 1000ML POUR BTL (IV SOLUTION) ×1 IMPLANT
PACK TOTAL KNEE CUSTOM (KITS) ×1 IMPLANT
PROTECTOR NERVE ULNAR (MISCELLANEOUS) ×1 IMPLANT
SET HNDPC FAN SPRY TIP SCT (DISPOSABLE) ×1 IMPLANT
SPIKE FLUID TRANSFER (MISCELLANEOUS) ×2 IMPLANT
STEM POLY PAT PLY 29M KNEE (Knees) IMPLANT
STEM TIBIA 5 DEG SZ D R KNEE (Knees) IMPLANT
STRIP CLOSURE SKIN 1/2X4 (GAUZE/BANDAGES/DRESSINGS) ×1 IMPLANT
SUT BONE WAX W31G (SUTURE) ×1 IMPLANT
SUT MNCRL AB 3-0 PS2 18 (SUTURE) ×1 IMPLANT
SUT STRATAFIX 0 PDS 27 VIOLET (SUTURE) ×1 IMPLANT
SUT STRATAFIX 1PDS 45CM VIOLET (SUTURE) ×1 IMPLANT
SUT VIC AB 1 CT1 36 (SUTURE) ×1 IMPLANT
SUTURE STRATFX 0 PDS 27 VIOLET (SUTURE) ×1 IMPLANT
TIBIA STEM 5 DEG SZ D R KNEE (Knees) ×1 IMPLANT
TRAY FOLEY MTR SLVR 14FR STAT (SET/KITS/TRAYS/PACK) IMPLANT
TRAY FOLEY MTR SLVR 16FR STAT (SET/KITS/TRAYS/PACK) ×1 IMPLANT
TUBE SUCTION HIGH CAP CLEAR NV (SUCTIONS) ×1 IMPLANT
WATER STERILE IRR 1000ML POUR (IV SOLUTION) ×2 IMPLANT
WRAP KNEE MAXI GEL POST OP (GAUZE/BANDAGES/DRESSINGS) ×1 IMPLANT

## 2024-01-19 NOTE — Anesthesia Postprocedure Evaluation (Signed)
 Anesthesia Post Note  Patient: Audrey Cannon  Procedure(s) Performed: TOTAL KNEE ARTHROPLASTY (Right: Knee)     Patient location during evaluation: PACU Anesthesia Type: Spinal Level of consciousness: awake and alert Pain management: pain level controlled Vital Signs Assessment: post-procedure vital signs reviewed and stable Respiratory status: spontaneous breathing, nonlabored ventilation and respiratory function stable Cardiovascular status: blood pressure returned to baseline Postop Assessment: no apparent nausea or vomiting, spinal receding, no headache and no backache Anesthetic complications: no   No notable events documented.  Last Vitals:  Vitals:   01/19/24 0945 01/19/24 1015  BP: 127/62   Pulse: 75   Resp: 16   Temp:    SpO2: 97% 94%    Last Pain:  Vitals:   01/19/24 1015  TempSrc:   PainSc: 0-No pain                 Shanda Howells
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
## 2024-01-19 NOTE — Transfer of Care (Signed)
 Immediate Anesthesia Transfer of Care Note  Patient: Audrey Cannon  Procedure(s) Performed: TOTAL KNEE ARTHROPLASTY (Right: Knee)  Patient Location: PACU  Anesthesia Type:Spinal and MAC combined with regional for post-op pain  Level of Consciousness: awake, alert , oriented, and patient cooperative  Airway & Oxygen Therapy: Patient Spontanous Breathing and Patient connected to face mask oxygen  Post-op Assessment: Report given to RN and Post -op Vital signs reviewed and stable  Post vital signs: Reviewed and stable  Last Vitals:  Vitals Value Taken Time  BP 131/65 01/19/24 0905  Temp    Pulse 84 01/19/24 0907  Resp 12 01/19/24 0907  SpO2 98 % 01/19/24 0907  Vitals shown include unfiled device data.  Last Pain:  Vitals:   01/19/24 8119  TempSrc: Oral  PainSc:          Complications: No notable events documented.

## 2024-01-19 NOTE — Progress Notes (Signed)
 Orthopedic Tech Progress Note Patient Details:  Audrey Cannon 09/05/1966 [id number]  Ortho Devices Type of Ortho Device: Bone foam zero knee Ortho Device/Splint Location: RLE Ortho Device/Splint Interventions: Ordered, Application, Adjustment   Post Interventions Patient Tolerated: Well Instructions Provided: Care of device Bone foam applied, CPM cannot be due to patient being on a stretcher and not a hospital bed. Darleen Crocker 01/19/2024, 9:20 AM

## 2024-01-19 NOTE — Evaluation (Signed)
 Physical Therapy Evaluation Patient Details Name: Audrey Cannon MRN: [id number] DOB: 08-15-1966 Today's Date: 01/19/2024  History of Present Illness  58 yo female presents to therapy s/p R TKA on 01/19/2024 due to failure of conservative measures. Pt PMH includes but is not limited to: HTN, GERD, RA, aortic aneurysm repair, TBI, depression, bipolar, anxiety, PTSD, chronic LBP, Budd-Charari syndrome, DDD of cervical region s/p fusion, fibromyalgia, R tibial shaft fx s/p ORIF, hardware removal 09/04/2023 and  L TKA on 10/20/2023.  Clinical Impression    Audrey Cannon is a 58 y.o. female POD 0 s/p R TKA. Patient reports mod I for short amb bouts with rollator and required A for ADLs and self care tasks, motorized scooter for community distances with mobility at baseline. Patient is now limited by functional impairments (see PT problem list below) and requires CGA and cues for transfers and gait with RW. Patient was able to ambulate 40 feet with RW and CGA and progressing to close S and cues for safe walker management. Patient educated on safe sequencing for functional mobility tasks, fall risk prevention, pain management and goal, use of CP/ice pt and spouse verbalized understanding of safe guarding position for people assisting with mobility. Patient instructed in exercises to facilitate ROM and circulation reviewed and HO provided. Pt is motivated to d/c home despite pain report of 8/10 and indicated she did not need therapy in order to transition home and required encouragement and education for PACU d/c protocol following total joint replacement. Pt reported she was not going to start OPPT until 2 wks following surgery and had her own program and exercises to perform. Patient will benefit from continued skilled PT interventions to address impairments and progress towards PLOF. Patient has met mobility goals at adequate level for discharge home with family support; will continue to follow if pt continues  acute stay to progress towards Mod I goals.       If plan is discharge home, recommend the following: A little help with walking and/or transfers;A little help with bathing/dressing/bathroom;Assistance with cooking/housework;Assist for transportation   Can travel by private vehicle        Equipment Recommendations None recommended by PT  Recommendations for Other Services       Functional Status Assessment Patient has had a recent decline in their functional status and demonstrates the ability to make significant improvements in function in a reasonable and predictable amount of time.     Precautions / Restrictions Precautions Precautions: Knee;Fall Restrictions Weight Bearing Restrictions Per Provider Order: No      Mobility  Bed Mobility Overal bed mobility: Needs Assistance Bed Mobility: Sit to Supine       Sit to supine: Supervision, HOB elevated, Used rails   General bed mobility comments: min cues    Transfers Overall transfer level: Needs assistance Equipment used: Rolling walker (2 wheels) Transfers: Sit to/from Stand Sit to Stand: Contact guard assist           General transfer comment: min cues for proper UE and AD placement    Ambulation/Gait Ambulation/Gait assistance: Contact guard assist Gait Distance (Feet): 40 Feet Assistive device: Rolling walker (2 wheels) Gait Pattern/deviations: Step-to pattern, Antalgic, Trunk flexed Gait velocity: decreased     General Gait Details: B UE support at RW to offload R LE in stance phase, step to pattern with no reports of change in pain 8/10  Stairs            Wheelchair Mobility     Tilt  Bed    Modified Rankin (Stroke Patients Only)       Balance Overall balance assessment: Needs assistance Sitting-balance support: Feet supported Sitting balance-Leahy Scale: Good     Standing balance support: Bilateral upper extremity supported, During functional activity, Reliant on assistive  device for balance Standing balance-Leahy Scale: Fair Standing balance comment: static standing no UE support                             Pertinent Vitals/Pain Pain Assessment Pain Assessment: 0-10 Pain Score: 8  Pain Location: R knee and LE Pain Descriptors / Indicators: Aching, Constant, Discomfort, Dull, Grimacing, Numbness, Crying Pain Intervention(s): Limited activity within patient's tolerance    Home Living Family/patient expects to be discharged to:: Private residence Living Arrangements: Spouse/significant other Available Help at Discharge: Family Type of Home: House Home Access: Stairs to enter;Ramped entrance Entrance Stairs-Rails: Right;Left;Can reach both Entrance Stairs-Number of Steps: 10 small steps   Home Layout: One level Home Equipment: Agricultural consultant (2 wheels);Cane - single point;Grab bars - toilet;Shower seat;Grab bars - tub/shower;Rollator (4 wheels);Electric scooter;Wheelchair - manual;Crutches      Prior Function Prior Level of Function : Needs assist       Physical Assist : ADLs (physical)     Mobility Comments: rollator for household mobility and electric scooter for longer community distances, pt required A for shower transfers, required A for ADLs and IADLs ADLs Comments: limited standing tolerance to 2 mins at baseline, sits in wc for ADLs and use wc for some household mobility is able to self propel with b LEs, pt states required A for ADLs, shower transfers     Extremity/Trunk Assessment        Lower Extremity Assessment Lower Extremity Assessment: RLE deficits/detail RLE Deficits / Details: ankle DF/PF 5/5; SLR < 10 degree lag RLE Sensation: decreased light touch (pt reports R LE numbness and tingling)    Cervical / Trunk Assessment Cervical / Trunk Assessment: Back Surgery  Communication   Communication Communication: No apparent difficulties    Cognition Arousal: Alert Behavior During Therapy: WFL for tasks  assessed/performed   PT - Cognitive impairments: No apparent impairments                         Following commands: Intact       Cueing       General Comments      Exercises Total Joint Exercises Ankle Circles/Pumps: AROM, 10 reps, Both Quad Sets: AROM, Right, 5 reps Short Arc Quad: AROM, Right, 5 reps Heel Slides: AROM, Right, 5 reps Hip ABduction/ADduction: AROM, Right, 5 reps Knee Flexion: AROM, Right, 5 reps, Seated   Assessment/Plan    PT Assessment Patient needs continued PT services  PT Problem List Decreased strength;Decreased range of motion;Decreased activity tolerance;Decreased balance;Decreased mobility;Decreased coordination;Pain       PT Treatment Interventions DME instruction;Gait training;Functional mobility training;Therapeutic activities;Therapeutic exercise;Balance training;Neuromuscular re-education;Patient/family education;Modalities    PT Goals (Current goals can be found in the Care Plan section)  Acute Rehab PT Goals Patient Stated Goal: to be able to walk on the [place] or [place] do all the things I have not been able to do in a long time including walk in our woods and across the yard PT Goal Formulation: With patient Time For Goal Achievement: 02/02/24 Potential to Achieve Goals: Good    Frequency 7X/week     Co-evaluation  AM-PAC PT "6 Clicks" Mobility  Outcome Measure Help needed turning from your back to your side while in a flat bed without using bedrails?: None Help needed moving from lying on your back to sitting on the side of a flat bed without using bedrails?: A Little Help needed moving to and from a bed to a chair (including a wheelchair)?: A Little Help needed standing up from a chair using your arms (e.g., wheelchair or bedside chair)?: A Little Help needed to walk in hospital room?: A Little Help needed climbing 3-5 steps with a railing? : Total 6 Click Score: 17    End of Session  Equipment Utilized During Treatment: Gait belt Activity Tolerance: Patient limited by pain Patient left: in chair;with call bell/phone within reach;with family/visitor present;with nursing/sitter in room Nurse Communication: Mobility status;Other (comment) (pt readiness for d/c from PT standpoint) PT Visit Diagnosis: Unsteadiness on feet (R26.81);Other abnormalities of gait and mobility (R26.89);Muscle weakness (generalized) (M62.81);Difficulty in walking, not elsewhere classified (R26.2);Pain Pain - Right/Left: Right Pain - part of body: Knee;Leg (back)    Time: 1610-9604 PT Time Calculation (min) (ACUTE ONLY): 41 min   Charges:   PT Evaluation $PT Eval Low Complexity: 1 Low PT Treatments $Gait Training: 8-22 mins $Therapeutic Exercise: 8-22 mins PT General Charges $$ ACUTE PT VISIT: 1 Visit         Johnny Bridge, PT Acute Rehab   Jacqualyn Posey 01/19/2024, 12:47 PM

## 2024-01-19 NOTE — Anesthesia Procedure Notes (Signed)
 Spinal  Patient location during procedure: OR Start time: 01/19/2024 7:25 AM End time: 01/19/2024 7:28 AM Reason for block: surgical anesthesia Staffing Performed: anesthesiologist  Anesthesiologist: Kaylyn Layer, MD Performed by: Kaylyn Layer, MD Authorized by: Kaylyn Layer, MD   Preanesthetic Checklist Completed: patient identified, IV checked, risks and benefits discussed, surgical consent, monitors and equipment checked, pre-op evaluation and timeout performed Spinal Block Patient position: sitting Prep: DuraPrep and site prepped and draped Patient monitoring: continuous pulse ox, blood pressure and heart rate Approach: midline Location: L3-4 Injection technique: single-shot Needle Needle type: Pencan  Needle gauge: 24 G Needle length: 9 cm Assessment Events: CSF return Additional Notes Risks, benefits, and alternative discussed. Patient gave consent to procedure. Prepped and draped in sitting position. Patient sedated but responsive to voice. Clear CSF obtained after one needle redirection. Free drip of CSF but unable to aspirate after injection. No pain or paraesthesias with injection. Patient tolerated procedure well. Vital signs stable. Amalia Greenhouse, MD

## 2024-01-19 NOTE — Anesthesia Procedure Notes (Signed)
 Anesthesia Regional Block: Adductor canal block   Pre-Anesthetic Checklist: , timeout performed,  Correct Patient, Correct Site, Correct Laterality,  Correct Procedure, Correct Position, site marked,  Risks and benefits discussed,  Pre-op evaluation,  At surgeon's request and post-op pain management  Laterality: Right  Prep: Maximum Sterile Barrier Precautions used, chloraprep       Needles:  Injection technique: Single-shot  Needle Type: Echogenic Stimulator Needle     Needle Length: 9cm  Needle Gauge: 22     Additional Needles:   Procedures:,,,, ultrasound used (permanent image in chart),,    Narrative:  Start time: 01/19/2024 7:01 AM End time: 01/19/2024 7:04 AM Injection made incrementally with aspirations every 5 mL.  Performed by: Personally  Anesthesiologist: Kaylyn Layer, MD  Additional Notes: Risks, benefits, and alternative discussed. Patient gave consent for procedure. Patient prepped and draped in sterile fashion. Sedation administered, patient remains easily responsive to voice. Relevant anatomy identified with ultrasound guidance. Local anesthetic given in 5cc increments with no signs or symptoms of intravascular injection. No pain or paraesthesias with injection. Patient monitored throughout procedure with signs of LAST or immediate complications. Tolerated well. Ultrasound image placed in chart.  Amalia Greenhouse, MD

## 2024-01-19 NOTE — Op Note (Signed)
 TOTAL KNEE REPLACEMENT OPERATIVE NOTE:  01/19/2024  11:40 AM  PATIENT:  Audrey Cannon  58 y.o. female  PRE-OPERATIVE DIAGNOSIS:  Osteoarthritis right knee  M17.11  POST-OPERATIVE DIAGNOSIS:  Osteoarthritis right knee M17.11  PROCEDURE:  Procedure(s): TOTAL KNEE ARTHROPLASTY  SURGEON:  Surgeon(s): Dannielle Huh, MD  PHYSICIAN ASSISTANT: Laurier Nancy, PA-C   ANESTHESIA:   spinal  SPECIMEN: None  COUNTS:  Correct  TOURNIQUET:   Total Tourniquet Time Documented: Thigh (Right) - 44 minutes Total: Thigh (Right) - 44 minutes   DICTATION:  Indication for procedure:    The patient is a 58 y.o. female who has failed conservative treatment for Osteoarthritis right knee  M17.11.  Informed consent was obtained prior to anesthesia. The risks versus benefits of the operation were explain and in a way the patient can, and did, understand.    Description of procedure:     The patient was taken to the operating room and placed under anesthesia.  The patient was positioned in the usual fashion taking care that all body parts were adequately padded and/or protected.  A tourniquet was applied and the leg prepped and draped in the usual sterile fashion.  The extremity was exsanguinated with the esmarch and tourniquet inflated to 300 mmHg.  Pre-operative range of motion was normal.    A midline incision approximately 6-7 inches long was made with a #10 blade.  A new blade was used to make a parapatellar arthrotomy going 2-3 cm into the quadriceps tendon, over the patella, and alongside the medial aspect of the patellar tendon.  A synovectomy was then performed with the #10 blade and forceps. I then elevated the deep MCL off the medial tibial metaphysis subperiosteally around to the semimembranosus attachment.    I everted the patella and used calipers to measure patellar thickness.  I used the reamer to ream down to appropriate thickness to recreate the native thickness.  I then removed excess  bone with the rongeur and sagittal saw.  I used the appropriately sized template and drilled the three lug holes.  I then put the trial in place and measured the thickness with the calipers to ensure recreation of the native thickness.  The trial was then removed and the patella subluxed and the knee brought into flexion.  A homan retractor was place to retract and protect the patella and lateral structures.  A Z-retractor was place medially to protect the medial structures.  The extra-medullary alignment system was used to make cut the tibial articular surface perpendicular to the anamotic axis of the tibia and in 3 degrees of posterior slope.  The cut surface and alignment jig was removed.  I then used the intramedullary alignment guide to make a 5 valgus cut on the distal femur.  I then marked out the epicondylar axis on the distal femur.   I then used the anterior referencing sizer and measured the femur to be a size 7.  The 4-In-1 cutting block was screwed into place in external rotation matching the posterior condylar angle, making our cuts perpendicular to the epicondylar axis.  Anterior, posterior and chamfer cuts were made with the sagittal saw.  The cutting block and cut pieces were removed.  A lamina spreader was placed in 90 degrees of flexion.  The ACL, PCL, menisci, and posterior condylar osteophytes were removed.  A 13 mm spacer blocked was found to offer good flexion and extension gap balance after minimal in degree releasing.   The scoop retractor was then placed  and the femoral finishing block was pinned in place.  The small sagittal saw was used as well as the lug drill to finish the femur.  The block and cut surfaces were removed and the medullary canal hole filled with autograft bone from the cut pieces.  The tibia was delivered forward in deep flexion and external rotation.  A size D tray was selected and pinned into place centered on the medial 1/3 of the tibial tubercle.  The reamer  and keel was used to prepare the tibia through the tray.    I then trialed with the size 7 femur, size D tibia, a 13 mm insert and the 29 patella.  I had excellent flexion/extension gap balance, excellent patella tracking.  Flexion was full and beyond 120 degrees; extension was zero.  These components were chosen and the staff opened them to me on the back table while the knee was lavaged copiously and the cement mixed.  The soft tissue was infiltrated with 60cc of exparel 1.3% through a 21 gauge needle.  I cemented in the components and removed all excess cement.  The polyethylene tibial component was snapped into place and the knee placed in extension while cement was hardening.  The capsule was infilltrated with a 60cc exparel/marcaine/saline mixture.   Once the cement was hard, the tourniquet was let down.  Hemostasis was obtained.  The arthrotomy was closed using a #1 stratofix running suture.  The deep soft tissues were closed with #0 vicryls and the subcuticular layer closed with #2-0 vicryl.  The skin was reapproximated and closed with 3.0 Monocryl.  The wound was covered with steristrips, aquacel dressing, and a TED stocking.   The patient was then awakened, extubated, and taken to the recovery room in stable condition.  BLOOD LOSS:  300cc COMPLICATIONS:  None.  PLAN OF CARE: Discharge to home after PACU  PATIENT DISPOSITION:  PACU - hemodynamically stable.   Delay start of Pharmacological VTE agent (>24hrs) due to surgical blood loss or risk of bleeding:  yes  Please fax a copy of this op note to my office at [phone] (please only include page 1 and 2 of the Case Information op note)

## 2024-01-20 ENCOUNTER — Encounter (HOSPITAL_COMMUNITY): Payer: Self-pay | Admitting: Orthopedic Surgery
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
## 2024-06-03 ENCOUNTER — Encounter: Payer: Self-pay | Admitting: Cardiology

## 2024-06-15 ENCOUNTER — Other Ambulatory Visit: Payer: Self-pay | Admitting: Gastroenterology
